# Patient Record
Sex: Female | Born: 1965 | Race: Black or African American | Hispanic: No | Marital: Married | Smoking: Never smoker
Health system: Southern US, Community
[De-identification: ages and names within clinical notes are randomized; demographics above are authoritative.]

## PROBLEM LIST (undated history)

## (undated) DIAGNOSIS — R519 Headache, unspecified: Secondary | ICD-10-CM

## (undated) DIAGNOSIS — Z923 Personal history of irradiation: Secondary | ICD-10-CM

## (undated) DIAGNOSIS — N939 Abnormal uterine and vaginal bleeding, unspecified: Secondary | ICD-10-CM

## (undated) DIAGNOSIS — R011 Cardiac murmur, unspecified: Secondary | ICD-10-CM

## (undated) DIAGNOSIS — R51 Headache: Secondary | ICD-10-CM

## (undated) DIAGNOSIS — G4730 Sleep apnea, unspecified: Secondary | ICD-10-CM

## (undated) DIAGNOSIS — G4733 Obstructive sleep apnea (adult) (pediatric): Secondary | ICD-10-CM

## (undated) DIAGNOSIS — F32A Depression, unspecified: Secondary | ICD-10-CM

## (undated) DIAGNOSIS — F329 Major depressive disorder, single episode, unspecified: Secondary | ICD-10-CM

## (undated) DIAGNOSIS — Z803 Family history of malignant neoplasm of breast: Secondary | ICD-10-CM

## (undated) DIAGNOSIS — G43909 Migraine, unspecified, not intractable, without status migrainosus: Secondary | ICD-10-CM

## (undated) DIAGNOSIS — G5600 Carpal tunnel syndrome, unspecified upper limb: Secondary | ICD-10-CM

## (undated) DIAGNOSIS — I10 Essential (primary) hypertension: Secondary | ICD-10-CM

## (undated) DIAGNOSIS — D649 Anemia, unspecified: Secondary | ICD-10-CM

## (undated) DIAGNOSIS — C801 Malignant (primary) neoplasm, unspecified: Secondary | ICD-10-CM

## (undated) HISTORY — PX: ABLATION: SHX5711

## (undated) HISTORY — PX: OTHER SURGICAL HISTORY: SHX169

## (undated) HISTORY — DX: Depression, unspecified: F32.A

## (undated) HISTORY — DX: Headache: R51

## (undated) HISTORY — DX: Essential (primary) hypertension: I10

## (undated) HISTORY — DX: Migraine, unspecified, not intractable, without status migrainosus: G43.909

## (undated) HISTORY — DX: Cardiac murmur, unspecified: R01.1

## (undated) HISTORY — DX: Family history of malignant neoplasm of breast: Z80.3

## (undated) HISTORY — DX: Headache, unspecified: R51.9

## (undated) HISTORY — DX: Anemia, unspecified: D64.9

## (undated) HISTORY — DX: Sleep apnea, unspecified: G47.30

## (undated) HISTORY — DX: Carpal tunnel syndrome, unspecified upper limb: G56.00

## (undated) HISTORY — DX: Obstructive sleep apnea (adult) (pediatric): G47.33

---
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
## 1998-07-08 ENCOUNTER — Other Ambulatory Visit: Admission: RE | Admit: 1998-07-08 | Discharge: 1998-07-08 | Payer: Self-pay | Admitting: Obstetrics and Gynecology

## 1999-08-02 ENCOUNTER — Other Ambulatory Visit: Admission: RE | Admit: 1999-08-02 | Discharge: 1999-08-02 | Payer: Self-pay | Admitting: Obstetrics and Gynecology

## 2000-07-04 ENCOUNTER — Emergency Department (HOSPITAL_COMMUNITY): Admission: EM | Admit: 2000-07-04 | Discharge: 2000-07-04 | Payer: Self-pay | Admitting: Emergency Medicine

## 2000-07-04 ENCOUNTER — Encounter: Payer: Self-pay | Admitting: Emergency Medicine

## 2000-09-05 ENCOUNTER — Other Ambulatory Visit: Admission: RE | Admit: 2000-09-05 | Discharge: 2000-09-05 | Payer: Self-pay | Admitting: Obstetrics and Gynecology

## 2001-10-15 ENCOUNTER — Other Ambulatory Visit: Admission: RE | Admit: 2001-10-15 | Discharge: 2001-10-15 | Payer: Self-pay | Admitting: Obstetrics and Gynecology

## 2001-12-27 ENCOUNTER — Encounter (INDEPENDENT_AMBULATORY_CARE_PROVIDER_SITE_OTHER): Payer: Self-pay | Admitting: *Deleted

## 2001-12-27 ENCOUNTER — Ambulatory Visit (HOSPITAL_COMMUNITY): Admission: RE | Admit: 2001-12-27 | Discharge: 2001-12-27 | Payer: Self-pay | Admitting: Obstetrics and Gynecology

## 2002-10-21 ENCOUNTER — Other Ambulatory Visit: Admission: RE | Admit: 2002-10-21 | Discharge: 2002-10-21 | Payer: Self-pay | Admitting: Obstetrics and Gynecology

## 2003-07-04 ENCOUNTER — Inpatient Hospital Stay (HOSPITAL_COMMUNITY): Admission: AD | Admit: 2003-07-04 | Discharge: 2003-07-04 | Payer: Self-pay | Admitting: Obstetrics and Gynecology

## 2003-08-28 ENCOUNTER — Ambulatory Visit (HOSPITAL_COMMUNITY): Admission: RE | Admit: 2003-08-28 | Discharge: 2003-08-28 | Payer: Self-pay | Admitting: Obstetrics and Gynecology

## 2004-01-19 ENCOUNTER — Inpatient Hospital Stay (HOSPITAL_COMMUNITY): Admission: RE | Admit: 2004-01-19 | Discharge: 2004-01-22 | Payer: Self-pay | Admitting: Obstetrics and Gynecology

## 2004-01-19 ENCOUNTER — Encounter (INDEPENDENT_AMBULATORY_CARE_PROVIDER_SITE_OTHER): Payer: Self-pay | Admitting: *Deleted

## 2004-02-03 ENCOUNTER — Encounter: Admission: RE | Admit: 2004-02-03 | Discharge: 2004-03-04 | Payer: Self-pay | Admitting: Obstetrics and Gynecology

## 2004-05-10 ENCOUNTER — Encounter: Admission: RE | Admit: 2004-05-10 | Discharge: 2004-06-27 | Payer: Self-pay | Admitting: Family Medicine

## 2005-01-02 ENCOUNTER — Other Ambulatory Visit: Admission: RE | Admit: 2005-01-02 | Discharge: 2005-01-02 | Payer: Self-pay | Admitting: Obstetrics and Gynecology

## 2006-03-28 ENCOUNTER — Other Ambulatory Visit: Admission: RE | Admit: 2006-03-28 | Discharge: 2006-03-28 | Payer: Self-pay | Admitting: Obstetrics and Gynecology

## 2007-02-06 ENCOUNTER — Encounter: Admission: RE | Admit: 2007-02-06 | Discharge: 2007-02-06 | Payer: Self-pay | Admitting: Obstetrics and Gynecology

## 2009-04-22 ENCOUNTER — Ambulatory Visit (HOSPITAL_COMMUNITY): Admission: RE | Admit: 2009-04-22 | Discharge: 2009-04-22 | Payer: Self-pay | Admitting: Obstetrics and Gynecology

## 2010-11-05 ENCOUNTER — Encounter: Payer: Self-pay | Admitting: Obstetrics and Gynecology

## 2011-03-03 NOTE — Discharge Summary (Signed)
NAME:  Kara [name]                              ACCOUNT NO.:  [id number]   MEDICAL RECORD NO.:  [id number]                   PATIENT TYPE:  INP   LOCATION:  9110                                 FACILITY:  [facility]   PHYSICIAN:  Naima A. Dillard, M.D.              DATE OF BIRTH:  06-Dec-1965   DATE OF ADMISSION:  01/19/2004  DATE OF DISCHARGE:                                 DISCHARGE SUMMARY   ADMISSION DIAGNOSES:  1. Intrauterine pregnancy at term.  2. Previous cesarean section.  3. Desires repeat cesarean section.  4. Desire sterilization.   DISCHARGE DIAGNOSES:  1. Intrauterine pregnancy at term.  2. Previous cesarean section.  3. Desires repeat cesarean section.  4. Desire sterilization.  5. Status post cesarean section and bilateral tubal ligation.  6. Breastfeeding.   PROCEDURES THIS ADMISSION:  Repeat low transverse cesarean section and  bilateral tubal ligation by Dr. Leonard Schwartz and Vance Gather  Duplantis, C.N.M. on January 19, 2004.   HOSPITAL COURSE:  Kara [name] is a 45 year old single black female gravida 4  para 1-0-2-1 at [redacted] weeks gestation who presented for a repeat cesarean  section and underwent the same for delivery of a viable female infant who  weighed 7 pounds 4 ounces and had Apgars of 9 and 9 on January 19, 2004  attended in delivery by Dr. Leonard Schwartz and Vance Gather Duplantis,  C.N.M.  The patient also desired bilateral tubal ligation for sterilization  and underwent the same at the time of the cesarean section.  Postoperatively  the patient has done well.  She is ambulating, voiding, and eating without  difficulty.  Her vital signs have been stable and she had been afebrile  throughout her postoperative stay.  She is currently breastfeeding without  difficulty and her pain is well managed with p.o. medications.  She is  deemed ready for discharge today.   DISCHARGE INSTRUCTIONS:  As per the [facility] handout.   DISCHARGE MEDICATIONS:  1. Motrin 600 mg p.o. q.6h. p.r.n. for pain.  2. Tylox one to two p.o. q.4-6h. p.r.n. for pain.  3. Prenatal vitamins daily.   DISCHARGE LABORATORY DATA:  Her hemoglobin is 10.6; wbc count is 8.4; and  platelets are 258,000.   FOLLOW-UP:  Her discharge follow-up will be at [facility] in 4-  6 weeks or p.r.n.     Concha Pyo. Duplantis, C.N.M.              Naima A. Normand Sloop, M.D.    SJD/MEDQ  D:  01/22/2004  T:  01/22/2004  Job:  [id number]

## 2011-03-03 NOTE — H&P (Signed)
[facility] of [facility]  Patient:    Kara [name], Kara [name] Visit Number: [id number] MRN: [id number]          Service Type: Attending:  Janine Limbo, M.D. Dictated by:   Janine Limbo, M.D. Adm. Date:  12/27/01                           History and Physical  HISTORY OF PRESENT ILLNESS:   Kara [name] is a 45 year old female, para 2-0-1-2, who presents for diagnostic laparoscopy.  She has had negative cultures of her cervix.  Her most recent Pap smear was in December 2002, and it was within normal limits.  OBSTETRICAL HISTORY:          The patient had a Cesarean section in 1994.  She had a term vaginal delivery in 1997.  She had an elective pregnancy termination in 1995.  PAST MEDICAL HISTORY:         The patient denies hypertension and diabetes.  DRUG ALLERGIES:               None known.  REVIEW OF SYSTEMS:            The patient does complain of dyspareunia.  SOCIAL HISTORY:               The patient denies cigarette use, alcohol use, and recreational drug use.  FAMILY HISTORY:               The patients mother has hypertension.  PHYSICAL EXAMINATION:  VITAL SIGNS:                  Weight is 155 pounds.  HEENT:                        Within normal limits.  CHEST:                        Clear.  CARDIAC:                      Regular rate and rhythm.  BREASTS:                      Without masses.  ABDOMEN:                      Nontender.  EXTREMITIES:                  Within normal limits.  NEUROLOGIC:                   Grossly normal.  PELVIC:                       External genitalia is normal.  The vagina is normal.  Cervix is nontender.  Uterus is normal size, shape, and consistency. Adnexa:  No masses.  ASSESSMENT:                   Pelvic pain with dyspareunia.  PLAN:                         The patient will undergo a diagnostic laparoscopy.  She understands the indications for her procedure and she accepts the risks of, but not limited to,  anesthetic complications, bleeding, infection, and possible damage to the  surrounding organs.  She understands that no guarantees can be given concerning the total relief of her discomfort. Dictated by:   Janine Limbo, M.D. Attending:  Janine Limbo, M.D. DD:  12/27/01 TD:  12/27/01 Job: [id number]

## 2011-03-03 NOTE — H&P (Signed)
[name], [name]                                ACCOUNT NO.:  [id number]   MEDICAL RECORD NO.:  [id number]                   PATIENT TYPE:   LOCATION:                                       FACILITY:   PHYSICIAN:  Janine Limbo, M.D.            DATE OF BIRTH:   DATE OF ADMISSION:  DATE OF DISCHARGE:                                HISTORY & PHYSICAL   DATE OF SURGERY:  January 19, 2004.   HISTORY OF PRESENT ILLNESS:  Ms. Kara [name] is a 45 year old female, gravida 4,  para 1-0-2-1, who presents at [redacted] weeks gestation [facility] January 17, 2004) for  repeat cesarean section and tubal ligation.  The patient has been followed  at the [facility] and Gynecology Division of [facility].  The pregnancy has been complicated by the fact that  her age is greater than 35.  An amniocentesis was performed and she was  found to have normal chromosomes.  In addition, the patient had had  significant emotional difficulty during this pregnancy.  She has been  followed by a psychiatrist and has been under their care.  She has had  various and sundry pain during the pregnancy.  She has done better more  recently.  She had initially planned for an attempt at a vaginal birth but  because she had gone beyond her due date and has not labored she has decided  to proceed with a repeat cesarean section.   OBSTETRICAL HISTORY:  The patient had a low transverse cesarean section in  1994 where she delivered a 6 pound 7 ounce female infant at term.  That infant  was in a breech position.  In 1995 and again in 1997, the patient had an  elective first trimester pregnancy termination.   DRUG ALLERGIES:  None known.   PAST MEDICAL HISTORY:  The patient had a broken ankle from a basketball  injury.  She has had an exploratory laparotomy in the past.  The patient is  currently taking Prozac 20 mg each day.   SOCIAL HISTORY:  The patient denies cigarette use, alcohol use, and  recreational drug  use.   REVIEW OF SYSTEMS:  The patient had normal pregnancy complaints.  In  addition, she complains of poor appetite.  She also complains of carpal  tunnel syndrome.   FAMILY HISTORY:  Noncontributory.   PHYSICAL EXAMINATION:  VITAL SIGNS:  Weight is 182 pounds.  HEENT:  Within normal limits.  CHEST:  Clear.  HEART:  Regular rate and rhythm.  ABDOMEN:  Gravid with a fundal height of 36 cm.  EXTREMITIES:  Within normal limits.  NEUROLOGICAL:  Grossly normal.  PELVIC:  The cervix is long and closed.   LABORATORY VALUES:  Blood type is O positive, antibody screen negative,  sickle cell negative, VDRL nonreactive, rubella immune, HBsAg negative, HIV  nonreactive, cystic fibrosis is  negative.  Third trimester gonorrhea is  negative, third trimester Chlamydia is negative, third trimester beta strep  is negative.   ASSESSMENT:  1. At [redacted] weeks gestation.  2. Prior cesarean section.  3. Desires repeat cesarean section and tubal ligation.   PLAN:  The patient will undergo a repeat low transverse cesarean section and  tubal ligation.  She understands the indications for her procedure and she  accepts the risks of, but not limited to, anesthetic complications,  bleeding, infections, possible damage to the surrounding organs, and  possible tubal failure (17:1000).                                               Janine Limbo, M.D.    AVS/MEDQ  D:  01/14/2004  T:  01/14/2004  Job:  [id number]

## 2011-03-03 NOTE — Op Note (Signed)
[facility] of [facility]  Patient:    Kara [name], Kara [name] Visit Number: [id number] MRN: [id number]          Service Type: DSU Location: [facility] Attending Physician:  Leonard Schwartz Dictated by:   Janine Limbo, M.D. Proc. Date: 12/27/01 Admit Date:  12/27/2001                             Operative Report  PREOPERATIVE DIAGNOSES:       1. Pelvic pain.                               2. Dyspareunia.  POSTOPERATIVE DIAGNOSES:      1. Pelvic pain.                               2. Dyspareunia.                               3. Pelvic adhesive disease.                               4. Fibroid uterus.                               5. Rule out endometriosis.  PROCEDURES:                   1. Diagnostic laparoscopy.                               2. Laparoscopic lysis of adhesions.                               3. Laparoscopic pelvic biopsies.                               4. Chromopertubation.  SURGEON:                      Janine Limbo, M.D.  ANESTHESIA:                   General.  INDICATIONS:                  The patient is a 45 year old female who presents with the above-mentioned diagnoses.  She understands the indications for her procedure and she accepts the risks of (but not limited to) anesthetic complications, bleeding, infection, and possible damage to surrounding organs. She understands that no guarantees can be given concerning the total relief of her pelvic discomfort.  FINDINGS:                     The uterus was upper limits of normal size. There were several subserosal fibroids measuring 1 cm.  The ovaries were normal bilaterally.  There were adhesions between the right ovary and the right posterior uterus.  There were also adhesions that were moderate between the right ovary and the right pelvic side wall.  The fallopian tubes were normal.  The  fallopian tubes did quickly fill with dye on chromopertubation, and dye was noted to easily  spill from the delicate fimbriated ends of both tubes.  There was a peritoneal window in the right posterior cul-de-sac measuring approximately 2 cm in size.  We must rule out endometriosis.  The appendix appeared normal except for filmy adhesions.  The liver appeared normal.  The bowel appeared normal.  There were hyperemic blood vessels present in the posterior cul-de-sac and in the anterior cul-de-sac.  The re was a question of old pelvic inflammatory disease.  There was no other evidence of endometriosis present.  DESCRIPTION OF PROCEDURE:     The patient was taken to the operating room, where a general anesthetic was given.  The patients abdomen, perineum and vagina were prepped with multiple layers of Betadine.  A Foley catheter was placed in the bladder.  Examination under anesthesia was performed.  A Hulka tenaculum was placed inside the uterus.  The patient was sterilely draped. The subumbilical area was injected with 5 cc of 0.5% Marcaine.  An incision was made and a Veress needle was inserted into the abdominal cavity without difficulty.  Proper placement was confirmed using the saline drop test. Pneumoperitoneum was then obtained.  The laparoscopic trocar and then the laparoscope were substituted for the Veress needle.  The pelvic structures were visualized with findings as mentioned above.  The right lower quadrant was injected with 3 cc of 0.5% Marcaine.  An incision was made and a 5 mm trocar was placed suprapubically under direct visualization.  Pictures were taken of the patients pelvic anatomy.  We then used the bipolar cautery to cauterize the adhesions in the pelvis and we then used sharp dissection to remove the adhesions.  We injected the peritoneal window in the posterior cul-de-sac with normal saline.  We then biopsied the peritoneal window sharply.  Hemostasis was noted to be adequate.  Care was taken not to damage any of the bowel or any of the vital underlying  structures.  There were filmy adhesions encapsulating the right ovary, and these adhesions were sharply removed and sent to pathology.  Hemostasis was indeed noted to be adequate. The ureters were identified and were noted to be without harm as best we could tell.  The Hulka tenaculum was removed and an acorn cannula was placed. Chromopertubation was then performed and dye quickly filled the fallopian tubes bilaterally and quickly spilled from the fimbriated ends.  We then aspirated the dye from the pelvis and the pelvis was irrigated.  We felt at this time we were ready to terminate our procedure.  The pneumoperitoneum was allowed to escape.  The instruments were removed under direct visualization. We carefully inspected the bowel before removing the subumbilical trocar and the laparoscope.  There was no evidence of trocar damage.  All instruments were then removed.  The incisions were closed using deep and superficial sutures of 4-0 Vicryl.  Sponge, needle and instrument counts were correct on two occasions.  Estimated blood loss was 10 cc.  The patient tolerated her procedure well.  She was awakened from her anesthetic and taken to the recovery room in stable condition.  The patient was given Toradol 30 mg IV prior to termination of her procedure.  She was given Cefotan 1 g IV at the beginning of her procedure.  FOLLOW-UP INSTRUCTIONS:       The patient will return to see Dr. Stefano Gaul in 2-3 weeks for follow-up examination.  She was given a copy  of the postoperative instruction sheet as prepared by the [facility] for patients who have undergone laparoscopy.  Vicodin, 1-2 tablets every four hours was called to [facility] on [address].  A total number of 30 tablets was ordered and one refill was authorized.  The patient is scheduled to return to work on December 30, 2001. Dictated by:   Janine Limbo, M.D. Attending Physician:  Leonard Schwartz DD:   12/27/01 TD:  12/28/01 Job: [id number]

## 2011-03-03 NOTE — Op Note (Signed)
NAME:  Kara [name], Kara [name]                              ACCOUNT NO.:  [id number]   MEDICAL RECORD NO.:  [id number]                   PATIENT TYPE:  INP   LOCATION:  9110                                 FACILITY:  [facility]   PHYSICIAN:  Janine Limbo, M.D.            DATE OF BIRTH:  02-Apr-1966   DATE OF PROCEDURE:  01/19/2004  DATE OF DISCHARGE:                                 OPERATIVE REPORT   PREOPERATIVE DIAGNOSES:  1. Term intrauterine pregnancy.  2. Prior cesarean section.  3. Desires repeat cesarean section.  4. Desires sterilization.   POSTOPERATIVE DIAGNOSES:  1. Term intrauterine pregnancy.  2. Prior cesarean section.  3. Desires repeat cesarean section.  4. Desires sterilization.  5. Questionable fibroid on left fallopian tube.   PROCEDURES:  1. Repeat low transverse cesarean section.  2. Bilateral tubal ligation.   SURGEON:  Janine Limbo, M.D.   FIRST ASSISTANT:  Concha Pyo. Duplantis, C.N.M.   ANESTHESIA:  Spinal.   DISPOSITION:  Ms. Kara [name] is a 45 year old female, gravida 4, para 1-0-2-1, who  presents at 105 weeks' gestation ([facility] is January 17, 2004) for repeat cesarean  section and tubal ligation.  She has been followed at the [facility] and Gynecology Division of [facility].  This  pregnancy has been largely uncomplicated.  She understands the indications  for her surgical procedure and she accepts the risks of, but not limited to,  anesthetic complications, bleeding, infections, possible damage to the  surrounding organs, and possible tubal failure (17 per 1000).   FINDINGS:  A 7 pound 4 ounce female infant (name currently not known) was  delivered from the cephalic presentation.  The Apgars were 9 at one minute  and 9 at five minutes.  There was a firm nodule beneath the left fallopian  tube measuring approximately 0.5 cm in size, and this was consistent with a  fibroid.  The fallopian tubes and the ovaries were otherwise  normal.  The  uterus was otherwise normal for the gravid state.   PROCEDURE:  The patient was taken to the operating room, where a spinal  anesthetic was given.  The patient's abdomen and perineum were prepped with  multiple layers of Betadine.  A Foley catheter was placed in the bladder.  The patient was then sterilely draped.  The lower abdomen was injected with  10 mL of 0.25% Marcaine with epinephrine.  A low transverse incision was  made in the abdomen and carried sharply through the subcutaneous tissue, the  fascia, and the anterior peritoneum.  An incision was made in the lower  uterine segment and the bladder flap was developed.  The incision was then  extended in a low transverse fashion in the lower uterus.  The fetal head  was delivered without difficulty.  The mouth and nose were suctioned.  The  remainder of the infant was  then delivered.  The cord was clamped and cut  and the infant was handed to the waiting pediatrics team.  Routine cord  blood studies were obtained.  The placenta was removed.  The uterine cavity  was cleaned of amniotic fluid, clotted blood ,and membranes.  The uterine  incision was then closed using a running locking suture of 2-0 Vicryl,  followed by figure-of-eight sutures of 2-0 Vicryl for hemostasis.  Hemostasis was adequate.  The pelvis was vigorously irrigated.  The left  fallopian tube was identified and the fibroid as mentioned above was noted.  The fibroid was removed along with a portion of that left fallopian tube.  A  knuckle of tube was made on that left side using 0 plain catgut, followed by  suture ligature of 0 plain catgut.  Hemostasis was adequate after the  knuckle of tube was excised.  An identical procedure was carried out on the  opposite side.  Once again hemostasis was noted to be adequate.  The  anterior peritoneum and the abdominal musculature were reapproximated in the  midline using 3-0 Vicryl.  The abdominal musculature and the  fascia were  irrigated.  Hemostasis was adequate.  The fascia was closed using a running  suture of 0 Vicryl followed by three interrupted sutures of 0 Vicryl.  The  subcutaneous layer was closed using a running suture of 3-0 Vicryl.  The  skin was reapproximated using a subcuticular suture of 3-0 Monocryl.  Sponge, needle, and instrument counts were correct on two occasions.  The  estimated blood loss for the procedure was 800 mL.  The patient tolerated  her procedure well.  She was awakened from her anesthetic and taken to the  recovery room in stable condition.  The infant was taken to the full-term  nursery in stable condition.                                               Janine Limbo, M.D.    AVS/MEDQ  D:  01/19/2004  T:  01/20/2004  Job:  [id number]

## 2012-01-17 ENCOUNTER — Other Ambulatory Visit (HOSPITAL_COMMUNITY): Payer: Self-pay | Admitting: Family Medicine

## 2012-01-17 ENCOUNTER — Ambulatory Visit (HOSPITAL_COMMUNITY)
Admission: RE | Admit: 2012-01-17 | Discharge: 2012-01-17 | Disposition: A | Discharge status: Home / Self Care | Payer: 59 | Source: Ambulatory Visit | Attending: Family Medicine | Admitting: Family Medicine

## 2012-01-17 ENCOUNTER — Ambulatory Visit (INDEPENDENT_AMBULATORY_CARE_PROVIDER_SITE_OTHER): Payer: 59 | Admitting: Obstetrics and Gynecology

## 2012-01-17 DIAGNOSIS — Z1231 Encounter for screening mammogram for malignant neoplasm of breast: Secondary | ICD-10-CM | POA: Insufficient documentation

## 2012-01-17 DIAGNOSIS — Z124 Encounter for screening for malignant neoplasm of cervix: Secondary | ICD-10-CM

## 2012-01-17 DIAGNOSIS — Z01419 Encounter for gynecological examination (general) (routine) without abnormal findings: Secondary | ICD-10-CM

## 2012-01-17 DIAGNOSIS — Z113 Encounter for screening for infections with a predominantly sexual mode of transmission: Secondary | ICD-10-CM

## 2013-01-16 ENCOUNTER — Other Ambulatory Visit: Payer: Self-pay

## 2013-01-16 DIAGNOSIS — Z1231 Encounter for screening mammogram for malignant neoplasm of breast: Secondary | ICD-10-CM

## 2013-02-10 ENCOUNTER — Ambulatory Visit
Admission: RE | Admit: 2013-02-10 | Discharge: 2013-02-10 | Disposition: A | Discharge status: Home / Self Care | Payer: 59 | Source: Ambulatory Visit

## 2013-02-10 DIAGNOSIS — Z1231 Encounter for screening mammogram for malignant neoplasm of breast: Secondary | ICD-10-CM

## 2013-02-24 ENCOUNTER — Other Ambulatory Visit: Payer: Self-pay | Admitting: Family Medicine

## 2013-02-24 ENCOUNTER — Other Ambulatory Visit (HOSPITAL_COMMUNITY)
Admission: RE | Admit: 2013-02-24 | Discharge: 2013-02-24 | Disposition: A | Discharge status: Home / Self Care | Payer: 59 | Source: Ambulatory Visit | Attending: Family Medicine | Admitting: Family Medicine

## 2013-02-24 DIAGNOSIS — Z1151 Encounter for screening for human papillomavirus (HPV): Secondary | ICD-10-CM | POA: Insufficient documentation

## 2013-02-24 DIAGNOSIS — Z01419 Encounter for gynecological examination (general) (routine) without abnormal findings: Secondary | ICD-10-CM | POA: Insufficient documentation

## 2013-03-09 ENCOUNTER — Ambulatory Visit (INDEPENDENT_AMBULATORY_CARE_PROVIDER_SITE_OTHER): Payer: 59 | Admitting: Emergency Medicine

## 2013-03-09 VITALS — BP 133/76 | HR 71 | Temp 98.0°F | Resp 16 | Ht 65.5 in | Wt 180.2 lb

## 2013-03-09 DIAGNOSIS — M5431 Sciatica, right side: Secondary | ICD-10-CM

## 2013-03-09 DIAGNOSIS — G5600 Carpal tunnel syndrome, unspecified upper limb: Secondary | ICD-10-CM

## 2013-03-09 DIAGNOSIS — M5430 Sciatica, unspecified side: Secondary | ICD-10-CM

## 2013-03-09 DIAGNOSIS — G5601 Carpal tunnel syndrome, right upper limb: Secondary | ICD-10-CM

## 2013-03-09 MED ORDER — NAPROXEN SODIUM 550 MG PO TABS: 550.0000 mg | ORAL_TABLET | Freq: Two times a day (BID) | ORAL | Status: AC

## 2013-03-09 MED ORDER — CYCLOBENZAPRINE HCL 10 MG PO TABS: 10.0000 mg | ORAL_TABLET | Freq: Three times a day (TID) | ORAL | Status: DC | PRN

## 2013-03-09 MED ORDER — NAPROXEN SODIUM 550 MG PO TABS: 550.0000 mg | ORAL_TABLET | Freq: Two times a day (BID) | ORAL | Status: DC

## 2013-03-09 NOTE — Progress Notes (Signed)
[facility] [address], [place] [address] [phone]  Date:  03/09/2013   Name:  Kara [name]   DOB:  April 28, 1966   MRN:  [id number]  PCP:  Beverley Fiedler, MD    Chief Complaint: Hand Pain and Knee Pain   History of Present Illness:  Kara [name] is a 47 y.o. very pleasant female patient who presents with the following:  Two complaints that are unrelated.  Over the past week she notes a deep aching pain that has a burning characteristic to it in her right anterolateral thigh radiating down from her right hip.  Worse with walking and weight bearing. No history of injury or overuse.  No history of prior back injury or arthritis or inflammatory joint disease.  No improvement with over the counter medications or other home remedies. No numbness, tingling, or weakness in leg.  Has persistent numbness and pain in right hand while sleeping.  Denies injury or repetitive motion activities such as knitting, sewing or computer use.  No history of carpal tunnel syndrome.  Says discomfort is only at night.  Denies other complaint or health concern today.   There are no active problems to display for this patient.   Past Medical History  Diagnosis Date  . Anemia     No past surgical history on file.  History  Substance Use Topics  . Smoking status: Never Smoker   . Smokeless tobacco: Not on file  . Alcohol Use: Yes    Family History  Problem Relation Age of Onset  . Hypertension Mother     No Known Allergies  Medication list has been reviewed and updated.  No current outpatient prescriptions on file prior to visit.   No current facility-administered medications on file prior to visit.    Review of Systems:  As per HPI, otherwise negative.    Physical Examination: Filed Vitals:   03/09/13 1052  BP: 133/76  Pulse: 71  Temp: 98 F (36.7 C)  Resp: 16   Filed Vitals:   03/09/13 1052  Height: 5' 5.5" (1.664 m)  Weight: 180 lb 3.2 oz  (81.738 kg)   Body mass index is 29.52 kg/(m^2). Ideal Body Weight: Weight in (lb) to have BMI = 25: 152.2   GEN: WDWN, NAD, Non-toxic, Alert & Oriented x 3 HEENT: Atraumatic, Normocephalic.  Ears and Nose: No external deformity. EXTR: No clubbing/cyanosis/edema NEURO: Normal gait.  PSYCH: Normally interactive. Conversant. Not depressed or anxious appearing.  Calm demeanor.  Right hand and wrist:  No muscle wasting.  tinnel and phalen positive  Motor normal Back:  No tenderness neurologic intact.   Normal vascular examination  Assessment and Plan: Sciatic neuritis Carpal tunnel syndrome Wrist splint Flexeril Anaprox Follow up in one week  Signed,  Phillips Odor, MD

## 2013-03-09 NOTE — Patient Instructions (Addendum)

## 2013-03-10 NOTE — Progress Notes (Signed)
Reviewed and agree.

## 2014-01-23 ENCOUNTER — Encounter: Payer: Self-pay | Admitting: Podiatry

## 2014-01-23 ENCOUNTER — Ambulatory Visit (INDEPENDENT_AMBULATORY_CARE_PROVIDER_SITE_OTHER): Payer: 59

## 2014-01-23 ENCOUNTER — Ambulatory Visit (INDEPENDENT_AMBULATORY_CARE_PROVIDER_SITE_OTHER): Payer: 59 | Admitting: Podiatry

## 2014-01-23 VITALS — BP 132/75 | HR 60 | Resp 16 | Ht 65.0 in | Wt 175.0 lb

## 2014-01-23 DIAGNOSIS — M779 Enthesopathy, unspecified: Secondary | ICD-10-CM

## 2014-01-23 DIAGNOSIS — M2040 Other hammer toe(s) (acquired), unspecified foot: Secondary | ICD-10-CM

## 2014-01-23 MED ORDER — TRIAMCINOLONE ACETONIDE 10 MG/ML IJ SUSP
10.0000 mg | Freq: Once | INTRAMUSCULAR | Status: AC
  Administered 2014-01-23: 10 mg

## 2014-01-23 NOTE — Progress Notes (Signed)
Subjective:     Patient ID: Kara [name], female   DOB: 1966-08-27, 48 y.o.   MRN: [id number]  HPI patient has not been seen in a number of years and presents with pain in the right fifth toe with inflammation and fluid buildup secondary to different shoe gear over the last several months   Review of Systems  All other systems reviewed and are negative.      Objective:   Physical Exam  Nursing note and vitals reviewed. Constitutional: She is oriented to person, place, and time.  Cardiovascular: Intact distal pulses.   Musculoskeletal: Normal range of motion.  Neurological: She is oriented to person, place, and time.  Skin: Skin is warm.   neurovascular status intact with muscle strength adequate and normal range of motion subtalar midtarsal joint. Fill time to the digits within normal limits and arch height was normal with discomfort fifth digit right foot with keratotic lesion at the interphalangeal joint and fluid buildup     Assessment:     Probable inflammatory condition with keratotic lesion formation and fluid buildup within the interphalangeal joint    Plan:     H&P and x-ray reviewed. Did careful interphalangeal joint injection right 3 mg dexamethasone Kenalog and debrided the lesion fully and reappoint her recheck

## 2014-01-23 NOTE — Progress Notes (Signed)
   Subjective:    Patient ID: Kara [name], female    DOB: 08-21-1966, 48 y.o.   MRN: [id number]  HPI Comments: The little pinky toe has a callus on it where i had my sutures in it, and it is starting to bother me     Review of Systems  All other systems reviewed and are negative.      Objective:   Physical Exam        Assessment & Plan:

## 2014-07-16 ENCOUNTER — Encounter: Payer: Self-pay | Admitting: General Surgery

## 2014-07-16 DIAGNOSIS — G4733 Obstructive sleep apnea (adult) (pediatric): Secondary | ICD-10-CM

## 2014-07-16 DIAGNOSIS — R011 Cardiac murmur, unspecified: Secondary | ICD-10-CM | POA: Insufficient documentation

## 2014-07-16 DIAGNOSIS — I10 Essential (primary) hypertension: Secondary | ICD-10-CM | POA: Insufficient documentation

## 2014-08-11 ENCOUNTER — Ambulatory Visit: Payer: 59 | Admitting: Podiatry

## 2014-08-26 ENCOUNTER — Other Ambulatory Visit: Payer: Self-pay

## 2014-08-26 DIAGNOSIS — Z1231 Encounter for screening mammogram for malignant neoplasm of breast: Secondary | ICD-10-CM

## 2014-09-14 ENCOUNTER — Ambulatory Visit
Admission: RE | Admit: 2014-09-14 | Discharge: 2014-09-14 | Disposition: A | Discharge status: Home / Self Care | Payer: 59 | Source: Ambulatory Visit

## 2014-09-14 DIAGNOSIS — Z1231 Encounter for screening mammogram for malignant neoplasm of breast: Secondary | ICD-10-CM

## 2014-10-08 ENCOUNTER — Telehealth: Payer: Self-pay | Admitting: Cardiology

## 2014-10-08 NOTE — Telephone Encounter (Signed)
Pt calling to discuss prescription for sleep apnea.  Advised will forward to Dr. Radford Pax and Everlean Alstrom to follow up.

## 2014-10-08 NOTE — Telephone Encounter (Signed)
New Message  Pt requested to speak with Rn about Sleep Apnea supply prescription. Pt has appt sched for Feb 1 with Dr. Radford Pax. Please call back and discuss.

## 2014-10-12 NOTE — Telephone Encounter (Signed)
Have I seen this patient?

## 2014-10-12 NOTE — Telephone Encounter (Signed)
Left message to call back  

## 2014-10-12 NOTE — Telephone Encounter (Signed)
I cannot give her a prescription for CPAP until I have seen her - who ordered her CPAP in the past

## 2014-10-12 NOTE — Telephone Encounter (Signed)
Pt has "New Patient" OV on 11/16/14.

## 2014-10-13 ENCOUNTER — Ambulatory Visit: Payer: 59 | Admitting: Cardiology

## 2014-10-14 NOTE — Telephone Encounter (Signed)
Left message to call back  

## 2014-10-15 NOTE — Telephone Encounter (Signed)
Patient was told from insurance company she needs a face to face OV before replacement CPAP supplies can be ordered. Patient st she saw Dr. Radford Pax once at the [place] office.  Patient has appt with Dr. Radford Pax 11/16/14. Will call [facility] to get last OV note.

## 2014-11-02 ENCOUNTER — Ambulatory Visit (INDEPENDENT_AMBULATORY_CARE_PROVIDER_SITE_OTHER): Payer: 59 | Admitting: Neurology

## 2014-11-02 ENCOUNTER — Encounter: Payer: Self-pay | Admitting: Neurology

## 2014-11-02 VITALS — BP 120/74 | HR 73 | Ht 64.0 in | Wt 187.0 lb

## 2014-11-02 DIAGNOSIS — G43709 Chronic migraine without aura, not intractable, without status migrainosus: Secondary | ICD-10-CM

## 2014-11-02 DIAGNOSIS — G5603 Carpal tunnel syndrome, bilateral upper limbs: Secondary | ICD-10-CM

## 2014-11-02 DIAGNOSIS — G5600 Carpal tunnel syndrome, unspecified upper limb: Secondary | ICD-10-CM | POA: Insufficient documentation

## 2014-11-02 DIAGNOSIS — R51 Headache: Secondary | ICD-10-CM

## 2014-11-02 DIAGNOSIS — G43909 Migraine, unspecified, not intractable, without status migrainosus: Secondary | ICD-10-CM | POA: Insufficient documentation

## 2014-11-02 DIAGNOSIS — G5602 Carpal tunnel syndrome, left upper limb: Secondary | ICD-10-CM

## 2014-11-02 DIAGNOSIS — G4452 New daily persistent headache (NDPH): Secondary | ICD-10-CM

## 2014-11-02 DIAGNOSIS — G5601 Carpal tunnel syndrome, right upper limb: Secondary | ICD-10-CM

## 2014-11-02 DIAGNOSIS — G4733 Obstructive sleep apnea (adult) (pediatric): Secondary | ICD-10-CM

## 2014-11-02 DIAGNOSIS — R519 Headache, unspecified: Secondary | ICD-10-CM | POA: Insufficient documentation

## 2014-11-02 MED ORDER — TOPIRAMATE ER 200 MG PO CAP24: 200.0000 mg | ORAL_CAPSULE | Freq: Every day | ORAL | Status: DC

## 2014-11-02 NOTE — Patient Instructions (Signed)
Overall you are doing fairly well but I do want to suggest a few things today:   Remember to drink plenty of fluid, eat healthy meals and do not skip any meals. Try to eat protein with a every meal and eat a healthy snack such as fruit or nuts in between meals. Try to keep a regular sleep-wake schedule and try to exercise daily, particularly in the form of walking, 20-30 minutes a day, if you can.   As far as your medications are concerned, I would like to suggest: Trokendi Start with 25mg  at night x 7 days Then 40mh at night x 7 days Then 100mg  at night x 7 days If tolerating can increase to 200mg  at night x 7 days  As far as diagnostic testing: MRI of the brain, labs today, emg/ncs  I would like to see you back in 3 months, sooner if we need to. Please call us with any interim questions, concerns, problems, updates or refill requests.   Please also call us for any test results so we can go over those with you on the phone.  My clinical assistant and will answer any of your questions and relay your messages to me and also relay most of my messages to you.   Our phone number is [phone]. We also have an after hours call service for urgent matters and there is a physician on-call for urgent questions. For any emergencies you know to call 911 or go to the nearest emergency room

## 2014-11-02 NOTE — Progress Notes (Signed)
[facility]    Provider:  Dr Jaynee Eagles Referring Provider: Radene Ou Bill Salinas, MD Primary Care Physician:  Milagros Evener, MD  CC:  Headache  HPI:  Kara [name] is a 49 y.o. female here as a referral from Dr. Radene Ou for headache  She is here for evaluation of migraines. Started more than 10 years ago.Are unilateral, has nausea, will wake up every morning with a headache at 2am and has a headache more pressure. She has sleep apnea but she has not followed up. The migraines are throbbing sometimes, pressure sometimes. They "come and go". Last week had a migraine, had to lay down in the dark, on the right side, throbbing, +nausea and phonophobia. These happen every other day and lasts one-two hours and takes naproxen and Naratriptan and it goes away, can be 10/10 pain. If it happens at work, she will take naproxen which helps. Unknown triggers. It is getting worse, more frequent in the last month. Also having daily all over tension type headaches, they have been severe 8/10. Sumatriptan didn't work. Has some blurriness when she has the headaches, otherwise no focal neurologic symptoms with the headaches. Is on Amitriptylline at night 25mg  at night was placed on that for the migraines.   She also has Carpal Tunnel Syndrome. She wears braces at night. She has numbness and tingling in the right fingers, right > left, wakes her up in the middle of the night with numbness. All the fingers. No weakness. No neck pain.   Review of Systems: Patient complains of symptoms per HPI as well as the following symptoms: weight gain, fatigue, snoring, anemia, constipation, memory loss, headache, numbness, decreased energy. Pertinent negatives per HPI. All others negative.   History   Social History  . Marital Status: Married    Spouse Name: [name]    Number of Children: 2  . Years of Education: college   Occupational History  .      [employer]   Social History Main Topics  . Smoking  status: Never Smoker   . Smokeless tobacco: Never Used  . Alcohol Use: 0.0 oz/week    0 Not specified per week  . Drug Use: No  . Sexual Activity: Yes    Birth Control/ Protection: None   Other Topics Concern  . Not on file   Social History Narrative   Patient lives at home with her husband and two children.   Patient works full time for [employer].   Education college.   Right handed.   Caffeine two cup daily coffee.    Family History  Problem Relation Age of Onset  . Hypertension Mother     Past Medical History  Diagnosis Date  . Anemia   . Migraines   . OSA (obstructive sleep apnea)     Mild OSA AHI 9.3/hr now on CPAP at 6CM H2O  . Hypertension   . Heart murmur   . HA (headache)     Past Surgical History  Procedure Laterality Date  . Tummy tuck    . Ablation    . Cesarean section      x2    Current Outpatient Prescriptions  Medication Sig Dispense Refill  . acetaminophen-codeine (TYLENOL #3) 300-30 MG per tablet Take 1 tablet by mouth every 6 (six) hours as needed. for pain  0  . amitriptyline (ELAVIL) 25 MG tablet Take 25 mg by mouth at bedtime. For migraine prevention    . hydrochlorothiazide (HYDRODIURIL) 25 MG tablet Take 25 mg by  mouth daily.    . naproxen (NAPROSYN) 500 MG tablet Take 500 mg by mouth 2 (two) times daily with a meal. As needed for pain    . naratriptan (AMERGE) 2.5 MG tablet Take 2.5 mg by mouth as needed for migraine.      No current facility-administered medications for this visit.    Allergies as of 11/02/2014 - Review Complete 11/02/2014  Allergen Reaction Noted  . Imitrex [sumatriptan] Other (See Comments) 07/16/2014    Vitals: BP 120/74 mmHg  Pulse 73  Ht 5\' 4"  (1.626 m)  Wt 187 lb (84.823 kg)  BMI 32.08 kg/m2 Last Weight:  Wt Readings from Last 1 Encounters:  11/02/14 187 lb (84.823 kg)   Last Height:   Ht Readings from Last 1 Encounters:  11/02/14 5\' 4"  (1.626 m)    Physical exam: Exam: Gen: NAD, conversant,  well nourised, overweight, well groomed                     CV: RRR, no MRG. No Carotid Bruits. No peripheral edema, warm, nontender Eyes: Conjunctivae clear without exudates or hemorrhage  Neuro: Detailed Neurologic Exam  Speech:    Speech is normal; fluent and spontaneous with normal comprehension.  Cognition:    The patient is oriented to person, place, and time;     recent and remote memory intact;     language fluent;     normal attention, concentration,     fund of knowledge Cranial Nerves:    The pupils are equal, round, and reactive to light. The fundi are normal and spontaneous venous pulsations are present. Visual fields are full to finger confrontation. Extraocular movements are intact. Trigeminal sensation is intact and the muscles of mastication are normal. The face is symmetric. The palate elevates in the midline. Hearing intact. Voice is normal. Shoulder shrug is normal. The tongue has normal motion without fasciculations.   Coordination:    Normal finger to nose and heel to shin. Normal rapid alternating movements.   Gait:    Heel-toe and tandem gait are normal.   Motor Observation:    No asymmetry, no atrophy, and no involuntary movements noted. Tone:    Normal muscle tone.    Posture:    Posture is normal. normal erect    Strength:    Strength is V/V in the upper and lower limbs.      Sensation: intact to LT     Reflex Exam:  DTR's:    Deep tendon reflexes in the upper and lower extremities are normal bilaterally.   Toes:    The toes are downgoing bilaterally.   Clonus:    Clonus is absent.      Assessment/Plan:  49 year old female with chronic migraines without aura, not intractable, without status. Neuro exam is normal.   New daily persistent headache and chronic migraines: Suggested increasing Amitriptyline(she is on 25mg  at night) but she prefersto try  topamax due to the weight loss side effects. Not planning on having children, had procedure  and can't get pregnant. Discussed teratogenic effects. Discussed side effects and dosing titration. Suggest MRi of the brain due to worsening headaches, no imaging of brain was ever completed - need a bmp for contrast Emg/ncs for the CTS OSA - needs to follow up with sleep physician as untreated OSA can cause headaches and serious other complications such as stroke, pulmonary HTN.    Provided Trokendi samples: Start at 25mg  qhs x 7 days and titrate  as directed 25mg  lot [id number] exp 01/2017 x7 pills 50mg  lot [id number] exp 03/2017 x 7 pills 100mg  [id number] 01/2015 x 7 pills 200mg  [id number] 04/2017 x 7 pills    Sarina Ill, MD  [facility] [address] [place], [place] [address]  Phone [phone] Fax [phone]

## 2014-11-03 LAB — BASIC METABOLIC PANEL
BUN/Creatinine Ratio: 19 (ref 9–23)
BUN: 16 mg/dL (ref 6–24)
CO2: 26 mmol/L (ref 18–29)
Calcium: 9.2 mg/dL (ref 8.7–10.2)
Chloride: 100 mmol/L (ref 97–108)
Creatinine, Ser: 0.84 mg/dL (ref 0.57–1.00)
GFR calc Af Amer: 96 mL/min/{1.73_m2} (ref >59)
GFR calc non Af Amer: 83 mL/min/{1.73_m2} (ref >59)
Glucose: 95 mg/dL (ref 65–99)
Potassium: 3.8 mmol/L (ref 3.5–5.2)
Sodium: 138 mmol/L (ref 134–144)

## 2014-11-09 ENCOUNTER — Ambulatory Visit (INDEPENDENT_AMBULATORY_CARE_PROVIDER_SITE_OTHER): Payer: 59 | Admitting: Neurology

## 2014-11-09 ENCOUNTER — Encounter: Payer: Self-pay | Admitting: Neurology

## 2014-11-09 ENCOUNTER — Ambulatory Visit (INDEPENDENT_AMBULATORY_CARE_PROVIDER_SITE_OTHER): Payer: Self-pay | Admitting: Neurology

## 2014-11-09 DIAGNOSIS — G5602 Carpal tunnel syndrome, left upper limb: Secondary | ICD-10-CM

## 2014-11-09 DIAGNOSIS — G5603 Carpal tunnel syndrome, bilateral upper limbs: Secondary | ICD-10-CM

## 2014-11-09 DIAGNOSIS — G5601 Carpal tunnel syndrome, right upper limb: Secondary | ICD-10-CM

## 2014-11-09 DIAGNOSIS — Z0289 Encounter for other administrative examinations: Secondary | ICD-10-CM

## 2014-11-09 DIAGNOSIS — R202 Paresthesia of skin: Secondary | ICD-10-CM

## 2014-11-09 NOTE — Procedures (Signed)
  [facility]    Provider:  Dr Jaynee Eagles Referring Provider: Radene Ou Bill Salinas, MD Primary Care Physician:  Milagros Evener, MD  History:  Kara [name] is a 49 y.o. female here for evaluation of hand pain. Symptoms started about a year ago. She was diagnosed with CTS. Despite wearing wrist splints, her symptoms have progressed. The right hand is much worse than the left. She wakes up at night with numbness in the fingers and tries to shake them out. She reports numbness in all the fingers with tingling in the tips. She has difficulty with fine-motor skills like putting on her makeup in the morning. Denies weakness. Denies neck pain.   Summary: Evaluation of the right median APB motor nerve showed prolonged distal onset latency (5.8 ms, N<4.0) with normal F response latency.  The right Median 2nd Digit sensory nerve showed prolonged distal peak latency (5.1 ms, N<3.9).   Evaluation of the left median APB motor nerve showed prolonged distal onset latency (4.2 ms, N<4.0) with normal F response latency..  The left Median 2nd Digit sensory nerve showed prolonged distal peak latency (4.4 ms, N<3.9).   The bilateral Ulnar ADM motor nerves were within normal limits with normal F response latencies.  The bilateral Ulnar 5th-digit sensory nerves were within normal limits  EMG needle evaluation of the right Opponens Pollicis muscle showed increased spontaneous activity, increased motor unit amplitude and diminished recruitment.  The following muscles were within normal limits: right Deltoid, right Triceps, right Pronator teres, right First Dorsal Interosseous, left Opponens Pollicis muscles.  Conclusion:  There is electrophysiologic evidence for severe right and moderately-severe left Carpal Tunnel Syndrome. No suggestion of cervical radiculopathy or polyneuropathy.   Kara Ill, MD  [facility] [address] [place], [place] [address]  Phone  [phone] Fax [phone]

## 2014-11-09 NOTE — Addendum Note (Signed)
Addended by: Sarina Ill B on: 11/09/2014 05:12 PM   Modules accepted: Orders

## 2014-11-12 NOTE — Progress Notes (Signed)
See procedure note.

## 2014-11-15 DIAGNOSIS — E669 Obesity, unspecified: Secondary | ICD-10-CM | POA: Insufficient documentation

## 2014-11-15 NOTE — Progress Notes (Signed)
Cardiology Office Note   Date:  11/16/2014   ID:  Kara [name], DOB 06-Nov-1965, MRN [id number]  PCP:  Milagros Evener, MD  Cardiologist:    Sueanne Margarita, MD   Chief Complaint  Patient presents with  . Sleep Apnea  . Hypertension      History of Present Illness: Kara [name] is a 49 y.o. female who presents for evaluation of OSA.  She has a history of mild OSA with AHI of 9.3/hr and was placed on CPAP at 6cm H2O.  She needs new supplies.  She is doing well with her CPAP therapy.  She tolerates the device and feels rested in the am.  She has no problems with excessive daytime sleepiness.  She tolerates the nasal pillow mask and feels the pressure is adequate.    Past Medical History  Diagnosis Date  . Anemia   . Migraines   . OSA (obstructive sleep apnea)     Mild OSA AHI 9.3/hr now on CPAP at 6CM H2O  . Hypertension   . Heart murmur   . HA (headache)     Past Surgical History  Procedure Laterality Date  . Tummy tuck    . Ablation    . Cesarean section      x2     Current Outpatient Prescriptions  Medication Sig Dispense Refill  . hydrochlorothiazide (HYDRODIURIL) 25 MG tablet Take 25 mg by mouth daily.    . IRON PO Take 1 capsule by mouth daily. EQUATE OTC    . naproxen (NAPROSYN) 500 MG tablet Take 500 mg by mouth 2 (two) times daily as needed (PAIN). \    . naratriptan (AMERGE) 2.5 MG tablet Take 2.5 mg by mouth daily as needed for migraine.     . Topiramate ER (TROKENDI XR) 200 MG CP24 Take 200 mg by mouth at bedtime. 30 capsule 0   No current facility-administered medications for this visit.    Allergies:   Hydrocil and Imitrex    Social History:  The patient  reports that she has never smoked. She has never used smokeless tobacco. She reports that she drinks alcohol. She reports that she does not use illicit drugs.   Family History:  The patient's family history includes Hypertension in her mother.    ROS:  Please see the history of present  illness.   Otherwise, review of systems are positive for none.   All other systems are reviewed and negative.    PHYSICAL EXAM: VS:  BP 124/76 mmHg  Pulse 81  Ht 5\' 4"  (1.626 m)  Wt 173 lb (78.472 kg)  BMI 29.68 kg/m2  SpO2 99% , BMI Body mass index is 29.68 kg/(m^2). GEN: Well nourished, well developed, in no acute distress HEENT: normal Neck: no JVD, carotid bruits, or masses Cardiac: RRR; no murmurs, rubs, or gallops,no edema  Respiratory:  clear to auscultation bilaterally, normal work of breathing GI: soft, nontender, nondistended, + BS MS: no deformity or atrophy Skin: warm and dry, no rash Neuro:  Strength and sensation are intact Psych: euthymic mood, full affect   EKG:  EKG is not ordered today.    Recent Labs: 11/02/2014: BUN 16; Creatinine 0.84; Potassium 3.8; Sodium 138    Lipid Panel No results found for: CHOL, TRIG, HDL, CHOLHDL, VLDL, LDLCALC, LDLDIRECT    Wt Readings from Last 3 Encounters:  11/16/14 173 lb (78.472 kg)  11/02/14 187 lb (84.823 kg)  01/23/14 175 lb (79.379 kg)       ASSESSMENT AND  PLAN:  1.  Mild OSA on CPAP and tolerating well.  I will get a CPAP d/l from her DME 2.  HTN well controlled.  Continue HCTZ 3.  Obesity - I have encouraged her to continue her exercise.     Current medicines are reviewed at length with the patient today.  The patient does not have concerns regarding medicines.  The following changes have been made:  no change  Labs/ tests ordered today include: None     Disposition:   FU with me in 6 months   Signed, Sueanne Margarita, MD  11/16/2014 8:30 AM    [facility] [place], [place], [place]  [address] Phone: [phone]; Fax: [phone]

## 2014-11-16 ENCOUNTER — Ambulatory Visit (INDEPENDENT_AMBULATORY_CARE_PROVIDER_SITE_OTHER): Payer: 59 | Admitting: Cardiology

## 2014-11-16 ENCOUNTER — Encounter: Payer: Self-pay | Admitting: Cardiology

## 2014-11-16 VITALS — BP 124/76 | HR 81 | Ht 64.0 in | Wt 173.0 lb

## 2014-11-16 DIAGNOSIS — G4733 Obstructive sleep apnea (adult) (pediatric): Secondary | ICD-10-CM

## 2014-11-16 DIAGNOSIS — E669 Obesity, unspecified: Secondary | ICD-10-CM

## 2014-11-16 DIAGNOSIS — I10 Essential (primary) hypertension: Secondary | ICD-10-CM

## 2014-11-16 NOTE — Patient Instructions (Signed)
Your physician recommends that you continue on your current medications as directed. Please refer to the Current Medication list given to you today.  Your physician wants you to follow-up in: 6 month with Dr. Radford Pax. You will receive a reminder letter in the mail two months in advance. If you don't receive a letter, please call our office to schedule the follow-up appointment.

## 2014-11-23 ENCOUNTER — Encounter: Payer: Self-pay | Admitting: Neurology

## 2014-11-23 ENCOUNTER — Ambulatory Visit
Admission: RE | Admit: 2014-11-23 | Discharge: 2014-11-23 | Disposition: A | Discharge status: Home / Self Care | Payer: 59 | Source: Ambulatory Visit | Attending: Neurology | Admitting: Neurology

## 2014-11-23 DIAGNOSIS — G43709 Chronic migraine without aura, not intractable, without status migrainosus: Secondary | ICD-10-CM

## 2014-11-23 DIAGNOSIS — G4452 New daily persistent headache (NDPH): Secondary | ICD-10-CM

## 2014-11-23 MED ORDER — GADOBENATE DIMEGLUMINE 529 MG/ML IV SOLN
15.0000 mL | Freq: Once | INTRAVENOUS | Status: AC | PRN
  Administered 2014-11-23: 15 mL via INTRAVENOUS

## 2014-11-25 ENCOUNTER — Other Ambulatory Visit: Payer: Self-pay | Admitting: Neurology

## 2014-11-25 MED ORDER — TOPIRAMATE 50 MG PO TABS: ORAL_TABLET | ORAL | Status: DC

## 2014-11-26 ENCOUNTER — Encounter: Payer: Self-pay | Admitting: Cardiology

## 2014-12-01 ENCOUNTER — Telehealth: Payer: Self-pay | Admitting: *Deleted

## 2014-12-01 NOTE — Telephone Encounter (Signed)
i would stay on the 50mg  for now and see how it does. If the migraines improve, no need to go higher. But we can try a higher dose at a later date if needed. I'm not sure who told her she need Ophtho consult, I don't see that in my notes.

## 2014-12-01 NOTE — Telephone Encounter (Signed)
Talked with patient about MRI brain results. Patient verbalized understanding. Patient had a couple questions regarding topiramate dosage. She was also told she needed a referral for a opthalmologist, but was confused as to why she needed this. She stated "I did not talk with Dr. Jaynee Eagles about this at the ppointment I had". I told her I would have Dr. Jaynee Eagles call her back regarding these concerns.

## 2014-12-01 NOTE — Telephone Encounter (Signed)
Left a message for the patient to call us back. Gave patient [facility] phone number and office hours.

## 2014-12-01 NOTE — Telephone Encounter (Signed)
Left voicemail for patient to call us back. Returning patients call.

## 2014-12-01 NOTE — Telephone Encounter (Signed)
Patient returned, Kara Dupont, RN call.

## 2014-12-01 NOTE — Telephone Encounter (Signed)
Left a voicemail for the patient to call us back. Gave her the [facility] phone number.

## 2014-12-01 NOTE — Telephone Encounter (Signed)
Pt returned your call, she states she is in the lab and she had her headphones on but if you try again she will be listening for your call back.

## 2014-12-04 ENCOUNTER — Telehealth: Payer: Self-pay | Admitting: *Deleted

## 2014-12-04 NOTE — Telephone Encounter (Signed)
Called and left a message for the patient to call us back. Gave [facility] phone number.

## 2014-12-04 NOTE — Telephone Encounter (Signed)
Talked with husband about Dr. Jaynee Eagles message to have her only take 50 mg for migraines and if this dosage isn't helping that Dr. Jaynee Eagles might try increasing the dosage. Husband verbalized understanding and said he would have her give [place] a call back because she has been having headaches for the past three nights.

## 2014-12-08 ENCOUNTER — Telehealth: Payer: Self-pay | Admitting: *Deleted

## 2014-12-08 NOTE — Telephone Encounter (Signed)
Patient calling wanting to know if her paperwork was received. It is a FMLA form she is looking for.

## 2014-12-09 NOTE — Telephone Encounter (Signed)
Can you try calling her to see when and how she sent it? I don't remember getting it. Did she drop it off at the front desk?

## 2014-12-10 ENCOUNTER — Telehealth: Payer: Self-pay | Admitting: *Deleted

## 2014-12-10 NOTE — Telephone Encounter (Signed)
Left a voicemail for the patient to call us back. Gave [facility] phone number and office hours.

## 2014-12-11 ENCOUNTER — Telehealth: Payer: Self-pay | Admitting: *Deleted

## 2014-12-11 NOTE — Telephone Encounter (Signed)
Talked with patient about when she dropped the forms off. She said she was having her work fax it over but [facility] did not receive the fax. She then said the receptionist that checked her in gave her our fax number again and we never received anything. I gave her our fax number again [phone] and told her to try sending it over again while I look and see if I could find it. I told her we would call her back if we needed more information and to let her know if we got it on Monday or not. Patient verbalized understanding.

## 2014-12-14 ENCOUNTER — Telehealth: Payer: Self-pay | Admitting: *Deleted

## 2014-12-14 NOTE — Telephone Encounter (Signed)
Talked with patient to let her know we received her FMLA forms and that Dr. Jaynee Eagles has 14 days to fill them out. I told her there would be a 25.00 dollar fee as well. Told pt she can pay in person, over the phone or send a check in the mail.  Pt verbalized understanding.

## 2015-01-04 ENCOUNTER — Telehealth: Payer: Self-pay | Admitting: *Deleted

## 2015-01-04 NOTE — Telephone Encounter (Signed)
Talked with pt and told her that her FMLA paperwork was ready to be picked up. Pt verbalized understanding and said she still needs to pay.

## 2015-01-05 DIAGNOSIS — Z0289 Encounter for other administrative examinations: Secondary | ICD-10-CM

## 2015-01-06 ENCOUNTER — Telehealth: Payer: Self-pay | Admitting: *Deleted

## 2015-01-06 NOTE — Telephone Encounter (Signed)
Patient form faxed to [employer] on 01/06/15.

## 2015-01-07 ENCOUNTER — Encounter: Payer: Self-pay | Admitting: Cardiology

## 2015-01-19 ENCOUNTER — Telehealth: Payer: Self-pay | Admitting: Neurology

## 2015-01-19 NOTE — Telephone Encounter (Signed)
Patient wanted to know why her FMLA paperwork was approved from 1/18 through 4/25. I told her I did not put those restrictions on the paperwork I merely answered questions asked. I believe some of the questions asked were the first time I saw the patient in clinic and the next scheduled appointment which I believe roughly corresponded to those two dates. I only fill out the paperwork with the medically relevant information that I am asked.   She also said she has had only one migraine since seeing me in January and starting the medication.thanks.

## 2015-01-19 NOTE — Telephone Encounter (Signed)
Talked with patient and she said she did not need her appt on 02/01/15 but had some questions about FMLA paperwork and wanted to speak with Dr. Jaynee Eagles. Dr. Jaynee Eagles talked with pt and clarified questions.

## 2015-01-19 NOTE — Telephone Encounter (Signed)
Patient questioning why she's scheduled to come in on 02/01/15.  At last OV on 11/02/14 Dr. Jaynee Eagles did not state she wanted a follow up visit.  Please call and advise.

## 2015-01-20 ENCOUNTER — Telehealth: Payer: Self-pay | Admitting: *Deleted

## 2015-01-20 NOTE — Telephone Encounter (Signed)
Patient needs to give an update on patient's FMLA paperwork. Please call.

## 2015-01-20 NOTE — Telephone Encounter (Signed)
Talked with pt and told her we would look for FMLA paperwork and make adjustments and resend them to her. Pt verbalized understanding.

## 2015-01-20 NOTE — Telephone Encounter (Signed)
Faxed updated FMLA to [employer]. Let patient know

## 2015-01-28 ENCOUNTER — Telehealth: Payer: Self-pay | Admitting: Neurology

## 2015-01-28 NOTE — Telephone Encounter (Signed)
I revised it from October 16 2013 through October 16 2014 thanks

## 2015-01-28 NOTE — Telephone Encounter (Signed)
Talked with pt and she wanted to just double check the date that Dr. Jaynee Eagles revised on her FMLA forms. I tol dher January per Dr. Jaynee Eagles. Pt verbalized understanding and wanted to make sure she had an appt with Dr. Jaynee Eagles before her forms expired.

## 2015-01-28 NOTE — Telephone Encounter (Signed)
Patient cancelled her apt on 02/01/15 stating that she did not need the 3 month f/u that the medication was working well. Before she reschedules for 6 months, she would like a call back from Dr. Cathren Laine nurse regarding her FMLA papers and a date in July that was submitted. Please call [phone].

## 2015-02-01 ENCOUNTER — Ambulatory Visit: Payer: 59 | Admitting: Neurology

## 2015-02-09 ENCOUNTER — Encounter: Payer: Self-pay | Admitting: Cardiology

## 2016-01-19 ENCOUNTER — Other Ambulatory Visit: Payer: Self-pay | Admitting: Neurology

## 2017-01-12 ENCOUNTER — Other Ambulatory Visit: Payer: Self-pay | Admitting: Family Medicine

## 2017-01-12 DIAGNOSIS — Z1231 Encounter for screening mammogram for malignant neoplasm of breast: Secondary | ICD-10-CM

## 2017-01-29 ENCOUNTER — Other Ambulatory Visit: Payer: Self-pay | Admitting: Family Medicine

## 2017-01-29 ENCOUNTER — Ambulatory Visit
Admission: RE | Admit: 2017-01-29 | Discharge: 2017-01-29 | Disposition: A | Discharge status: Home / Self Care | Payer: Medicaid Other | Source: Ambulatory Visit | Attending: Family Medicine | Admitting: Family Medicine

## 2017-01-29 DIAGNOSIS — Z1231 Encounter for screening mammogram for malignant neoplasm of breast: Secondary | ICD-10-CM

## 2017-05-21 ENCOUNTER — Other Ambulatory Visit: Payer: Self-pay | Admitting: Gastroenterology

## 2017-05-21 DIAGNOSIS — R1011 Right upper quadrant pain: Secondary | ICD-10-CM

## 2017-05-22 ENCOUNTER — Ambulatory Visit
Admission: RE | Admit: 2017-05-22 | Discharge: 2017-05-22 | Disposition: A | Discharge status: Home / Self Care | Payer: Medicaid Other | Source: Ambulatory Visit | Attending: Gastroenterology | Admitting: Gastroenterology

## 2017-05-22 DIAGNOSIS — R1011 Right upper quadrant pain: Secondary | ICD-10-CM

## 2018-01-08 ENCOUNTER — Other Ambulatory Visit: Payer: Self-pay | Admitting: Family Medicine

## 2018-01-08 DIAGNOSIS — Z1231 Encounter for screening mammogram for malignant neoplasm of breast: Secondary | ICD-10-CM

## 2018-02-04 ENCOUNTER — Ambulatory Visit
Admission: RE | Admit: 2018-02-04 | Discharge: 2018-02-04 | Disposition: A | Discharge status: Home / Self Care | Payer: Medicaid Other | Source: Ambulatory Visit | Attending: Family Medicine | Admitting: Family Medicine

## 2018-02-04 DIAGNOSIS — Z1231 Encounter for screening mammogram for malignant neoplasm of breast: Secondary | ICD-10-CM

## 2018-02-27 ENCOUNTER — Other Ambulatory Visit: Payer: Self-pay

## 2018-03-20 ENCOUNTER — Ambulatory Visit: Payer: Medicaid Other | Admitting: Cardiology

## 2018-03-20 ENCOUNTER — Encounter: Payer: Self-pay | Admitting: Cardiology

## 2018-03-20 VITALS — BP 132/68 | HR 72 | Ht 64.0 in | Wt 181.8 lb

## 2018-03-20 DIAGNOSIS — G4733 Obstructive sleep apnea (adult) (pediatric): Secondary | ICD-10-CM | POA: Diagnosis not present

## 2018-03-20 DIAGNOSIS — I10 Essential (primary) hypertension: Secondary | ICD-10-CM

## 2018-03-20 DIAGNOSIS — E669 Obesity, unspecified: Secondary | ICD-10-CM | POA: Diagnosis not present

## 2018-03-20 NOTE — Progress Notes (Signed)
Cardiology Office Note:    Date:  03/20/2018   ID:  Kara [name], DOB [birth year], MRN [id number]  PCP:  Aretta Nip, MD  Cardiologist:  No primary care provider on file.    Referring MD: Aretta Nip, MD   Chief Complaint  Patient presents with  . Sleep Apnea  . Hypertension    History of Present Illness:    Kara [name] is a 52 y.o. 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 she had been compliant with her device up until a few months ago when she was having difficulty getting it cleaned appropriately.  She stopped using it at that time.  She is now noticed that she is waking up fatigued in the morning.  Exercise has helped with daytime sleepiness some but she is still feeling sluggish in the morning if she does not exercise she has to take a nap during the day.  She says her husband says that she snores.  She denies any morning headaches.  She was tolerating a nasal pillow mask prior to stopping her CPAP therapy.  Past Medical History:  Diagnosis Date  . Anemia   . Carpal tunnel syndrome   . HA (headache)   . Heart murmur   . Hypertension   . Migraines   . OSA (obstructive sleep apnea)    Mild OSA AHI 9.3/hr now on CPAP at 6CM H2O  . Sleep apnea     Past Surgical History:  Procedure Laterality Date  . ABLATION    . CESAREAN SECTION     x2  . tummy tuck      Current Medications: Current Meds  Medication Sig  . Biotin w/ Vitamins C & E (HAIR/SKIN/NAILS PO) Take by mouth. 1 gummie daily  . cyclobenzaprine (FLEXERIL) 10 MG tablet Take 10 mg by mouth 3 (three) times daily as needed for muscle spasms.  . hydrochlorothiazide (HYDRODIURIL) 25 MG tablet Take 25 mg by mouth daily.  . IRON PO Take 1 capsule by mouth daily. EQUATE OTC  . meloxicam (MOBIC) 15 MG tablet Take 15 mg by mouth as needed for pain (knee).  . Multiple Vitamins-Minerals (HAIR SKIN AND NAILS FORMULA PO) Take as directed  . naproxen (NAPROSYN) 500 MG  tablet Take 500 mg by mouth 2 (two) times daily as needed (PAIN). \  . naratriptan (AMERGE) 2.5 MG tablet Take 2.5 mg by mouth as needed for migraine. Take one (1) tablet at onset of headache; if returns or does not resolve, may repeat after 4 hours; do not exceed five (5) mg in 24 hours.  . topiramate (TOPAMAX) 50 MG tablet Take one half tablet in the morning and one whole tablet at night. May increase to one whole tablet twice daily if needed.     Allergies:   Hydrocil [psyllium]; Imitrex [sumatriptan]; and Other   Social History   Socioeconomic History  . Marital status: Married    Spouse name: [name]  . Number of children: 2  . Years of education: college  . Highest education level: Not on file  Occupational History    Comment: [employer]  Social Needs  . Financial resource strain: Not on file  . Food insecurity:    Worry: Not on file    Inability: Not on file  . Transportation needs:    Medical: Not on file    Non-medical: Not on file  Tobacco Use  . Smoking status: Never  Smoker  . Smokeless tobacco: Never Used  Substance and Sexual Activity  . Alcohol use: Yes    Alcohol/week: 0.0 oz  . Drug use: No  . Sexual activity: Yes    Birth control/protection: None  Lifestyle  . Physical activity:    Days per week: Not on file    Minutes per session: Not on file  . Stress: Not on file  Relationships  . Social connections:    Talks on phone: Not on file    Gets together: Not on file    Attends religious service: Not on file    Active member of club or organization: Not on file    Attends meetings of clubs or organizations: Not on file    Relationship status: Not on file  Other Topics Concern  . Not on file  Social History Narrative   Patient lives at home with her husband and two children.   Patient works full time for [employer].   Education college.   Right handed.   Caffeine two cup daily coffee.     Family History: The patient's family history includes  Hypertension in her mother.  ROS:   Please see the history of present illness.    ROS  All other systems reviewed and negative.   EKGs/Labs/Other Studies Reviewed:    The following studies were reviewed today: none  EKG:  EKG is not ordered today.    Recent Labs: No results found for requested labs within last 8760 hours.   Recent Lipid Panel No results found for: CHOL, TRIG, HDL, CHOLHDL, VLDL, LDLCALC, LDLDIRECT  Physical Exam:    VS:  BP 132/68   Pulse 72   Ht 5\' 4"  (1.626 m)   Wt 181 lb 12.8 oz (82.5 kg)   SpO2 97%   BMI 31.21 kg/m     Wt Readings from Last 3 Encounters:  03/20/18 181 lb 12.8 oz (82.5 kg)  11/16/14 173 lb (78.5 kg)  11/02/14 187 lb (84.8 kg)     GEN:  Well nourished, well developed in no acute distress HEENT: Normal NECK: No JVD; No carotid bruits LYMPHATICS: No lymphadenopathy CARDIAC: RRR, no murmurs, rubs, gallops RESPIRATORY:  Clear to auscultation without rales, wheezing or rhonchi  ABDOMEN: Soft, non-tender, non-distended MUSCULOSKELETAL:  No edema; No deformity  SKIN: Warm and dry NEUROLOGIC:  Alert and oriented x 3 PSYCHIATRIC:  Normal affect   ASSESSMENT:    1. OSA (obstructive sleep apnea)   2. Essential hypertension   3. Obesity (BMI 30-39.9)    PLAN:    In order of problems listed above:  1.  OSA -she would like to get a whole new device as hers is more than 51 years old.  I will order an air since CPAP from 5 to 18 cm H2O with heated humidity.  I will also order a ResMed air fit P 30 mask.  I will get a download in 4 weeks.  She will see me back in 10 weeks per insurance requirements to document compliance.  2.  HTN - BP is well controlled on exam today.  She will continue on HCTZ 25mg  daily.    3.  Obesity - I have encouraged her to get into a routine exercise program and cut back on carbs and portions.    Medication Adjustments/Labs and Tests Ordered: Current medicines are reviewed at length with the patient today.   Concerns regarding medicines are outlined above.  No orders of the defined types were placed  in this encounter.  No orders of the defined types were placed in this encounter.   Signed, Fransico Him, MD  03/20/2018 8:36 AM    [place]

## 2018-03-20 NOTE — Patient Instructions (Addendum)
Medication Instructions:  Your physician recommends that you continue on your current medications as directed. Please refer to the Current Medication list given to you today.  Labwork: None Ordered   Testing/Procedures: None Ordered   Follow-Up: Your physician recommends that you schedule a follow-up appointment in: in 10 weeks with Dr. Radford Pax    Any Other Special Instructions Will Be Listed Below (If Applicable).  CPAP orders have been placed. You will receive a call from the home health agency regarding setting up equipment. If you do not receive a call within the next week give Gae Bon, CPAP assistant a call at [phone].   Thank you for choosing [facility], RN  [phone]    If you need a refill on your cardiac medications before your next appointment, please call your pharmacy.

## 2018-04-22 ENCOUNTER — Encounter: Payer: Self-pay | Admitting: Cardiology

## 2018-04-30 ENCOUNTER — Telehealth: Payer: Self-pay | Admitting: Cardiology

## 2018-04-30 NOTE — Telephone Encounter (Signed)
New message    1) What problem are you experiencing? Has not received cpap machine  2) Who is your medical equipment company?    Please route to the sleep study assistant.

## 2018-05-02 NOTE — Telephone Encounter (Signed)
LMTCB

## 2018-05-14 NOTE — Telephone Encounter (Signed)
Follow Up

## 2018-05-14 NOTE — Telephone Encounter (Signed)
Follow up    Patient calling upset because she has not received a call back regarding cpap. Patient states she does not know the name of vendor to use. Please call

## 2018-05-15 NOTE — Telephone Encounter (Signed)
Reached out to patient and informed her that [facility] is her DME and they are waiting for a prior authorization before they can complete her cpap order. Patient states her Medicaid will run out and she wants her unit before that happens. Reached out to [facility] and lmtcb.

## 2018-06-06 ENCOUNTER — Ambulatory Visit: Payer: Medicaid Other | Admitting: Cardiology

## 2019-04-08 ENCOUNTER — Other Ambulatory Visit: Payer: Self-pay | Admitting: Family Medicine

## 2019-04-08 DIAGNOSIS — Z1231 Encounter for screening mammogram for malignant neoplasm of breast: Secondary | ICD-10-CM

## 2019-04-11 ENCOUNTER — Ambulatory Visit
Admission: RE | Admit: 2019-04-11 | Discharge: 2019-04-11 | Disposition: A | Discharge status: Home / Self Care | Payer: Medicaid Other | Source: Ambulatory Visit | Attending: Family Medicine | Admitting: Family Medicine

## 2019-04-11 ENCOUNTER — Other Ambulatory Visit: Payer: Self-pay

## 2019-04-11 DIAGNOSIS — Z1231 Encounter for screening mammogram for malignant neoplasm of breast: Secondary | ICD-10-CM

## 2019-04-14 ENCOUNTER — Other Ambulatory Visit: Payer: Self-pay | Admitting: Family Medicine

## 2019-04-14 DIAGNOSIS — R921 Mammographic calcification found on diagnostic imaging of breast: Secondary | ICD-10-CM

## 2019-04-16 DIAGNOSIS — C801 Malignant (primary) neoplasm, unspecified: Secondary | ICD-10-CM

## 2019-04-16 HISTORY — DX: Malignant (primary) neoplasm, unspecified: C80.1

## 2019-04-17 ENCOUNTER — Other Ambulatory Visit: Payer: Self-pay | Admitting: Family Medicine

## 2019-04-17 ENCOUNTER — Other Ambulatory Visit: Payer: Self-pay

## 2019-04-17 ENCOUNTER — Ambulatory Visit
Admission: RE | Admit: 2019-04-17 | Discharge: 2019-04-17 | Disposition: A | Discharge status: Home / Self Care | Payer: 59 | Source: Ambulatory Visit | Attending: Family Medicine | Admitting: Family Medicine

## 2019-04-17 DIAGNOSIS — R921 Mammographic calcification found on diagnostic imaging of breast: Secondary | ICD-10-CM

## 2019-04-23 ENCOUNTER — Other Ambulatory Visit: Payer: Self-pay

## 2019-04-23 ENCOUNTER — Ambulatory Visit
Admission: RE | Admit: 2019-04-23 | Discharge: 2019-04-23 | Disposition: A | Discharge status: Home / Self Care | Payer: 59 | Source: Ambulatory Visit | Attending: Family Medicine | Admitting: Family Medicine

## 2019-04-23 DIAGNOSIS — R921 Mammographic calcification found on diagnostic imaging of breast: Secondary | ICD-10-CM

## 2019-04-25 ENCOUNTER — Other Ambulatory Visit: Payer: Self-pay | Admitting: Obstetrics & Gynecology

## 2019-04-30 ENCOUNTER — Encounter: Payer: Self-pay | Admitting: Adult Health

## 2019-04-30 DIAGNOSIS — C50312 Malignant neoplasm of lower-inner quadrant of left female breast: Secondary | ICD-10-CM | POA: Insufficient documentation

## 2019-05-02 ENCOUNTER — Ambulatory Visit: Payer: Self-pay | Admitting: Surgery

## 2019-05-02 DIAGNOSIS — D0512 Intraductal carcinoma in situ of left breast: Secondary | ICD-10-CM

## 2019-05-02 NOTE — H&P (Signed)
JARROD BODKINS Documented: 05/02/2019 9:47 AM Location: [facility] Patient #: [id number] DOB: August 02, 1966 Married / Language: English / Race: Black or African American Female  History of Present Illness Marcello Moores A. Quiera Diffee MD; 05/02/2019 11:51 AM) Patient words: Patient sent at the request of Dr. Rosana Hoes due to abnormal screening mammogram. The patient underwent screening mammography which showed a 4 cm cluster of left breast microcalcifications that were pleomorphic in the upper outer quadrant. Core biopsy showed low-grade DCIS. The patient denies any history of breast pain, nipple discharge or change in appearance of her breast. She has a sister who was diagnosed with breast cancer at age 40 restore alive. No other family history.                   Recall from screening mammography with tomosynthesis, calcifications involving the LOWER INNER QUADRANT of the LEFT breast. Family history of breast cancer in her sister who was diagnosed at age 100. EXAM: DIGITAL DIAGNOSTIC LEFT MAMMOGRAM WITH CAD COMPARISON: Previous exam(s). ACR Breast Density Category c: The breast tissue is heterogeneously dense, which may obscure small masses. FINDINGS: Standard spot magnification CC and mediolateral views of the LEFT breast calcifications and a standard 2D full field mediolateral view of the LEFT breast were obtained. Spot magnification views confirm amorphous and fine heterogeneous calcifications in the LOWER INNER QUADRANT at MIDDLE to POSTERIOR depth which span approximately 4 x 4 x 3 cm (AP x coronal x craniocaudal), some of which are more tightly grouped. There is no definite layering of the calcifications on the mediolateral image. The calcifications are in a segmental distribution. No suspicious findings elsewhere in the LEFT breast on the full field mediolateral image. Mammographic images were processed with CAD. IMPRESSION: Indeterminate calcifications involving the  LOWER INNER QUADRANT of the LEFT breast. RECOMMENDATION: Stereotactic tomosynthesis core needle biopsy of the LEFT breast calcifications. The stereotactic tomosynthesis biopsy procedure was discussed with patient her questions were answered. She has agreed to proceed in the biopsy has been scheduled for Wednesday, July 8 at 8:30 a.m. I have discussed the findings and recommendations with the patient. BI-RADS CATEGORY 4: Suspicious. Electronically Signed By: Evangeline Dakin M.D. On: 04/17/2019 09:22  Result History  MM Digital Diagnostic [place] (Order #[id number]) on 04/17/2019 - Order Result History Report <[email or web address]>  MM 3D SCREEN BREAST BILATERAL (Order [id number]) Study Result  CLINICAL DATA: Screening. EXAM: DIGITAL SCREENING BILATERAL MAMMOGRAM WITH TOMO AND CAD COMPARISON: Previous exam(s). ACR Breast Density Category c: The breast tissue is heterogeneously dense, which may obscure small masses. FINDINGS: In the left breast, calcifications warrant further evaluation. In the right breast, no findings suspicious for malignancy. Images were processed with CAD. IMPRESSION: Further evaluation is suggested for calcifications in the left breast. RECOMMENDATION: Diagnostic mammogram of the left breast. (Code:[id number]) The patient will be contacted regarding the findings, and additional imaging will be scheduled. BI-RADS CATEGORY 0: Incomplete. Need additional imaging evaluation and/or prior mammograms for comparison. Electronically Signed By: Lovey Newcomer M.D. On: 04/11/2019 12:53                 ADDITIONAL INFORMATION: PROGNOSTIC INDICATORS Results: IMMUNOHISTOCHEMICAL AND MORPHOMETRIC ANALYSIS PERFORMED MANUALLY Estrogen Receptor: 90%, POSITIVE, STRONG STAINING INTENSITY Progesterone Receptor: 60%, POSITIVE, MODERATE STAINING INTENSITY REFERENCE RANGE ESTROGEN RECEPTOR NEGATIVE 0% POSITIVE =>1% REFERENCE RANGE PROGESTERONE  RECEPTOR NEGATIVE 0% POSITIVE =>1% All controls stained appropriately Vicente Males MD Pathologist, Electronic Signature ( Signed 04/25/2019) FINAL DIAGNOSIS Diagnosis Breast, left, needle core biopsy, LIQ - DUCTAL CARCINOMA IN SITU  WITH CALCIFICATIONS, SEE COMMENT. - FIBROCYSTIC AND FIBROADENOMATOID CHANGE. 1 of 2 FINAL for TAMMATHA, COBB [phone]) Microscopic Comment The carcinoma appears low grade. Prognostic markers will be ordered. Dr. Lyndon Code has reviewed the case. The case was called to [facility] on 04/24/2019. Vicente Males MD Pathologist, Electronic Signature (Case signed 04/24/2019) Specimen Gross and Clinical Information Specimen Comment In formalin 8:50, extracted less than 1 min; indeterminate.  The patient is a 53 year old female.   Past Surgical History Sharyn Lull R. Rolena Infante, CMA; 05/02/2019 10:31 AM) Cesarean Section - Multiple Foot Surgery Bilateral.  Diagnostic Studies History Sharyn Lull R. Brooks, CMA; 05/02/2019 10:31 AM) Colonoscopy 1-5 years ago Mammogram 1-3 years ago  Allergies (Tanisha A. Owens Shark, [place]; 05/02/2019 9:48 AM) Amitriptyline HCl *ANTIDEPRESSANTS* Allergies Reconciled  Medication History (Tanisha A. Owens Shark, RMA; 05/02/2019 9:48 AM) hydroCHLOROthiazide (25MG  Tablet, Oral) Active. Multi-Vitamin (Oral) Active. Medications Reconciled  Social History Sharyn Lull R. Brooks, CMA; 05/02/2019 10:31 AM) Alcohol use Moderate alcohol use. Caffeine use Coffee. No drug use Tobacco use Never smoker.  Family History Sharyn Lull R. Brooks, CMA; 05/02/2019 10:31 AM) Breast Cancer Sister. Hypertension Mother. Migraine Headache Mother.  Pregnancy / Birth History Sharyn Lull R. Rolena Infante, CMA; 05/02/2019 10:31 AM) Age at menarche 41 years. Age of menopause 24-55 Gravida 4 Irregular periods Length (months) of breastfeeding 3-6 Maternal age 86-25 Para 2  Other Problems Sharyn Lull R. Brooks, CMA; 05/02/2019 10:31 AM) High  blood pressure Lump In Breast Migraine Headache     Review of Systems [facility] R. Brooks CMA; 05/02/2019 10:31 AM) General Not Present- Appetite Loss, Chills, Fatigue, Fever, Night Sweats, Weight Gain and Weight Loss. Skin Not Present- Change in Wart/Mole, Dryness, Hives, Jaundice, New Lesions, Non-Healing Wounds, Rash and Ulcer. HEENT Not Present- Earache, Hearing Loss, Hoarseness, Nose Bleed, Oral Ulcers, Ringing in the Ears, Seasonal Allergies, Sinus Pain, Sore Throat, Visual Disturbances, Wears glasses/contact lenses and Yellow Eyes. Breast Present- Breast Mass and Breast Pain. Not Present- Nipple Discharge and Skin Changes. Cardiovascular Not Present- Chest Pain, Difficulty Breathing Lying Down, Leg Cramps, Palpitations, Rapid Heart Rate, Shortness of Breath and Swelling of Extremities. Gastrointestinal Not Present- Abdominal Pain, Bloating, Bloody Stool, Change in Bowel Habits, Chronic diarrhea, Constipation, Difficulty Swallowing, Excessive gas, Gets full quickly at meals, Hemorrhoids, Indigestion, Nausea, Rectal Pain and Vomiting. Female Genitourinary Not Present- Frequency, Nocturia, Painful Urination, Pelvic Pain and Urgency. Musculoskeletal Not Present- Back Pain, Joint Pain, Joint Stiffness, Muscle Pain, Muscle Weakness and Swelling of Extremities. Neurological Not Present- Decreased Memory, Fainting, Headaches, Numbness, Seizures, Tingling, Tremor, Trouble walking and Weakness. Psychiatric Not Present- Anxiety, Bipolar, Change in Sleep Pattern, Depression, Fearful and Frequent crying. Endocrine Not Present- Cold Intolerance, Excessive Hunger, Hair Changes, Heat Intolerance, Hot flashes and New Diabetes. Hematology Not Present- Blood Thinners, Easy Bruising, Excessive bleeding, Gland problems, HIV and Persistent Infections.  Vitals (Tanisha A. Brown RMA; 05/02/2019 9:48 AM) 05/02/2019 9:47 AM Weight: 182.4 lb Height: 64.5in Body Surface Area: 1.89 m Body Mass Index: 30.83  kg/m  Temp.: 98.86F  Pulse: 90 (Regular)  BP: 132/84 (Sitting, Left Arm, Standard)        Physical Exam (Masato Pettie A. Anel Purohit MD; 05/02/2019 11:51 AM)  General Mental Status-Alert. General Appearance-Consistent with stated age. Hydration-Well hydrated. Voice-Normal.  Head and Neck Head-normocephalic, atraumatic with no lesions or palpable masses.  Chest and Lung Exam Chest and lung exam reveals -quiet, even and easy respiratory effort with no use of accessory muscles and on auscultation, normal breath sounds, no adventitious sounds and normal vocal resonance. Inspection Chest Wall -  Normal. Back - normal.  Breast Breast - Left-Symmetric, Non Tender, No Biopsy scars, no Dimpling - Left, No Inflammation, No Lumpectomy scars, No Mastectomy scars, No Peau d' Orange. Breast - Right-Symmetric, Non Tender, No Biopsy scars, no Dimpling - Right, No Inflammation, No Lumpectomy scars, No Mastectomy scars, No Peau d' Orange. Breast Lump-No Palpable Breast Mass. Note: Small left breast hematoma  Cardiovascular Cardiovascular examination reveals -on palpation PMI is normal in location and amplitude, no palpable S3 or S4. Normal cardiac borders., normal heart sounds, regular rate and rhythm with no murmurs, carotid auscultation reveals no bruits and normal pedal pulses bilaterally.  Lymphatic Head & Neck  General Head & Neck Lymphatics: Bilateral - Description - Normal. Axillary  General Axillary Region: Bilateral - Description - Normal. Tenderness - Non Tender.    Assessment & Plan (Kanisha Duba A. Shavy Beachem MD; 05/02/2019 11:52 AM)  DUCTAL CARCINOMA IN SITU (DCIS) OF LEFT BREAST (D05.12) Impression: Low-grade Discussed, COMET trial with her and husband and she is not interested  She has opted for left breast lumpectomy. She will be referred to medical and radiation oncology as well as genetics insertion history of breast cancer in her 34s. Discuss treatment  options for DCIS to include mastectomy and reconstruction, breast conservation followed by radiation therapy and possible chemotherapy prevention.   Risk of lumpectomy include bleeding, infection, seroma, more surgery, use of seed/wire, wound care, cosmetic deformity and the need for other treatments, death , blood clots, death. Pt agrees to proceed.   Patient is interested IN breast reduction therefore for plastic surgery  Current Plans Pt Education - [facility] Breast Biopsy HCI: discussed with patient and provided information. Pt Education - ABC (After Breast Cancer) Class Info: discussed with patient and provided information. You are being scheduled for surgery- Our schedulers will call you.  You should hear from our office's scheduling department within 5 working days about the location, date, and time of surgery. We try to make accommodations for patient's preferences in scheduling surgery, but sometimes the OR schedule or the surgeon's schedule prevents [place] from making those accommodations.  If you have not heard from our office [phone]) in 5 working days, call the office and ask for your surgeon's nurse.  If you have other questions about your diagnosis, plan, or surgery, call the office and ask for your surgeon's nurse.  We discussed the staging and pathophysiology of breast cancer. We discussed all of the different options for treatment for breast cancer including surgery, chemotherapy, radiation therapy, Herceptin, and antiestrogen therapy. We discussed a sentinel lymph node biopsy as she does not appear to having lymph node involvement right now. We discussed the performance of that with injection of radioactive tracer and blue dye. We discussed that she would have an incision underneath her axillary hairline. We discussed that there is a bout a 10-20% chance of having a positive node with a sentinel lymph node biopsy and we will await the permanent pathology to make any other first  further decisions in terms of her treatment. One of these options might be to return to the operating room to perform an axillary lymph node dissection. We discussed about a 1-2% risk lifetime of chronic shoulder pain as well as lymphedema associated with a sentinel lymph node biopsy. We discussed the options for treatment of the breast cancer which included lumpectomy versus a mastectomy. We discussed the performance of the lumpectomy with a wire placement. We discussed a 10-20% chance of a positive margin requiring reexcision in the operating  room. We also discussed that she may need radiation therapy or antiestrogen therapy or both if she undergoes lumpectomy. We discussed the mastectomy and the postoperative care for that as well. We discussed that there is no difference in her survival whether she undergoes lumpectomy with radiation therapy or antiestrogen therapy versus a mastectomy. There is a slight difference in the local recurrence rate being 3-5% with lumpectomy and about 1% with a mastectomy. We discussed the risks of operation including bleeding, infection, possible reoperation. She understands her further therapy will be based on what her stages at the time of her operation.  Pt Education - flb breast cancer surgery: discussed with patient and provided information.

## 2019-05-07 NOTE — Progress Notes (Signed)
Location of Breast Cancer: Malignant neoplasm of lower inner quadrant of left breast  Did patient present with symptoms (if so, please note symptoms) or was this found on screening mammography?: Screening mammogram showed 4 cm cluster of left breast micro-calcifications that were pleomorphic in the upper outer quadrant.    Histology per Pathology Report: Left Breast 04/23/2019  Receptor Status: ER(90% +), PR (60% +), Her2-neu (), Ki-()    Past/Anticipated interventions by surgeon, if any: Dr. Brantley Stage 05/02/2019 -Discussed COMET trial with her and husband and she is not interested. -Discussed treatment options for DCIS to include mastectomy and reconstruction, breast conservation followed by radiation therapy and possible chemotherapy prevention. -She has opted for left breast lumpectomy.   -She will be referred to medical and radiation oncology as well as genetics. -Patient is interested in breast reduction, referral for plastic surgery. -Left breast oncoplastic reconstruction tentatively scheduled for 06/17/2019- Dr. Iran Planas -Lumpectomy scheduled 06/10/2019   Past/Anticipated interventions by medical oncology, if any: Chemotherapy    Lymphedema issues, if any: no  Pain issues, if any:  No  SAFETY ISSUES:  Prior radiation? No  Pacemaker/ICD? No  Possible current pregnancy? No, ablation  Is the patient on methotrexate? No  Current Complaints / other details:      Cori Razor, RN 05/07/2019,11:05 AM

## 2019-05-13 ENCOUNTER — Encounter: Payer: Self-pay | Admitting: Oncology

## 2019-05-13 ENCOUNTER — Telehealth: Payer: Self-pay | Admitting: Oncology

## 2019-05-13 ENCOUNTER — Ambulatory Visit
Admission: RE | Admit: 2019-05-13 | Discharge: 2019-05-13 | Disposition: A | Discharge status: Home / Self Care | Payer: 59 | Source: Ambulatory Visit | Attending: Radiation Oncology | Admitting: Radiation Oncology

## 2019-05-13 ENCOUNTER — Encounter: Payer: Self-pay | Admitting: Radiation Oncology

## 2019-05-13 ENCOUNTER — Other Ambulatory Visit: Payer: Self-pay | Admitting: Surgery

## 2019-05-13 ENCOUNTER — Other Ambulatory Visit: Payer: Self-pay

## 2019-05-13 ENCOUNTER — Encounter: Payer: Self-pay | Admitting: *Deleted

## 2019-05-13 VITALS — Ht 64.5 in | Wt 183.0 lb

## 2019-05-13 DIAGNOSIS — D0512 Intraductal carcinoma in situ of left breast: Secondary | ICD-10-CM

## 2019-05-13 DIAGNOSIS — C50312 Malignant neoplasm of lower-inner quadrant of left female breast: Secondary | ICD-10-CM

## 2019-05-13 NOTE — Telephone Encounter (Signed)
Received a new patient referral from Dr. Brantley Stage for DCIS. Pt has been cld and scheduled to see Dr. Jana Hakim on 8/4 at 430pm. Pt has been made aware to arrive 20 minutes early.Letter mailed.

## 2019-05-14 ENCOUNTER — Encounter: Payer: Self-pay | Admitting: *Deleted

## 2019-05-14 ENCOUNTER — Encounter: Payer: Self-pay | Admitting: General Practice

## 2019-05-14 NOTE — Progress Notes (Signed)
[place] Work  Clinical Social Work received referral from radiation oncology for emotional support.  CSW contacted patient at home to offer support and assess for needs.  CSW was unable to leave a message due to patients voicemail not being set up.  CSW will continue attempting to contact patient, and a member of the [facility] support team will attempt to see her on 8/4 when she is at [facility] for an appointment.     Johnnye Lana, MSW, LCSW, OSW-C Clinical Social Worker [facility] [phone]

## 2019-05-14 NOTE — Progress Notes (Signed)
[facility] CSW Progress NOtes  Met w patient by phone to assess for psychosocial needs and provide supportive resources.  Patient recently diagnosed with cancer, not sure of treatment plan at this time but knows she has several surgeries and radiation upcoming.  Significant distress due to fact that sister died of cancer many years ago, and another sister also had cancer but is still living.  Work environment has not been supportive of patient's needs for additional support to work from home, has not provided necessary equipment.  Patient hopes to concentrate on wellness/health and hopes to obtain short term disability coverage, already has FMLA in place for appointments and similar.  Has been signficantly depressed from compounding stress of cancer, COVID fears and work displacement.  Sleeps much of the day, difficulty concentrating, irritable, feels like little is within her control.  Reports depression 8 out of 10, denies active suicidal or homicidal ideation.  Lives w 3 children - 31 year old son, two younger god children.  Has had difficulty focusing on their needs as she wants to.  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  Also pursuing PhD in business, currently taking two classes.  Has been unable to handle the load she could in the past.  Separated from husband who is practically but not emotionally supportive.  Has few friends she can depend on, tends to keep things to herself in order not to worry elderly mother or bother friends.  Is overwhelmed by demands of coping with multiple stressors currently.    Discussed difficulty of being in the stage of cancer treatment that she is - has not yet met w oncologist who will help develop overall treatment plan and should be able to provide objective information on disease and prognosis.  Patient aware but not reassured by thought that her cancer is "not that serious."  She herself is significantly worried about having  cancer and has difficulty finding relief from anxiety.  Has talked w PCP who has prescribed an antidepressant.  Has also reached out to her EAP who is making a referral for counseling.  CSW will email information on various options available through [place]. Will refer for [facility] guide. Encouraged patient to identify activities that bring her pleasure and aggressively pursue these for next few days until she meets w oncologist.  Also encouraged participation in group support and wellness activities provided by [employer] and [employer].  Will check back with patient to determine further needs.  Edwyna Shell, LCSW Clinical Social Worker Phone:  [phone]

## 2019-05-14 NOTE — Progress Notes (Signed)
[facility] CSW Progress Notes  Referral received to contact patient for emotional support/resources.  Left VM w my contact information and encouragement to return call.  Edwyna Shell, LCSW Clinical Social Worker Phone:  [phone]

## 2019-05-14 NOTE — Progress Notes (Signed)
Radiation Oncology         [phone] ________________________________  Initial Outpatient Consultation - Conducted via telephone due to current COVID-19 concerns for limiting patient exposure  I spoke with the patient to conduct this consult visit via telephone to spare the patient unnecessary potential exposure in the healthcare setting during the current COVID-19 pandemic. The patient was notified in advance and was offered a [place] meeting to allow for face to face communication but unfortunately reported that they did not have the appropriate resources/technology to support such a visit and instead preferred to proceed with a telephone consult.  ________________________________  Name: Kara [name]        MRN: [id number]  Date of Service: 05/13/2019 DOB: 1966/04/23  [id number], Bill Salinas, MD  Erroll Luna, MD     REFERRING PHYSICIAN: Erroll Luna, MD   DIAGNOSIS: The encounter diagnosis was Malignant neoplasm of lower-inner quadrant of left breast in female, estrogen receptor positive ([place]).   HISTORY OF PRESENT ILLNESS: Kara [name] is a 53 y.o. female seen in the multidisciplinary breast clinic for a new diagnosis of left breast cancer. The patient was noted to have a screening detected group of calcifications in the left breast that on diagnostic imaging measured 4 x 4 x 3 cm, and her axilla was negative for adenopathy. 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 She is contacted by phone to discuss treatment for her disease. She has been scheduled for a lumpectomy on 06/10/2019.     PREVIOUS RADIATION THERAPY: No   PAST MEDICAL HISTORY:  Past Medical History:  Diagnosis Date  . Anemia   . Carpal tunnel syndrome   . HA (headache)   . Heart murmur   . Hypertension   . Migraines   . OSA (obstructive sleep apnea)    Mild OSA AHI 9.3/hr now on CPAP at 6CM H2O  . Sleep apnea        PAST SURGICAL HISTORY: Past  Surgical History:  Procedure Laterality Date  . ABLATION    . CESAREAN SECTION     x2  . tummy tuck       FAMILY HISTORY:  Family History  Problem Relation Age of Onset  . Hypertension Mother   . Breast cancer Sister 28     SOCIAL HISTORY:  reports that she has never smoked. She has never used smokeless tobacco. She reports current alcohol use. She reports that she does not use drugs. The patient is married and works for a [employer] safety [employer].   ALLERGIES: Hydrocil [psyllium], Imitrex [sumatriptan], and Other   MEDICATIONS:  Current Outpatient Medications  Medication Sig Dispense Refill  . hydrochlorothiazide (HYDRODIURIL) 25 MG tablet Take 25 mg by mouth daily.    . Multiple Vitamins-Minerals (HAIR SKIN AND NAILS FORMULA PO) Take as directed    . naproxen (NAPROSYN) 500 MG tablet Take 500 mg by mouth 2 (two) times daily as needed (PAIN). \    . Biotin w/ Vitamins C & E (HAIR/SKIN/NAILS PO) Take by mouth. 1 gummie daily    . cyclobenzaprine (FLEXERIL) 10 MG tablet Take 10 mg by mouth 3 (three) times daily as needed for muscle spasms.    . IRON PO Take 1 capsule by mouth daily. EQUATE OTC    . meloxicam (MOBIC) 15 MG tablet Take 15 mg by mouth as needed for pain (knee).    . naratriptan (AMERGE) 2.5 MG tablet Take 2.5 mg by mouth as needed  for migraine. Take one (1) tablet at onset of headache; if returns or does not resolve, may repeat after 4 hours; do not exceed five (5) mg in 24 hours.    . topiramate (TOPAMAX) 50 MG tablet Take one half tablet in the morning and one whole tablet at night. May increase to one whole tablet twice daily if needed. (Patient not taking: Reported on 05/13/2019) 60 tablet 6   No current facility-administered medications for this encounter.      REVIEW OF SYSTEMS: On review of systems, the patient reports that she is doing poorly from a mental state. She has been very upset by her diagnosis, and is having a hard time with work  understanding her focus on her health. She states she's falling behind at work because of the time she's thinking about her cancer. She reports she's concerned she may loose her job and is seeking options of disability. She denies any chest pain, shortness of breath, cough, fevers, chills, night sweats, unintended weight changes. She denies any bowel or bladder disturbances, and denies abdominal pain, nausea or vomiting. She denies any new musculoskeletal or joint aches or pains. A complete review of systems is obtained and is otherwise negative.     PHYSICAL EXAM:  Wt Readings from Last 3 Encounters:  05/13/19 183 lb (83 kg)  03/20/18 181 lb 12.8 oz (82.5 kg)  11/16/14 173 lb (78.5 kg)   Unable to assess due to nature of visit.   ECOG = 1  0 - Asymptomatic (Fully active, able to carry on all predisease activities without restriction)  1 - Symptomatic but completely ambulatory (Restricted in physically strenuous activity but ambulatory and able to carry out work of a light or sedentary nature. For example, light housework, office work)  2 - Symptomatic, <50% in bed during the day (Ambulatory and capable of all self care but unable to carry out any work activities. Up and about more than 50% of waking hours)  3 - Symptomatic, >50% in bed, but not bedbound (Capable of only limited self-care, confined to bed or chair 50% or more of waking hours)  4 - Bedbound (Completely disabled. Cannot carry on any self-care. Totally confined to bed or chair)  5 - Death   Eustace Pen MM, Creech RH, Tormey DC, et al. [id number]). "Toxicity and response criteria of the [facility]". [place] Oncol. 5 (6): 649-55    LABORATORY DATA:  No results found for: WBC, HGB, HCT, MCV, PLT Lab Results  Component Value Date   NA 138 11/02/2014   K 3.8 11/02/2014   CL 100 11/02/2014   CO2 26 11/02/2014   No results found for: ALT, AST, GGT, ALKPHOS, BILITOT    RADIOGRAPHY: Mm Digital  Diagnostic Unilat L  Result Date: 04/17/2019 CLINICAL DATA:  Recall from screening mammography with tomosynthesis, calcifications involving the LOWER INNER QUADRANT of the LEFT breast. Family history of breast cancer in her sister who was diagnosed at age 80. EXAM: DIGITAL DIAGNOSTIC LEFT MAMMOGRAM WITH CAD COMPARISON:  Previous exam(s). ACR Breast Density Category c: The breast tissue is heterogeneously dense, which may obscure small masses. FINDINGS: Standard spot magnification CC and mediolateral views of the LEFT breast calcifications and a standard 2D full field mediolateral view of the LEFT breast were obtained. Spot magnification views confirm amorphous and fine heterogeneous calcifications in the LOWER INNER QUADRANT at MIDDLE to POSTERIOR depth which span approximately 4 x 4 x 3 cm (AP x coronal x craniocaudal), some  of which are more tightly grouped. There is no definite layering of the calcifications on the mediolateral image. The calcifications are in a segmental distribution. No suspicious findings elsewhere in the LEFT breast on the full field mediolateral image. Mammographic images were processed with CAD. IMPRESSION: Indeterminate calcifications involving the LOWER INNER QUADRANT of the LEFT breast. RECOMMENDATION: Stereotactic tomosynthesis core needle biopsy of the LEFT breast calcifications. The stereotactic tomosynthesis biopsy procedure was discussed with patient her questions were answered. She has agreed to proceed in the biopsy has been scheduled for Wednesday, July 8 at 8:30 a.m. I have discussed the findings and recommendations with the patient. BI-RADS CATEGORY  4: Suspicious. Electronically Signed   By: Evangeline Dakin M.D.   On: 04/17/2019 09:22   Mm Clip Placement Left  Result Date: 04/23/2019 CLINICAL DATA:  Stereotactic left breast biopsy was performed of calcifications in the lower inner quadrant. EXAM: DIAGNOSTIC LEFT MAMMOGRAM POST STEREOTACTIC BIOPSY COMPARISON:  Previous  exam(s). FINDINGS: Mammographic images were obtained following stereotactic guided biopsy of calcifications in the lower inner quadrant of the left breast. Coil shaped biopsy clip is in satisfactory position. Post biopsy changes are seen in the lower inner quadrant. IMPRESSION: Satisfactory position of coil shaped biopsy clip. Final Assessment: Post Procedure Mammograms for Marker Placement Electronically Signed   By: Curlene Dolphin M.D.   On: 04/23/2019 09:05   Mm Lt Breast Bx W Loc Dev 1st Lesion Image Bx Spec Stereo Guide  Addendum Date: 04/25/2019   ADDENDUM REPORT: 04/25/2019 07:43 ADDENDUM: Pathology revealed LOW GRADE DUCTAL CARCINOMA IN SITU WITH CALCIFICATIONS, FIBROCYSTIC AND FIBROADENOMATOID CHANGE of the Left breast, lower inner quadrant. This was found to be concordant by Dr. Curlene Dolphin. Pathology results were discussed with the patient by telephone. The patient reported doing well after the biopsy with tenderness at the site. Post biopsy instructions and care were reviewed and questions were answered. The patient was encouraged to call [facility] for any additional concerns. Surgical consultation has been arranged with Dr. Erroll Luna at [facility] on May 02, 2019. Pathology results reported by Terie Purser, RN on 04/25/2019. Electronically Signed   By: Curlene Dolphin M.D.   On: 04/25/2019 07:43   Result Date: 04/25/2019 CLINICAL DATA:  Calcifications identified in the lower inner quadrant of the left breast and recommended for stereotactic biopsy. EXAM: LEFT BREAST STEREOTACTIC CORE NEEDLE BIOPSY COMPARISON:  Previous exams. FINDINGS: The patient and I discussed the procedure of stereotactic-guided biopsy including benefits and alternatives. We discussed the high likelihood of a successful procedure. We discussed the risks of the procedure including infection, bleeding, tissue injury, clip migration, and inadequate sampling. Informed written consent  was given. The usual time out protocol was performed immediately prior to the procedure. Using sterile technique and 1% Lidocaine with and without epinephrine as local anesthetic, under stereotactic guidance, a 9 gauge vacuum assisted device was used to perform core needle biopsy of calcifications in the lower inner quadrant of the left breast using a medial approach. Specimen radiograph was performed showing multiple calcifications in multiple specimens. Specimens with calcifications are identified for pathology. Lesion quadrant: Lower inner quadrant At the conclusion of the procedure, a coil tissue marker clip was deployed into the biopsy cavity. Follow-up 2-view mammogram was performed and dictated separately. IMPRESSION: Stereotactic-guided biopsy of the left breast. No apparent complications. Electronically Signed: By: Curlene Dolphin M.D. On: 04/23/2019 09:01       IMPRESSION/PLAN: 1. Low Grade, ER/PR positive DCIS of  the left breast. Dr. Lisbeth Renshaw discusses the pathology findings and reviews the nature of non invasive left breast disease. The consensus from the breast conference includes consideration of COMET trial versus breast conservation with lumpectomy, followed by external radiotherapy to the breast followed by antiestrogen therapy. The patient has decided to proceed with lumpectomy and is scheduled on 06/10/2019 to proceed. We discussed the risks, benefits, short, and long term effects of radiotherapy, and the patient is interested in proceeding. Dr. Lisbeth Renshaw discusses the delivery and logistics of radiotherapy and anticipates a course of 6 1/2 weeks of radiotherapy. We will see her back about 2 weeks after surgery to discuss the simulation process and anticipate we starting radiotherapy about 4-6 weeks after surgery.  2.  Anxiety and Depression regarding her medical diagnosis. I have reached out to our chaplain and social worker to see if they can be of assistance in helping the patient with counseling  and disability resources.     Given current concerns for patient exposure during the COVID-19 pandemic, this encounter was conducted via telephone.  The patient has given verbal consent for this type of encounter. The time spent during this encounter was 60 minutes and 50% of that time was spent in the coordination of her care. The attendants for this meeting included Dr. Lisbeth Renshaw,  Shona Simpson, [facility] and [facility]  During the encounter, Dr. Lisbeth Renshaw and Shona Simpson [facility] were located at [facility] Radiation Oncology Department.  Gionni Freese  was located at home.   The above documentation reflects my direct findings during this shared patient visit. Please see the separate note by Dr. Lisbeth Renshaw on this date for the remainder of the patient's plan of care.    Carola Rhine, PAC

## 2019-05-15 ENCOUNTER — Encounter: Payer: Self-pay | Admitting: General Practice

## 2019-05-15 NOTE — Progress Notes (Signed)
[facility] CSW Progress Notes  Referral sent to [employer] for mentor match.  Edwyna Shell, LCSW Clinical Social Worker Phone:  [phone]

## 2019-05-19 NOTE — Progress Notes (Signed)
[place]  [phone] [phone]     ID: Kara [name] DOB: 1965-12-11  MR#: [id number]  ELF#:[id number]  Patient Care Team: Aretta Nip, MD as PCP - General (Family Medicine) Sueanne Margarita, MD as Consulting Physician (Cardiology) Irene Limbo, MD as Consulting Physician (Plastic Surgery) Erroll Luna, MD as Consulting Physician (General Surgery) Keaghan Bowens, Virgie Dad, MD as Consulting Physician (Oncology) Kyung Rudd, MD as Consulting Physician (Radiation Oncology) Janyth Pupa, DO as Consulting Physician (Obstetrics and Gynecology) Chauncey Cruel, MD OTHER MD:  CHIEF COMPLAINT: Estrogen receptor positive ductal carcinoma in situ  CURRENT TREATMENT: Definitive surgery pending  HISTORY OF CURRENT ILLNESS: Kara [name] had routine screening mammography on 04/11/2019 showing a possible abnormality in the left breast. She underwent bilateral diagnostic mammography with tomography at [facility] on 04/17/2019 showing: breast density category C; indeterminate 4 cm calcifications in the lower-inner quadrant of the left breast.   Accordingly on 04/23/2019 she proceeded to biopsy of the left breast area in question. The pathology from this procedure ([id number]) showed: ductal carcinoma in situ with calcifications, low grade; fibrocystic and fibroadenomatoid change.  Prognostic indicators significant for: estrogen receptor, 90% positive with strong staining intensity and progesterone receptor, 60% positive with moderate staining intensity.   The patient's subsequent history is as detailed below.   INTERVAL HISTORY: Kara [name] was evaluated in the breast cancer clinic on 05/20/2019. Her case was also presented at the multidisciplinary breast cancer conference 04/30/2019. At that time a preliminary plan was proposed: Consideration of the COMET trial versus breast conserving surgery, adjuvant radiation, consider antiestrogens  She met with  radiation oncology on 05/13/2019.  She is scheduled to undergo left breast lumpectomy on 06/10/2019 under Dr. Brantley Stage. This will be followed by reconstruction on 06/17/2019 under Dr. Iran Planas then radiation under Dr. Lisbeth Renshaw..  She underwent endometrium biopsy on 04/25/2019. Pathology from the procedure ([id number]) showed a benign endometrioid polyp. She is scheduled to undergo hysteroscopy on 06/04/2019.   REVIEW OF SYSTEMS: Kara [name] reports there were no specific symptoms leading to the original mammogram, which was routinely scheduled. The patient denies has been having more frequent migraines because of the stress.  She finds it difficult to concentrate.  She is very committed to her work and if she is working with a client she does not want to rush the discussion but at the same time she will be aware that she has an appointment and it is getting late and she is going to be late for her appointment.  She denies visual changes, nausea, vomiting, stiff neck, dizziness, or gait imbalance. There has been no cough, phlegm production, or pleurisy, no chest pain or pressure, and no change in bowel or bladder habits. The patient denies fever, rash, bleeding, unexplained fatigue or unexplained weight loss. A detailed review of systems was otherwise entirely negative.   PAST MEDICAL HISTORY: Past Medical History:  Diagnosis Date   Anemia    Carpal tunnel syndrome    Depression    HA (headache)    Heart murmur    Hypertension    Migraines    OSA (obstructive sleep apnea)    Mild OSA AHI 9.3/hr now on CPAP at 6CM H2O   Sleep apnea     PAST SURGICAL HISTORY: Past Surgical History:  Procedure Laterality Date   ABLATION     CESAREAN SECTION     x2   tummy tuck    right ankle surgery as a kid  FAMILY HISTORY: Family History  Problem Relation Age of Onset   Hypertension Mother    Breast cancer Sister 52   Patient's father is living at age 57-80. Patient's mother is also living at age  39. (as of 05/2019) The patient's maternal half-sister was diagnosed with breast cancer at age 51. Another half-sister had breast cancer before the age of 18 and has since passed away.  The patient has 2 half siblings on her mother's [name] and 3 half siblings on her father's. The patient denies a family hx of ovarian, prostate or pancreatic cancer.   GYNECOLOGIC HISTORY:  No LMP recorded. Patient has had an ablation. Menarche: 53 years old Age at first live birth: 53 years old [place] P 2 LMP she is currently spotting Contraceptive: used for ~25 years with no problems HRT n/a  Hysterectomy? no BSO? 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 She is married but separated. Husband Kara [name] is a Barrister's clerk. They have not completed any separation paperwork at this time and she describes him as friendly and supportive. She lives at home with her youngest son Kara [name], age 4, a Ship broker.  Older son Kara [name], age 53, lives in [place] as a Ship broker of [employer] in liberal arts.  The patient attends [employer].    ADVANCED DIRECTIVES: Kara [name] is comfortable with her husband Kara [name] as her HCPOA   HEALTH MAINTENANCE: Social History   Tobacco Use   Smoking status: Never Smoker   Smokeless tobacco: Never Used  Substance Use Topics   Alcohol use: Yes    Alcohol/week: 0.0 standard drinks   Drug use: No     Colonoscopy: states she is due for repeat  PAP: Due  Bone density: never done   Allergies  Allergen Reactions   Imitrex [Sumatriptan] Other (See Comments)    Nauseated - Feel like passing out - lack of therapeutic effect.   Hydrocil [Psyllium] Nausea And Vomiting   Other     Current Outpatient Medications  Medication Sig Dispense Refill   Biotin w/ Vitamins C & E (HAIR/SKIN/NAILS PO) Take by mouth. 1 gummie daily     buPROPion (WELLBUTRIN XL) 150 MG 24 hr tablet TAKE 1  TABLET BY MOUTH EVERY DAY IN THE MORNING     cyclobenzaprine (FLEXERIL) 10 MG tablet Take 10 mg by mouth 3 (three) times daily as needed for muscle spasms.     hydrochlorothiazide (HYDRODIURIL) 25 MG tablet Take 25 mg by mouth daily.     Multiple Vitamins-Minerals (HAIR SKIN AND NAILS FORMULA PO) Take as directed     naproxen (NAPROSYN) 500 MG tablet Take 500 mg by mouth 2 (two) times daily as needed (PAIN). \     IRON PO Take 1 capsule by mouth daily. EQUATE OTC     meloxicam (MOBIC) 15 MG tablet Take 15 mg by mouth as needed for pain (knee).     naratriptan (AMERGE) 2.5 MG tablet Take 2.5 mg by mouth as needed for migraine. Take one (1) tablet at onset of headache; if returns or does not resolve, may repeat after 4 hours; do not exceed five (5) mg in 24 hours.     No current facility-administered medications for this visit.     OBJECTIVE: Middle-aged African-American woman who appears stated age  9:   05/20/19 1623  BP: 120/63  Pulse: 68  SpO2: 99%     Body mass index  is 30.84 kg/m.   Wt Readings from Last 3 Encounters:  05/20/19 182 lb 8 oz (82.8 kg)  05/13/19 183 lb (83 kg)  03/20/18 181 lb 12.8 oz (82.5 kg)      ECOG FS:1 - Symptomatic but completely ambulatory  Ocular: Sclerae unicteric, pupils round and equal Wearing a mask Lymphatic: No cervical or supraclavicular adenopathy Lungs no rales or rhonchi Heart regular rate and rhythm Abd soft, nontender, positive bowel sounds MSK no focal spinal tenderness, no joint edema Neuro: non-focal, well-oriented, appropriate affect Breasts: The right breast is unremarkable.  I do not palpate a mass in the left breast.  Both axillae are benign.   LAB RESULTS:  CMP     Component Value Date/Time   NA 138 11/02/2014 0932   K 3.8 11/02/2014 0932   CL 100 11/02/2014 0932   CO2 26 11/02/2014 0932   GLUCOSE 95 11/02/2014 0932   BUN 16 11/02/2014 0932   CREATININE 0.84 11/02/2014 0932   CALCIUM 9.2 11/02/2014 0932    GFRNONAA 83 11/02/2014 0932   GFRAA 96 11/02/2014 0932    No results found for: TOTALPROTELP, ALBUMINELP, A1GS, A2GS, BETS, BETA2SER, GAMS, MSPIKE, SPEI  No results found for: KPAFRELGTCHN, LAMBDASER, KAPLAMBRATIO  No results found for: WBC, NEUTROABS, HGB, HCT, MCV, PLT  '@LASTCHEMISTRY' @  No results found for: LABCA2  No components found for: KAJGOT157  No results for input(s): INR in the last 168 hours.  No results found for: LABCA2  No results found for: WIO035  No results found for: DHR416  No results found for: LAG536  No results found for: CA2729  No components found for: HGQUANT  No results found for: CEA1 / No results found for: CEA1   No results found for: AFPTUMOR  No results found for: CHROMOGRNA  No results found for: PSA1  No visits with results within 3 Day(s) from this visit.  Latest known visit with results is:  Office Visit on 11/02/2014  Component Date Value Ref Range Status   Glucose 11/02/2014 95  65 - 99 mg/dL Final   BUN 11/02/2014 16  6 - 24 mg/dL Final   Creatinine, Ser 11/02/2014 0.84  0.57 - 1.00 mg/dL Final   GFR calc non Af Amer 11/02/2014 83  >59 mL/min/1.73 Final   GFR calc Af Amer 11/02/2014 96  >59 mL/min/1.73 Final   BUN/Creatinine Ratio 11/02/2014 19  9 - 23 Final   Sodium 11/02/2014 138  134 - 144 mmol/L Final   Potassium 11/02/2014 3.8  3.5 - 5.2 mmol/L Final   Chloride 11/02/2014 100  97 - 108 mmol/L Final   CO2 11/02/2014 26  18 - 29 mmol/L Final   Calcium 11/02/2014 9.2  8.7 - 10.2 mg/dL Final    (this displays the last labs from the last 3 days)  No results found for: TOTALPROTELP, ALBUMINELP, A1GS, A2GS, BETS, BETA2SER, GAMS, MSPIKE, SPEI (this displays SPEP labs)  No results found for: KPAFRELGTCHN, LAMBDASER, KAPLAMBRATIO (kappa/lambda light chains)  No results found for: HGBA, HGBA2QUANT, HGBFQUANT, HGBSQUAN (Hemoglobinopathy evaluation)   No results found for: LDH  No results found for: IRON,  TIBC, IRONPCTSAT (Iron and TIBC)  No results found for: FERRITIN  Urinalysis No results found for: COLORURINE, APPEARANCEUR, LABSPEC, PHURINE, GLUCOSEU, HGBUR, BILIRUBINUR, KETONESUR, PROTEINUR, UROBILINOGEN, NITRITE, LEUKOCYTESUR   STUDIES: Mm Clip Placement Left  Result Date: 04/23/2019 CLINICAL DATA:  Stereotactic left breast biopsy was performed of calcifications in the lower inner quadrant. EXAM: DIAGNOSTIC LEFT MAMMOGRAM POST STEREOTACTIC BIOPSY COMPARISON:  Previous exam(s). FINDINGS: Mammographic images were obtained following stereotactic guided biopsy of calcifications in the lower inner quadrant of the left breast. Coil shaped biopsy clip is in satisfactory position. Post biopsy changes are seen in the lower inner quadrant. IMPRESSION: Satisfactory position of coil shaped biopsy clip. Final Assessment: Post Procedure Mammograms for Marker Placement Electronically Signed   By: Curlene Dolphin M.D.   On: 04/23/2019 09:05   Mm Lt Breast Bx W Loc Dev 1st Lesion Image Bx Spec Stereo Guide  Addendum Date: 04/25/2019   ADDENDUM REPORT: 04/25/2019 07:43 ADDENDUM: Pathology revealed LOW GRADE DUCTAL CARCINOMA IN SITU WITH CALCIFICATIONS, FIBROCYSTIC AND FIBROADENOMATOID CHANGE of the Left breast, lower inner quadrant. This was found to be concordant by Dr. Curlene Dolphin. Pathology results were discussed with the patient by telephone. The patient reported doing well after the biopsy with tenderness at the site. Post biopsy instructions and care were reviewed and questions were answered. The patient was encouraged to call [facility] for any additional concerns. Surgical consultation has been arranged with Dr. Erroll Luna at [facility] on May 02, 2019. Pathology results reported by Terie Purser, RN on 04/25/2019. Electronically Signed   By: Curlene Dolphin M.D.   On: 04/25/2019 07:43   Result Date: 04/25/2019 CLINICAL DATA:  Calcifications identified in the  lower inner quadrant of the left breast and recommended for stereotactic biopsy. EXAM: LEFT BREAST STEREOTACTIC CORE NEEDLE BIOPSY COMPARISON:  Previous exams. FINDINGS: The patient and I discussed the procedure of stereotactic-guided biopsy including benefits and alternatives. We discussed the high likelihood of a successful procedure. We discussed the risks of the procedure including infection, bleeding, tissue injury, clip migration, and inadequate sampling. Informed written consent was given. The usual time out protocol was performed immediately prior to the procedure. Using sterile technique and 1% Lidocaine with and without epinephrine as local anesthetic, under stereotactic guidance, a 9 gauge vacuum assisted device was used to perform core needle biopsy of calcifications in the lower inner quadrant of the left breast using a medial approach. Specimen radiograph was performed showing multiple calcifications in multiple specimens. Specimens with calcifications are identified for pathology. Lesion quadrant: Lower inner quadrant At the conclusion of the procedure, a coil tissue marker clip was deployed into the biopsy cavity. Follow-up 2-view mammogram was performed and dictated separately. IMPRESSION: Stereotactic-guided biopsy of the left breast. No apparent complications. Electronically Signed: By: Curlene Dolphin M.D. On: 04/23/2019 09:01    ELIGIBLE FOR AVAILABLE RESEARCH PROTOCOL: opted against COMET  ASSESSMENT: 53 y.o.  [place] woman status post left breast biopsy 04/23/2023 ductal carcinoma in situ, low-grade, estrogen and progesterone receptor positive  (1) definitive surgery pending  (2) adjuvant radiation to follow  (3) consider prophylactic antiestrogens  (4) genetics testing pending  PLAN: I spent approximately 60 minutes face to face with Kara [name] with more than 50% of that time spent in counseling and coordination of care.   Raychell understands that in noninvasive ductal carcinoma,  also called ductal carcinoma in situ ("DCIS") the breast cancer cells remain trapped in the ducts were they started. They cannot travel to a vital organ. For that reason these cancers in themselves are not life-threatening.  If the whole breast is removed then all the ducts are removed and since the cancer cells are trapped in the ducts, the cure rate with mastectomy for noninvasive breast cancer is approximately 99%. Nevertheless we recommend lumpectomy, because there is no survival advantage to mastectomy and because the cosmetic  result is generally superior with breast conservation.  Since the patient is keeping her breast, there will be some risk of recurrence. The recurrence can only be in the same breast since, again, the cells are trapped in the ducts. There is no connection from one breast to the other. The risk of local recurrence is cut by more than half with radiation, which is standard in this situation.  In estrogen receptor positive cancers like Kashawna's, anti-estrogens can also be considered. They will further reduce the risk of recurrence by one half. In addition anti-estrogens will lower the risk of a new breast cancer developing in either breast, also by one half.   Analysia also qualifies for genetics testing. In patients who carry a deleterious mutation [for example in a  BRCA gene], the risk of a new breast cancer developing in the future may be sufficiently great that the patient may choose bilateral mastectomies. However if she wishes to keep her breasts in that situation it is safe to do so. That would require intensified screening, which generally means not only yearly mammography but a yearly breast MRI as well.   Accordingly the overall plan is for surgery, followed by radiation, then anti-estrogens.  Saron understands her long-term prognosis is very good.  Short-term however she is overwhelmed.  She is basically a single parent at this time.  She has multiple doctor appointments and  much technical information coming her way which is difficult to put together and digest.  She as many of my patients do at this stage of the game finds it very hard to concentrate.  She is very concerned that she will not do a good job in her work while all this is going on and I think her best bet is to go on 3 months disability.  That means she will have all her surgery done and completed her radiation before she goes back to work.  She is also at high risk regarding the pandemic because of course of the cancer and its treatment and also because of underlying conditions such as her sleep apnea.  She gave her some disability papers to fill out which we will be glad to complete for her.  Eufelia has a good understanding of the overall plan. She agrees with it. She knows the goal of treatment in her case is cure. She will call with any problems that may develop before her next visit here.   Chauncey Cruel, MD   05/20/2019 5:40 PM Medical Oncology and Hematology [facility] [address] [place], [place] [address] Tel. [phone]    Fax. [phone]   This document serves as a record of services personally performed by Lurline Del, MD. It was created on his behalf by Wilburn Mylar, a trained medical scribe. The creation of this record is based on the scribe's personal observations and the provider's statements to them.   I, Lurline Del MD, have reviewed the above documentation for accuracy and completeness, and I agree with the above.

## 2019-05-20 ENCOUNTER — Other Ambulatory Visit: Payer: Self-pay | Admitting: *Deleted

## 2019-05-20 ENCOUNTER — Encounter: Payer: Self-pay | Admitting: Oncology

## 2019-05-20 ENCOUNTER — Inpatient Hospital Stay: Payer: 59 | Attending: Oncology | Admitting: Oncology

## 2019-05-20 ENCOUNTER — Other Ambulatory Visit: Payer: Self-pay

## 2019-05-20 VITALS — BP 120/63 | HR 68 | Ht 64.5 in | Wt 182.5 lb

## 2019-05-20 DIAGNOSIS — Z79899 Other long term (current) drug therapy: Secondary | ICD-10-CM | POA: Diagnosis not present

## 2019-05-20 DIAGNOSIS — Z170 Estrogen receptor positive status [ER+]: Secondary | ICD-10-CM

## 2019-05-20 DIAGNOSIS — Z803 Family history of malignant neoplasm of breast: Secondary | ICD-10-CM | POA: Diagnosis not present

## 2019-05-20 DIAGNOSIS — Z8249 Family history of ischemic heart disease and other diseases of the circulatory system: Secondary | ICD-10-CM | POA: Diagnosis not present

## 2019-05-20 DIAGNOSIS — D0592 Unspecified type of carcinoma in situ of left breast: Secondary | ICD-10-CM | POA: Diagnosis present

## 2019-05-20 DIAGNOSIS — C50312 Malignant neoplasm of lower-inner quadrant of left female breast: Secondary | ICD-10-CM

## 2019-05-21 ENCOUNTER — Telehealth: Payer: Self-pay | Admitting: Adult Health

## 2019-05-21 ENCOUNTER — Telehealth: Payer: Self-pay | Admitting: Genetic Counselor

## 2019-05-21 NOTE — Telephone Encounter (Signed)
I left a message regarding schedule  

## 2019-05-21 NOTE — Telephone Encounter (Signed)
Scheduled appt per 8/05 sch message - unable to reach pt . Left message with appt date and time

## 2019-05-22 ENCOUNTER — Other Ambulatory Visit: Payer: Self-pay | Admitting: Genetic Counselor

## 2019-05-22 ENCOUNTER — Inpatient Hospital Stay (HOSPITAL_BASED_OUTPATIENT_CLINIC_OR_DEPARTMENT_OTHER): Payer: 59 | Admitting: Genetic Counselor

## 2019-05-22 ENCOUNTER — Inpatient Hospital Stay: Payer: 59

## 2019-05-22 ENCOUNTER — Other Ambulatory Visit: Payer: Self-pay

## 2019-05-22 ENCOUNTER — Encounter: Payer: Self-pay | Admitting: Genetic Counselor

## 2019-05-22 DIAGNOSIS — Z803 Family history of malignant neoplasm of breast: Secondary | ICD-10-CM

## 2019-05-22 DIAGNOSIS — C50312 Malignant neoplasm of lower-inner quadrant of left female breast: Secondary | ICD-10-CM

## 2019-05-22 DIAGNOSIS — Z170 Estrogen receptor positive status [ER+]: Secondary | ICD-10-CM

## 2019-05-22 NOTE — Progress Notes (Signed)
REFERRING PROVIDER: Chauncey Cruel, MD [address] [place],  [place] [address]  PRIMARY PROVIDER:  Aretta Nip, MD  PRIMARY REASON FOR VISIT:  1. Malignant neoplasm of lower-inner quadrant of left breast in female, estrogen receptor positive ([place])   2. Family history of breast cancer      HISTORY OF PRESENT ILLNESS:   Kara [name], a 53 y.o. female, was seen for a [facility] cancer genetics consultation at the request of Dr. Jana Hakim due to a personal and family history of breast cancer.  Kara [name] presents to clinic today to discuss the possibility of a hereditary predisposition to cancer, genetic testing, and to further clarify her future cancer risks, as well as potential cancer risks for family members.   In July 2020, at the age of 6, Kara [name] was diagnosed with DCIS cancer of the left breast. The treatment plan includes lumpectomy and radiation.  Lumpectomy is scheduled for 06/10/2019.Marland Kitchen     CANCER HISTORY:  Oncology History  Malignant neoplasm of lower-inner quadrant of left breast in female, estrogen receptor positive ([place])  04/30/2019 Initial Diagnosis   Malignant neoplasm of lower-inner quadrant of left breast in female, estrogen receptor positive ([place])   04/30/2019 Cancer Staging   Staging form: Breast, AJCC 8th Edition - Clinical: Stage 0 (cTis (DCIS), cN0, cM0, ER+, PR+) - Signed by Gardenia Phlegm, NP on 04/30/2019      RISK FACTORS:  Menarche was at age 36.  First live birth at age 60.  OCP use for approximately 25-30 years.  Ovaries intact: yes.  Hysterectomy: no.  Menopausal status: perimenopausal.  HRT use: 0 years. Colonoscopy: yes; normal. Mammogram within the last year: yes. Number of breast biopsies: 1. Up to date with pelvic exams: yes. Any excessive radiation exposure in the past: no  Past Medical History:  Diagnosis Date  . Anemia   . Carpal tunnel syndrome   . Depression   . Family history of breast cancer    . HA (headache)   . Heart murmur   . Hypertension   . Migraines   . OSA (obstructive sleep apnea)    Mild OSA AHI 9.3/hr now on CPAP at 6CM H2O  . Sleep apnea     Past Surgical History:  Procedure Laterality Date  . ABLATION    . CESAREAN SECTION     x2  . tummy tuck      Social History   Socioeconomic History  . Marital status: Married    Spouse name: [name]  . Number of children: 2  . Years of education: college  . Highest education level: Not on file  Occupational History    Comment: [employer]  Social Needs  . Financial resource strain: Not on file  . Food insecurity    Worry: Not on file    Inability: Not on file  . Transportation needs    Medical: No    Non-medical: No  Tobacco Use  . Smoking status: Never Smoker  . Smokeless tobacco: Never Used  Substance and Sexual Activity  . Alcohol use: Yes    Alcohol/week: 0.0 standard drinks  . Drug use: No  . Sexual activity: Yes    Birth control/protection: None  Lifestyle  . Physical activity    Days per week: Not on file    Minutes per session: Not on file  . Stress: Not on file  Relationships  . Social connections    Talks on phone: Not on file  Gets together: Not on file    Attends religious service: Not on file    Active member of club or organization: Not on file    Attends meetings of clubs or organizations: Not on file    Relationship status: Not on file  Other Topics Concern  . Not on file  Social History Narrative   Patient lives at home with her husband and two children.   Patient works full time for [employer].   Education college.   Right handed.   Caffeine two cup daily coffee.     FAMILY HISTORY:  We obtained a detailed, 4-generation family history.  Significant diagnoses are listed below: Family History  Problem Relation Age of Onset  . Hypertension Mother   . Breast cancer Sister 70  . Breast cancer Maternal Aunt        dx 50s-60s  . Lung cancer Maternal Uncle   . Breast cancer  Other        MGM's sister  . Other Sister 60       MVA  . Breast cancer Other        Mother's maternal cousins - x3    The patient has two sons who are cancer free.  She has two maternal half sisters and a maternal half brother.  One sister died at 70 in a car accident and a sister who developed breast cancer at 5.  There are three paternal half siblings who are cancer free.  Both parents are living.  The patient's father is living and in poor health.  He did not raise the patient and therefore she does not have information on his side of the family.  The patient's mother is cancer free at 40.  She has a full brother who was diagnosed with lung cancer yesterday.  It is unknown if his cancer is a primary cancer or mestastis.  She has a maternal half sister who had breast cancer in her 45's-70's, and many paternal half siblings one who had either stomach or pancreatic cancer.  The maternal grandparents are deceased.  The grandmother had a sister who had breast cancer over age 39's, a brother who had a daughter with breast cancer and another sister who had a daughter and three granddaughters with breast cancer.  Kara [name] is unaware of previous family history of genetic testing for hereditary cancer risks. Patient's maternal ancestors are of African American descent, and paternal ancestors are of African American descent. There is no reported Ashkenazi Jewish ancestry. There is no known consanguinity.  GENETIC COUNSELING ASSESSMENT: Kara [name] is a 53 y.o. female with a personal and family history of breast cancer which is somewhat suggestive of hereditary breast and ovarian cancer syndrome and predisposition to cancer given the number of family members with breast cancer in the family. We, therefore, discussed and recommended the following at today's visit.   DISCUSSION: We discussed that 5 - 10% of breast cancer is hereditary, with most cases associated with BRCA mutations.  There are other  genes that can be associated with hereditary breast cancer syndromes.  These include ATM, CHEK2 and PALB2.  We discussed that testing is beneficial for several reasons including knowing how to follow individuals after completing their treatment, identifying whether potential treatment options such as PARP inhibitors would be beneficial, and understand if other family members could be at risk for cancer and allow them to undergo genetic testing.   We reviewed the characteristics, features and inheritance patterns of hereditary cancer  syndromes. We also discussed genetic testing, including the appropriate family members to test, the process of testing, insurance coverage and turn-around-time for results. We discussed the implications of a negative, positive and/or variant of uncertain significant result. In order to get genetic test results in a timely manner so that Kara [name] can use these genetic test results for surgical decisions, we recommended Kara [name] pursue genetic testing for the STAT panel. Once complete, we recommend Kara [name] pursue reflex genetic testing to the common hereditary cancer gene panel. The Common Hereditary Gene Panel offered by [facility] includes sequencing and/or deletion duplication testing of the following 48 genes: APC, ATM, AXIN2, BARD1, BMPR1A, BRCA1, BRCA2, BRIP1, CDH1, CDK4, CDKN2A (p14ARF), CDKN2A (p16INK4a), CHEK2, CTNNA1, DICER1, EPCAM (Deletion/duplication testing only), GREM1 (promoter region deletion/duplication testing only), KIT, MEN1, MLH1, MSH2, MSH3, MSH6, MUTYH, NBN, NF1, NHTL1, PALB2, PDGFRA, PMS2, POLD1, POLE, PTEN, RAD50, RAD51C, RAD51D, RNF43, SDHB, SDHC, SDHD, SMAD4, SMARCA4. STK11, TP53, TSC1, TSC2, and VHL.  The following genes were evaluated for sequence changes only: SDHA and HOXB13 c.251G>A variant only.   Based on Kara [name]'s personal and family history of cancer, she meets medical criteria for genetic testing. Despite that she meets criteria, she  may still have an out of pocket cost.   PLAN: After considering the risks, benefits, and limitations, Kara [name] provided informed consent to pursue genetic testing and the blood sample was sent to [facility] for analysis of the common hereditary caner panel. Results should be available within approximately 2-3 weeks' time, at which point they will be disclosed by telephone to Kara [name], as will any additional recommendations warranted by these results. Kara [name] will receive a summary of her genetic counseling visit and a copy of her results once available. This information will also be available in Epic.   Lastly, we encouraged Kara [name] to remain in contact with cancer genetics annually so that we can continuously update the family history and inform her of any changes in cancer genetics and testing that may be of benefit for this family.   Kara [name] questions were answered to her satisfaction today. Our contact information was provided should additional questions or concerns arise. Thank you for the referral and allowing [place] to share in the care of your patient.   Jakarie Pember P. Florene Glen, [place], [facility] Licensed, Insurance risk surveyor [email or web address] phone: [phone]  The patient was seen for a total of 60 minutes in face-to-face genetic counseling.  This patient was discussed with Drs. Magrinat, Lindi Adie and/or Burr Medico who agrees with the above.    _______________________________________________________________________ For Office Staff:  Number of people involved in session: 1 Was an Intern/ student involved with case: no

## 2019-05-28 ENCOUNTER — Encounter (HOSPITAL_BASED_OUTPATIENT_CLINIC_OR_DEPARTMENT_OTHER): Payer: Self-pay | Admitting: *Deleted

## 2019-05-28 ENCOUNTER — Other Ambulatory Visit: Payer: Self-pay

## 2019-05-29 ENCOUNTER — Encounter (HOSPITAL_BASED_OUTPATIENT_CLINIC_OR_DEPARTMENT_OTHER)
Admission: RE | Admit: 2019-05-29 | Discharge: 2019-05-29 | Disposition: A | Discharge status: Home / Self Care | Payer: 59 | Source: Ambulatory Visit | Attending: Obstetrics & Gynecology | Admitting: Obstetrics & Gynecology

## 2019-05-29 DIAGNOSIS — Z6830 Body mass index (BMI) 30.0-30.9, adult: Secondary | ICD-10-CM | POA: Diagnosis not present

## 2019-05-29 DIAGNOSIS — D0510 Intraductal carcinoma in situ of unspecified breast: Secondary | ICD-10-CM | POA: Diagnosis not present

## 2019-05-29 DIAGNOSIS — G4733 Obstructive sleep apnea (adult) (pediatric): Secondary | ICD-10-CM | POA: Diagnosis not present

## 2019-05-29 DIAGNOSIS — N840 Polyp of corpus uteri: Secondary | ICD-10-CM | POA: Diagnosis present

## 2019-05-29 DIAGNOSIS — F329 Major depressive disorder, single episode, unspecified: Secondary | ICD-10-CM | POA: Diagnosis not present

## 2019-05-29 DIAGNOSIS — N939 Abnormal uterine and vaginal bleeding, unspecified: Secondary | ICD-10-CM | POA: Diagnosis not present

## 2019-05-29 DIAGNOSIS — Z01810 Encounter for preprocedural cardiovascular examination: Secondary | ICD-10-CM | POA: Diagnosis not present

## 2019-05-29 DIAGNOSIS — I10 Essential (primary) hypertension: Secondary | ICD-10-CM | POA: Diagnosis not present

## 2019-05-29 DIAGNOSIS — N854 Malposition of uterus: Secondary | ICD-10-CM | POA: Diagnosis not present

## 2019-05-29 DIAGNOSIS — Z01812 Encounter for preprocedural laboratory examination: Secondary | ICD-10-CM | POA: Diagnosis not present

## 2019-05-29 LAB — COMPREHENSIVE METABOLIC PANEL
ALT: 17 U/L (ref 0–44)
AST: 23 U/L (ref 15–41)
Albumin: 3.4 g/dL — ABNORMAL LOW (ref 3.5–5.0)
Alkaline Phosphatase: 47 U/L (ref 38–126)
Anion gap: 10 (ref 5–15)
BUN: 6 mg/dL (ref 6–20)
CO2: 25 mmol/L (ref 22–32)
Calcium: 8.8 mg/dL — ABNORMAL LOW (ref 8.9–10.3)
Chloride: 103 mmol/L (ref 98–111)
Creatinine, Ser: 0.92 mg/dL (ref 0.44–1.00)
GFR calc Af Amer: >60 mL/min (ref >60)
GFR calc non Af Amer: >60 mL/min (ref >60)
Glucose, Bld: 126 mg/dL — ABNORMAL HIGH (ref 70–99)
Potassium: 4.2 mmol/L (ref 3.5–5.1)
Sodium: 138 mmol/L (ref 135–145)
Total Bilirubin: 0.3 mg/dL (ref 0.3–1.2)
Total Protein: 6.9 g/dL (ref 6.5–8.1)

## 2019-05-29 LAB — CBC
HCT: 33.5 % — ABNORMAL LOW (ref 36.0–46.0)
Hemoglobin: 11.2 g/dL — ABNORMAL LOW (ref 12.0–15.0)
MCH: 31.8 pg (ref 26.0–34.0)
MCHC: 33.4 g/dL (ref 30.0–36.0)
MCV: 95.2 fL (ref 80.0–100.0)
Platelets: 342 10*3/uL (ref 150–400)
RBC: 3.52 MIL/uL — ABNORMAL LOW (ref 3.87–5.11)
RDW: 13.2 % (ref 11.5–15.5)
WBC: 3.7 10*3/uL — ABNORMAL LOW (ref 4.0–10.5)
nRBC: 0 % (ref 0.0–0.2)

## 2019-05-29 NOTE — Progress Notes (Signed)

## 2019-05-30 ENCOUNTER — Telehealth: Payer: Self-pay | Admitting: Licensed Clinical Social Worker

## 2019-05-30 ENCOUNTER — Ambulatory Visit: Payer: Self-pay | Admitting: Licensed Clinical Social Worker

## 2019-05-30 ENCOUNTER — Telehealth: Payer: Self-pay | Admitting: *Deleted

## 2019-05-30 ENCOUNTER — Encounter: Payer: Self-pay | Admitting: Licensed Clinical Social Worker

## 2019-05-30 DIAGNOSIS — Z803 Family history of malignant neoplasm of breast: Secondary | ICD-10-CM

## 2019-05-30 DIAGNOSIS — Z1379 Encounter for other screening for genetic and chromosomal anomalies: Secondary | ICD-10-CM | POA: Insufficient documentation

## 2019-05-30 DIAGNOSIS — C50312 Malignant neoplasm of lower-inner quadrant of left female breast: Secondary | ICD-10-CM

## 2019-05-30 NOTE — Telephone Encounter (Signed)
"  Kara [name] [phone]).  I brought a short term disability form and now I need FMLA completed.  My company deadline to submit FMLA is 06-01-2019.  A guy transferred my calls.  Left messages for someone with a short name that did not identify as office manager role."  "Do not wish to leave a message; I am here now.  Will leave forms at desk."

## 2019-05-30 NOTE — Telephone Encounter (Signed)
Revealed negative genetic testing.   We discussed that we do not know why she has breast cancer or why there is cancer in the family. It could be due to a different gene that we are not testing, or something our current technology cannot pick up.  It will be important for her to keep in contact with genetics to learn if additional testing may be needed in the future.  

## 2019-05-30 NOTE — Progress Notes (Signed)
HPI:  Ms. Seibold was previously seen in the [facility] clinic due to a personal and family history of cancer and concerns regarding a hereditary predisposition to cancer. Please refer to our prior cancer genetics clinic note for more information regarding our discussion, assessment and recommendations, at the time. Ms. Canche recent genetic test results were disclosed to her, as were recommendations warranted by these results. These results and recommendations are discussed in more detail below.  CANCER HISTORY:  Oncology History  Malignant neoplasm of lower-inner quadrant of left breast in female, estrogen receptor positive ([place])  04/30/2019 Initial Diagnosis   Malignant neoplasm of lower-inner quadrant of left breast in female, estrogen receptor positive ([place])   04/30/2019 Cancer Staging   Staging form: Breast, AJCC 8th Edition - Clinical: Stage 0 (cTis (DCIS), cN0, cM0, ER+, PR+) - Signed by Gardenia Phlegm, NP on 04/30/2019    Genetic Testing   Negative genetic testing. No pathogenic variants identified on the [facility] STAT Panel + Common Hereditary Cancers Panel. The STAT Breast cancer panel offered by [facility] includes sequencing and rearrangement analysis for the following 9 genes:  ATM, BRCA1, BRCA2, CDH1, CHEK2, PALB2, PTEN, STK11 and TP53.  The Common Hereditary Cancers Panel offered by [facility] includes sequencing and/or deletion duplication testing of the following 48 genes: APC, ATM, AXIN2, BARD1, BMPR1A, BRCA1, BRCA2, BRIP1, CDH1, CDKN2A (p14ARF), CDKN2A (p16INK4a), CKD4, CHEK2, CTNNA1, DICER1, EPCAM (Deletion/duplication testing only), GREM1 (promoter region deletion/duplication testing only), KIT, MEN1, MLH1, MSH2, MSH3, MSH6, MUTYH, NBN, NF1, NHTL1, PALB2, PDGFRA, PMS2, POLD1, POLE, PTEN, RAD50, RAD51C, RAD51D, RNF43, SDHB, SDHC, SDHD, SMAD4, SMARCA4. STK11, TP53, TSC1, TSC2, and VHL.  The following genes were evaluated for sequence changes only: SDHA and  HOXB13 c.251G>A variant only. The report date is 05/30/2019.      FAMILY HISTORY:  We obtained a detailed, 4-generation family history.  Significant diagnoses are listed below: Family History  Problem Relation Age of Onset   Hypertension Mother    Breast cancer Sister 61   Breast cancer Maternal Aunt        dx 50s-60s   Lung cancer Maternal Uncle    Breast cancer Other        MGM's sister   Other Sister 74       MVA   Breast cancer Other        Mother's maternal cousins - x3   The patient has two sons who are cancer free.  She has two maternal half sisters and a maternal half brother.  One sister died at 57 in a car accident and a sister who developed breast cancer at 9.  There are three paternal half siblings who are cancer free.  Both parents are living.  The patient's father is living and in poor health.  He did not raise the patient and therefore she does not have information on his side of the family.  The patient's mother is cancer free at 69.  She has a full brother who was diagnosed with lung cancer yesterday.  It is unknown if his cancer is a primary cancer or mestastis.  She has a maternal half sister who had breast cancer in her 55's-70's, and many paternal half siblings one who had either stomach or pancreatic cancer.  The maternal grandparents are deceased.  The grandmother had a sister who had breast cancer over age 3's, a brother who had a daughter with breast cancer and another sister who had a daughter and three granddaughters with breast  cancer.  Ms. Stapleton is unaware of previous family history of genetic testing for hereditary cancer risks. Patient's maternal ancestors are of African American descent, and paternal ancestors are of African American descent. There is no reported Ashkenazi Jewish ancestry. There is no known consanguinity.  GENETIC TEST RESULTS: Genetic testing reported out on 05/30/2019 through the [facility] Breast Cancer STAT Panel + Common  Hereditary cancer panel found no pathogenic mutations. The STAT Breast cancer panel offered by [facility] includes sequencing and rearrangement analysis for the following 9 genes:  ATM, BRCA1, BRCA2, CDH1, CHEK2, PALB2, PTEN, STK11 and TP53.  The Common Hereditary Cancers Panel offered by [facility] includes sequencing and/or deletion duplication testing of the following 48 genes: APC, ATM, AXIN2, BARD1, BMPR1A, BRCA1, BRCA2, BRIP1, CDH1, CDKN2A (p14ARF), CDKN2A (p16INK4a), CKD4, CHEK2, CTNNA1, DICER1, EPCAM (Deletion/duplication testing only), GREM1 (promoter region deletion/duplication testing only), KIT, MEN1, MLH1, MSH2, MSH3, MSH6, MUTYH, NBN, NF1, NHTL1, PALB2, PDGFRA, PMS2, POLD1, POLE, PTEN, RAD50, RAD51C, RAD51D, RNF43, SDHB, SDHC, SDHD, SMAD4, SMARCA4. STK11, TP53, TSC1, TSC2, and VHL.  The following genes were evaluated for sequence changes only: SDHA and HOXB13 c.251G>A variant only. The test report has been scanned into EPIC and is located under the Molecular Pathology section of the Results Review tab.  A portion of the result report is included below for reference.     We discussed with Ms. Pontarelli that because current genetic testing is not perfect, it is possible there may be a gene mutation in one of these genes that current testing cannot detect, but that chance is small.  We also discussed, that there could be another gene that has not yet been discovered, or that we have not yet tested, that is responsible for the cancer diagnoses in the family. It is also possible there is a hereditary cause for the cancer in the family that Ms. Garside did not inherit and therefore was not identified in her testing.  Therefore, it is important to remain in touch with cancer genetics in the future so that we can continue to offer Ms. Larcom the most up to date genetic testing.   ADDITIONAL GENETIC TESTING: We discussed with Ms. Yeung that her genetic testing was fairly extensive.  If there are genes  identified to increase cancer risk that can be analyzed in the future, we would be happy to discuss and coordinate this testing at that time.    CANCER SCREENING RECOMMENDATIONS: Ms. Mackintosh test result is considered negative (normal).  This means that we have not identified a hereditary cause for her  personal and family history of cancer at this time. Most cancers happen by chance and this negative test suggests that her cancer may fall into this category.    While reassuring, this does not definitively rule out a hereditary predisposition to cancer. It is still possible that there could be genetic mutations that are undetectable by current technology. There could be genetic mutations in genes that have not been tested or identified to increase cancer risk.  Therefore, it is recommended she continue to follow the cancer management and screening guidelines provided by her oncology and primary healthcare provider.   An individual's cancer risk and medical management are not determined by genetic test results alone. Overall cancer risk assessment incorporates additional factors, including personal medical history, family history, and any available genetic information that may result in a personalized plan for cancer prevention and surveillance.  RECOMMENDATIONS FOR FAMILY MEMBERS:  Relatives in this family might be at some increased  risk of developing cancer, over the general population risk, simply due to the family history of cancer.  We recommended female relatives in this family have a yearly mammogram beginning at age 38, or 67 years younger than the earliest onset of cancer, an annual clinical breast exam, and perform monthly breast self-exams. Female relatives in this family should also have a gynecological exam as recommended by their primary provider. All family members should have a colonoscopy by age 35, or as directed by their physicians.   It is also possible there is a hereditary cause for  the cancer in Ms. Kraker's family that she did not inherit and therefore was not identified in her.  Based on Ms. Haq's family history, we recommended maternal relatives have genetic counseling and testing. Ms. Beadle will let us know if we can be of any assistance in coordinating genetic counseling and/or testing for these family members.  FOLLOW-UP: Lastly, we discussed with Ms. Mullenax that cancer genetics is a rapidly advancing field and it is possible that new genetic tests will be appropriate for her and/or her family members in the future. We encouraged her to remain in contact with cancer genetics on an annual basis so we can update her personal and family histories and let her know of advances in cancer genetics that may benefit this family.   Our contact number was provided. Ms. Arias questions were answered to her satisfaction, and she knows she is welcome to call us at anytime with additional questions or concerns.   Faith Rogue, MS, [facility] Genetic Counselor [email or web address] Phone: [phone]

## 2019-05-31 ENCOUNTER — Other Ambulatory Visit (HOSPITAL_COMMUNITY)
Admission: RE | Admit: 2019-05-31 | Discharge: 2019-05-31 | Disposition: A | Discharge status: Home / Self Care | Payer: 59 | Source: Ambulatory Visit | Attending: Obstetrics & Gynecology | Admitting: Obstetrics & Gynecology

## 2019-05-31 DIAGNOSIS — Z20828 Contact with and (suspected) exposure to other viral communicable diseases: Secondary | ICD-10-CM | POA: Diagnosis not present

## 2019-05-31 DIAGNOSIS — Z01812 Encounter for preprocedural laboratory examination: Secondary | ICD-10-CM | POA: Insufficient documentation

## 2019-05-31 LAB — SARS CORONAVIRUS 2 (TAT 6-24 HRS): SARS Coronavirus 2: NEGATIVE

## 2019-06-01 NOTE — H&P (Signed)
53yo PM female who presents [facility], D&C, polypectomy due to AUB  In review, she was seen in June 2018 for a similar issue and notes that the bleeding has persisted. The bleeding is very light and only a few spots of either old dark blood or notes blood-odor. Most noticeable when she wipes. Initially it was unclear if she was postmenopausal or not due to piror ablation. However, [place] elevated at 35, which is suggestive of PM and EMB is suggesstive of a polyp. Options were reviewed with patient and she wishes to proceed with surgical intervention.   She notes that she had a few episodes of spotting, most noticeable when she wipes. She cannot recall how many times since her last visit, but not often. Denies heavy bleeding. Denies pelvic or abdominal pain. No vaginal discharge, itching or irritation. No other acute complaints Last pap 03/2017- ASCUS, HPV neg. EMB: 04/2019: benign endometrioid polyp   Current Medications  Taking   Naprosyn(Naproxen) 500 MG Tablet Take 1 tablet by mouth every 12 hours as needed for pain , Notes: MAUNEY As Needed   Wellbutrin XL(buPROPion HCl ER (XL)) 150 MG Tablet Extended Release 24 Hour 1 tablet in the morning Orally Once a day   Hydrochlorothiazide 25 MG Tablet 1 tablet by mouth Once a day, Notes: rankins   Hair Skin & Nails Gummies(Biotin w/ Vitamins C & E)   Centrum MultiGummies - Tablet Chewable as directed Orally , Notes: Vita Fusion   Medication List reviewed and reconciled with the patient    Past Medical History  Migraines.   mild OSA AHI 9.3/hr now on CPAP at 6cm H2O.   HTN.   Carpel Tunnel.   Sleep apena.   breast cancer;Low grade ductal carcinoma in situ 04/2019- upcoming surgery scheduled    Surgical History  R ankle   C-sections x2 1994, 2005  Tummy Tuck 2007  Endometrial ablation 2010  colonoscopy 07/05/2017   Family History  Father: alive 42 yrs  Mother: alive 20 yrs, HTN, Kidney Disease, diagnosed with Hypertension  Brother 1: alive 87  yrs, HTN, obesity, heart disease, Hypertension, Coronary artery disease  Sister 1: alive 12 yrs, thyroid disease, heart disease, Coronary artery disease, Breast cancer  Sister 2: deceased 10 yrs, Breast cancer  2 son(s) - healthy.   Significant GI family history: None 2 sisters breast cancer.   Social History  General:  Alcohol: yes, social.  Children: 2 sons.  Caffeine: yes, 4 cups of coffee a day.  Tobacco use  cigarettes: Never smoked Tobacco history last updated 05/28/2019 Vaping No DENTAL CARE: good.  Marital Status: married.  no Recreational drug use.  OCCUPATION: Unemployed.  Exercise: yes, Cardio 3-4x a week.    Gyn History  Sexual activity currently sexually active.  Periods : None .  Denies H/O LMP.  Denies H/O Birth control.  Last pap smear date 03/2017 .  Last mammogram date 04/2019 .  Abnormal pap smear yes.  Denies H/O STD.  Other: h/ o endometrial ablation.    OB History  Number of pregnancies 2.  Pregnancy # 1 live birth, C-section delivery.  Pregnancy # 2 live birth, C-section.    Allergies  Imitrex: nauseated/feel like pass out - Lack of Therapeutic Effect  Hydrocodone Bitartrate: does not work - Lack of Therapeutic Effect   Hospitalization/Major Diagnostic Procedure  not in the past yr. 05/2017   Review of Systems  CONSTITUTIONAL:  no Fatigue. no Fever. no Weight gain. no Weight loss.  HEENT:  no  nasal congestion. no Visual changes.  CARDIOLOGY:  no Chest pain. no Dyspnea on exertion. no Edema. no Palpitations.  RESPIRATORY:  no Shortness of breath. no Cough. no Hemoptysis.  UROLOGY:  no Pain with urination. no Urinary urgency. no Urinary frequency. no Urinary incontinence.  GASTROENTEROLOGY:  no Change in bowel habits. no Constipation. no Diarrhea. no Incontinence of stool.  FEMALE REPRODUCTIVE:  See HPI for details. no Breast pain. no Breast tenderness.  NEUROLOGY:  no Dizziness. no Fainting. Headache yes.  PSYCHOLOGY:  Anxiety yes.  Depression yes.  SKIN:  no Rash. no Hives.  ENDOCRINOLOGY:  no Cold intolerance. no Excessive thirst. no Excessive urination.     Vital Signs  Wt 184, Wt change .4 lb, Ht 65.25, BMI 30.38, Temp 96.8, Pulse sitting 66, BP sitting 120/80.   Examination  General Examination: CONSTITUTIONAL: well developed, well nourished.  SKIN: warm and dry, no rashes.  NECK: supple, normal appearance.  LUNGS: regular breathing rate and effort.  HEART: no murmurs, regular rate and rhythm.  ABDOMEN: soft and not tender, no rebound, no masses palpated, no rigidity.  FEMALE GENITOURINARY: normal external genitalia, labia - unremarkable, vagina - pink moist mucosa, no lesions or abnormal discharge, cervix - no discharge or lesions or CMT, adnexa - no masses or tenderness, uterus - nontender and normal size on palpation.  MUSCULOSKELETAL no calf tenderness bilaterally.  EXTREMITIES: no edema present.  PSYCH: appropriate mood and affect.     A/P: 53yo female who presents for hysteroscopy, D&C, possible polypectomy due to AUB -NPO -LR @ 125cc/hr -SCDs to OR -Risk/benefit and alternatives reviewed with patient including risk/benefit and alternatives including risk of infection, bleeding and potential injury due to uterine perforation  Janyth Pupa, DO [phone] (cell) [phone] (office)

## 2019-06-03 ENCOUNTER — Other Ambulatory Visit: Payer: Self-pay

## 2019-06-03 ENCOUNTER — Encounter (HOSPITAL_BASED_OUTPATIENT_CLINIC_OR_DEPARTMENT_OTHER): Payer: Self-pay | Admitting: *Deleted

## 2019-06-04 ENCOUNTER — Ambulatory Visit (HOSPITAL_BASED_OUTPATIENT_CLINIC_OR_DEPARTMENT_OTHER): Payer: No Typology Code available for payment source | Admitting: Certified Registered"

## 2019-06-04 ENCOUNTER — Ambulatory Visit (HOSPITAL_BASED_OUTPATIENT_CLINIC_OR_DEPARTMENT_OTHER)
Admission: RE | Admit: 2019-06-04 | Discharge: 2019-06-04 | Disposition: A | Discharge status: Home / Self Care | Payer: No Typology Code available for payment source | Attending: Obstetrics & Gynecology | Admitting: Obstetrics & Gynecology

## 2019-06-04 ENCOUNTER — Encounter (HOSPITAL_BASED_OUTPATIENT_CLINIC_OR_DEPARTMENT_OTHER): Payer: Self-pay

## 2019-06-04 ENCOUNTER — Encounter (HOSPITAL_BASED_OUTPATIENT_CLINIC_OR_DEPARTMENT_OTHER)
Admission: RE | Disposition: A | Discharge status: Home / Self Care | Payer: Self-pay | Source: Home / Self Care | Attending: Obstetrics & Gynecology

## 2019-06-04 ENCOUNTER — Other Ambulatory Visit: Payer: Self-pay

## 2019-06-04 DIAGNOSIS — Z01812 Encounter for preprocedural laboratory examination: Secondary | ICD-10-CM | POA: Insufficient documentation

## 2019-06-04 DIAGNOSIS — N840 Polyp of corpus uteri: Secondary | ICD-10-CM | POA: Diagnosis not present

## 2019-06-04 DIAGNOSIS — N854 Malposition of uterus: Secondary | ICD-10-CM | POA: Insufficient documentation

## 2019-06-04 DIAGNOSIS — D0510 Intraductal carcinoma in situ of unspecified breast: Secondary | ICD-10-CM | POA: Insufficient documentation

## 2019-06-04 DIAGNOSIS — F329 Major depressive disorder, single episode, unspecified: Secondary | ICD-10-CM | POA: Insufficient documentation

## 2019-06-04 DIAGNOSIS — I10 Essential (primary) hypertension: Secondary | ICD-10-CM | POA: Insufficient documentation

## 2019-06-04 DIAGNOSIS — G4733 Obstructive sleep apnea (adult) (pediatric): Secondary | ICD-10-CM | POA: Insufficient documentation

## 2019-06-04 DIAGNOSIS — N939 Abnormal uterine and vaginal bleeding, unspecified: Secondary | ICD-10-CM | POA: Insufficient documentation

## 2019-06-04 DIAGNOSIS — Z6830 Body mass index (BMI) 30.0-30.9, adult: Secondary | ICD-10-CM | POA: Insufficient documentation

## 2019-06-04 DIAGNOSIS — Z01810 Encounter for preprocedural cardiovascular examination: Secondary | ICD-10-CM | POA: Insufficient documentation

## 2019-06-04 HISTORY — PX: LAPAROSCOPY: SHX197

## 2019-06-04 HISTORY — PX: DILATATION & CURETTAGE/HYSTEROSCOPY WITH MYOSURE: SHX6511

## 2019-06-04 HISTORY — DX: Abnormal uterine and vaginal bleeding, unspecified: N93.9

## 2019-06-04 SURGERY — DILATATION & CURETTAGE/HYSTEROSCOPY WITH MYOSURE
Anesthesia: General | Site: Vagina

## 2019-06-04 MED ORDER — SODIUM CHLORIDE 0.9 % IV SOLN
INTRAVENOUS | Status: DC | PRN
  Administered 2019-06-04: 50 ug/min via INTRAVENOUS

## 2019-06-04 MED ORDER — ACETAMINOPHEN 160 MG/5ML PO SOLN: 325.0000 mg | ORAL | Status: DC | PRN

## 2019-06-04 MED ORDER — FENTANYL CITRATE (PF) 100 MCG/2ML IJ SOLN
25.0000 ug | INTRAMUSCULAR | Status: DC | PRN
  Administered 2019-06-04: 50 ug via INTRAVENOUS
  Administered 2019-06-04 (×2): 25 ug via INTRAVENOUS
  Administered 2019-06-04: 50 ug via INTRAVENOUS

## 2019-06-04 MED ORDER — LIDOCAINE-EPINEPHRINE 1 %-1:100000 IJ SOLN
INTRAMUSCULAR | Status: DC | PRN
  Administered 2019-06-04: 20 mL

## 2019-06-04 MED ORDER — MEPERIDINE HCL 25 MG/ML IJ SOLN: 6.2500 mg | INTRAMUSCULAR | Status: DC | PRN

## 2019-06-04 MED ORDER — SODIUM CHLORIDE 0.9 % IR SOLN
Status: DC | PRN
  Administered 2019-06-04: 3000 mL

## 2019-06-04 MED ORDER — IBUPROFEN 600 MG PO TABS: 600.0000 mg | ORAL_TABLET | Freq: Four times a day (QID) | ORAL | 0 refills | Status: DC | PRN

## 2019-06-04 MED ORDER — EPHEDRINE SULFATE 50 MG/ML IJ SOLN
INTRAMUSCULAR | Status: DC | PRN
  Administered 2019-06-04: 5 mg via INTRAVENOUS

## 2019-06-04 MED ORDER — ACETAMINOPHEN 325 MG PO TABS: 325.0000 mg | ORAL_TABLET | ORAL | Status: DC | PRN

## 2019-06-04 MED ORDER — DOCUSATE SODIUM 100 MG PO CAPS: 100.0000 mg | ORAL_CAPSULE | Freq: Two times a day (BID) | ORAL | 0 refills | Status: DC | PRN

## 2019-06-04 MED ORDER — ONDANSETRON HCL 4 MG/2ML IJ SOLN
4.0000 mg | Freq: Once | INTRAMUSCULAR | Status: AC | PRN
  Administered 2019-06-04: 4 mg via INTRAVENOUS

## 2019-06-04 MED ORDER — LIDOCAINE HCL 1 % IJ SOLN
INTRAMUSCULAR | Status: DC | PRN
  Administered 2019-06-04: 20 mL

## 2019-06-04 MED ORDER — LACTATED RINGERS IV SOLN: INTRAVENOUS | Status: DC

## 2019-06-04 MED ORDER — LIDOCAINE HCL (CARDIAC) PF 100 MG/5ML IV SOSY
PREFILLED_SYRINGE | INTRAVENOUS | Status: DC | PRN
  Administered 2019-06-04: 80 mg via INTRAVENOUS

## 2019-06-04 MED ORDER — PROPOFOL 10 MG/ML IV BOLUS
INTRAVENOUS | Status: DC | PRN
  Administered 2019-06-04: 200 mg via INTRAVENOUS

## 2019-06-04 MED ORDER — FENTANYL CITRATE (PF) 100 MCG/2ML IJ SOLN
INTRAMUSCULAR | Status: AC
  Filled 2019-06-04: qty 2

## 2019-06-04 MED ORDER — MIDAZOLAM HCL 2 MG/2ML IJ SOLN
1.0000 mg | INTRAMUSCULAR | Status: DC | PRN
  Administered 2019-06-04: 2 mg via INTRAVENOUS

## 2019-06-04 MED ORDER — OXYCODONE HCL 5 MG/5ML PO SOLN: 5.0000 mg | Freq: Once | ORAL | Status: DC | PRN

## 2019-06-04 MED ORDER — FENTANYL CITRATE (PF) 100 MCG/2ML IJ SOLN: 50.0000 ug | INTRAMUSCULAR | Status: DC | PRN

## 2019-06-04 MED ORDER — OXYCODONE HCL 5 MG PO TABS: 5.0000 mg | ORAL_TABLET | Freq: Once | ORAL | Status: DC | PRN

## 2019-06-04 MED ORDER — DEXAMETHASONE SODIUM PHOSPHATE 4 MG/ML IJ SOLN
INTRAMUSCULAR | Status: DC | PRN
  Administered 2019-06-04: 4 mg via INTRAVENOUS

## 2019-06-04 MED ORDER — HEMOSTATIC AGENTS (NO CHARGE) OPTIME
TOPICAL | Status: DC | PRN
  Administered 2019-06-04: 1 via TOPICAL

## 2019-06-04 MED ORDER — PHENYLEPHRINE HCL (PRESSORS) 10 MG/ML IV SOLN
INTRAVENOUS | Status: DC | PRN
  Administered 2019-06-04 (×4): 40 ug via INTRAVENOUS

## 2019-06-04 MED ORDER — PHENYLEPHRINE 40 MCG/ML (10ML) SYRINGE FOR IV PUSH (FOR BLOOD PRESSURE SUPPORT)
PREFILLED_SYRINGE | INTRAVENOUS | Status: AC
  Filled 2019-06-04: qty 10

## 2019-06-04 MED ORDER — LACTATED RINGERS IV SOLN
INTRAVENOUS | Status: DC
  Administered 2019-06-04: 15:00:00 10 mL/h via INTRAVENOUS
  Administered 2019-06-04: 13:00:00 via INTRAVENOUS

## 2019-06-04 MED ORDER — SCOPOLAMINE 1 MG/3DAYS TD PT72: 1.0000 | MEDICATED_PATCH | Freq: Once | TRANSDERMAL | Status: DC

## 2019-06-04 MED ORDER — OXYCODONE-ACETAMINOPHEN 5-325 MG PO TABS: 1.0000 | ORAL_TABLET | Freq: Four times a day (QID) | ORAL | 0 refills | Status: AC | PRN

## 2019-06-04 MED ORDER — MIDAZOLAM HCL 2 MG/2ML IJ SOLN
INTRAMUSCULAR | Status: AC
  Filled 2019-06-04: qty 2

## 2019-06-04 MED ORDER — DEXAMETHASONE SODIUM PHOSPHATE 10 MG/ML IJ SOLN
INTRAMUSCULAR | Status: AC
  Filled 2019-06-04: qty 1

## 2019-06-04 SURGICAL SUPPLY — 35 items
APL SRG 38 LTWT LNG FL B (MISCELLANEOUS) ×2
APL SWBSTK 6 STRL LF DISP (MISCELLANEOUS) ×2
APPLICATOR ARISTA FLEXITIP XL (MISCELLANEOUS) ×3 IMPLANT
APPLICATOR COTTON TIP 6 STRL (MISCELLANEOUS) IMPLANT
APPLICATOR COTTON TIP 6IN STRL (MISCELLANEOUS) ×4
BRIEF STRETCH FOR OB PAD XXL (UNDERPADS AND DIAPERS) ×4 IMPLANT
CANISTER SUCT 3000ML PPV (MISCELLANEOUS) ×4 IMPLANT
CATH ROBINSON RED A/P 16FR (CATHETERS) ×4 IMPLANT
CONT SPEC 4OZ CLIKSEAL STRL BL (MISCELLANEOUS) ×6 IMPLANT
COVER SURGICAL LIGHT HANDLE (MISCELLANEOUS) ×3 IMPLANT
DEVICE MYOSURE LITE (MISCELLANEOUS) IMPLANT
DEVICE MYOSURE REACH (MISCELLANEOUS) IMPLANT
DILATOR CANAL MILEX (MISCELLANEOUS) IMPLANT
DRAPE LAPAROSCOPIC ABDOMINAL (DRAPES) ×2 IMPLANT
DURAPREP 26ML APPLICATOR (WOUND CARE) ×2 IMPLANT
GAUZE 4X4 16PLY RFD (DISPOSABLE) ×4 IMPLANT
GLOVE BIOGEL M 6.5 STRL (GLOVE) ×3 IMPLANT
GLOVE BIOGEL PI IND STRL 6.5 (GLOVE) ×2 IMPLANT
GLOVE BIOGEL PI IND STRL 7.0 (GLOVE) ×2 IMPLANT
GLOVE BIOGEL PI INDICATOR 6.5 (GLOVE) ×2
GLOVE BIOGEL PI INDICATOR 7.0 (GLOVE) ×2
GLOVE ECLIPSE 6.5 STRL STRAW (GLOVE) ×4 IMPLANT
GOWN STRL REUS W/TWL LRG LVL3 (GOWN DISPOSABLE) ×8 IMPLANT
HEMOSTAT ARISTA ABSORB 3G PWDR (HEMOSTASIS) ×2 IMPLANT
KIT PROCEDURE FLUENT (KITS) ×4 IMPLANT
MARKER SKIN DUAL TIP RULER LAB (MISCELLANEOUS) ×2 IMPLANT
NEEDLE INSUFFLATION 120MM (ENDOMECHANICALS) ×2 IMPLANT
PACK VAGINAL MINOR WOMEN LF (CUSTOM PROCEDURE TRAY) ×4 IMPLANT
PAD OB MATERNITY 4.3X12.25 (PERSONAL CARE ITEMS) ×4 IMPLANT
PAD PREP 24X48 CUFFED NSTRL (MISCELLANEOUS) ×4 IMPLANT
SEAL ROD LENS SCOPE MYOSURE (ABLATOR) ×4 IMPLANT
SET IRRIG TUBING LAPAROSCOPIC (IRRIGATION / IRRIGATOR) ×4 IMPLANT
SLEEVE SCD COMPRESS KNEE MED (MISCELLANEOUS) ×5 IMPLANT
TOWEL GREEN STERILE FF (TOWEL DISPOSABLE) ×8 IMPLANT
TROCAR XCEL NON-BLD 5MMX100MML (ENDOMECHANICALS) ×6 IMPLANT

## 2019-06-04 NOTE — Op Note (Signed)
Operative Report  PreOp: 1) Endometrial polyp PostOp: same and uterine perforation Procedure:  Hysteroscopy, Diagnostic laparoscopy, repair of uterine perforation Surgeon: Dr. Janyth Pupa Anesthesia: General Complications:uterine perforation EBL: 20cc UOP: 50cc IVF:1000cc  Findings: 7cm anteverted uterus, unable to visualize endometrial cavity due to prior scar tissue from ablation.  On laparoscopic findings- small ~5mm perforation and the fundal portion of uterus.  Normal tubes and ovaries bilaterally. Grossly normal appearing bowel, liver and gallbladder  Specimens: none  Procedure: The patient was taken to the operating room where she underwent general anesthesia without difficulty. The patient was placed in a low lithotomy position using Allen stirrups. The patient was examined with the findings as noted above.  She was then prepped and draped in the normal sterile fashion. The bladder was drained using a red rubber urethral catheter. A sterile speculum was inserted into the vagina. 20cc of xylocaine with epi was used for a cervical block.  A single tooth tenaculum was placed on the anterior lip of the cervix. The uterus was then sounded to 7cm. The endocervical canal was then serially dilated to 14French using Hank dilators.  The diagnostic hysteroscope was then; however, narrowing of the endometrial canal was noted and uterine perforation occurred.  Deficit was noted to be 200cc.  Hysteroscope was removed.  Procedure was converted to laparoscopy for exploration due to concern for uterine perforation.  The umbilical area was injected with 10cc of 1% lidocaine.  The veress needle was carefully introduced into the peritoneal cavity while tenting the abdominal wall. Intraperitoneal placement was confirmed by use of a saline-drop test.  The gas was connected and confirmed intrabdominal placement by a low initial pressure of 59mmHg. The abdomen was then insuflated with CO2 gas. The trocar and sleeve  were then advanced without difficulty into the abdomen under direct visualization. Intraabdominal placement was confirmed by the laparoscope and surveillance of the abdomen was performed. Grossly normal appearing abdomen.  Uterus was examined with the findings as mentioned- small perforation noted at fundus with moderate bleeding.  An additional left lower quadrant 70mm port was placed under direct visualization. The Kleppinger was used to obtain adequate hemostasis.  Irrigation was completed.  Arista was placed and hemostasis noted. The ports were removed under direct visualization.  The patient was repositioned to the supine position. The patient tolerated the procedure without any complications and taken to recovery in stable condition.   Janyth Pupa, DO [phone] (pager) [phone] (office)

## 2019-06-04 NOTE — Interval H&P Note (Signed)
History and Physical Interval Note:  06/04/2019 1:31 PM  Kara [name]  has presented today for surgery, with the diagnosis of N93.9 Abnormal uterine bleeding N84.0 uterine polyp Z98.890 history of endometrial ablation.  The various methods of treatment have been discussed with the patient and family. After consideration of risks, benefits and other options for treatment, the patient has consented to  Procedure(s): [place] (N/A) as a surgical intervention.  The patient's history has been reviewed, patient examined, no change in status, stable for surgery.  I have reviewed the patient's chart and labs.  Questions were answered to the patient's satisfaction.     Annalee Genta

## 2019-06-04 NOTE — Discharge Instructions (Addendum)
°  Post Anesthesia Home Care Instructions  Activity: Get plenty of rest for the remainder of the day. A responsible individual must stay with you for 24 hours following the procedure.  For the next 24 hours, DO NOT: -Drive a car -Paediatric nurse -Drink alcoholic beverages -Take any medication unless instructed by your physician -Make any legal decisions or sign important papers.  Meals: Start with liquid foods such as gelatin or soup. Progress to regular foods as tolerated. Avoid greasy, spicy, heavy foods. If nausea and/or vomiting occur, drink only clear liquids until the nausea and/or vomiting subsides. Call your physician if vomiting continues.  Special Instructions/Symptoms: Your throat may feel dry or sore from the anesthesia or the breathing tube placed in your throat during surgery. If this causes discomfort, gargle with warm salt water. The discomfort should disappear within 24 hours.  If you had a scopolamine patch placed behind your ear for the management of post- operative nausea and/or vomiting:  1. The medication in the patch is effective for 72 hours, after which it should be removed.  Wrap patch in a tissue and discard in the trash. Wash hands thoroughly with soap and water. 2. You may remove the patch earlier than 72 hours if you experience unpleasant side effects which may include dry mouth, dizziness or visual disturbances. 3. Avoid touching the patch. Wash your hands with soap and water after contact with the patch.      HOME INSTRUCTIONS  Please note any unusual or excessive bleeding, pain, swelling. Mild dizziness or drowsiness are normal for about 24 hours after surgery.   Shower when comfortable  Restrictions: No driving for 24 hours or while taking pain medications.  Activity:  No heavy lifting (> 10 lbs), nothing in vagina (no tampons, douching, or intercourse) x 2 weeks; no tub baths for 2 weeks Vaginal spotting is expected but if your bleeding is heavy,  period like,  please call the office   Incision: the bandaids will fall off when they are ready to; you may clean your incision with mild soap and water but do not rub or scrub the incision site.  You may experience slight bloody drainage from your incision periodically.  This is normal.  If you experience a large amount of drainage or the incision opens, please call your physician who will likely direct you to the emergency department.  Diet:  You may return to your regular diet.  Do not eat large meals.  Eat small frequent meals throughout the day.  Continue to drink a good amount of water at least 6-8 glasses of water per day, hydration is very important for the healing process.  Pain Management: Take Motrin and/or Percocet as prescribed/needed for pain.  Always take prescription pain medication with food, it may cause constipation, increase fluids and fiber and you may want to take an over-the-counter stool softener like Colace as needed up to 2x a day.    Alcohol -- Avoid for 24 hours and while taking pain medications.  Nausea: Take sips of ginger ale or soda  Fever -- Call physician if temperature over 101 degrees  Follow up:  If you do not already have a follow up appointment scheduled, please call the office at [phone].  If you experience fever (a temperature greater than 100.4), pain unrelieved by pain medication, shortness of breath, swelling of a single leg, or any other symptoms which are concerning to you please the office immediately.

## 2019-06-04 NOTE — Transfer of Care (Signed)
Immediate Anesthesia Transfer of Care Note  Patient: Kara [name]  Procedure(s) Performed: DILATATION /HYSTEROSCOPY (N/A Vagina ) LAPAROSCOPY Diagnostic, Repair Uteine Perforation (N/A Abdomen)  Patient Location: PACU  Anesthesia Type:General  Level of Consciousness: sedated and responds to stimulation  Airway & Oxygen Therapy: Patient Spontanous Breathing and Patient connected to face mask oxygen  Post-op Assessment: Report given to RN and Post -op Vital signs reviewed and stable  Post vital signs: Reviewed and stable  Last Vitals:  Vitals Value Taken Time  BP 112/63 06/04/19 1507  Temp    Pulse 66 06/04/19 1509  Resp 16 06/04/19 1509  SpO2 100 % 06/04/19 1509  Vitals shown include unvalidated device data.  Last Pain:  Vitals:   06/04/19 1243  TempSrc: Oral  PainSc: 0-No pain         Complications: No apparent anesthesia complications

## 2019-06-04 NOTE — Anesthesia Procedure Notes (Signed)
Procedure Name: LMA Insertion Date/Time: 06/04/2019 1:47 PM Performed by: Lavonia Dana, CRNA Pre-anesthesia Checklist: Patient identified, Emergency Drugs available, Suction available and Patient being monitored Patient Re-evaluated:Patient Re-evaluated prior to induction Oxygen Delivery Method: Circle system utilized Preoxygenation: Pre-oxygenation with 100% oxygen Induction Type: IV induction Ventilation: Mask ventilation without difficulty LMA: LMA inserted LMA Size: 4.0 Number of attempts: 1 Airway Equipment and Method: Bite block Placement Confirmation: positive ETCO2 Tube secured with: Tape Dental Injury: Teeth and Oropharynx as per pre-operative assessment

## 2019-06-04 NOTE — Anesthesia Preprocedure Evaluation (Signed)
Anesthesia Evaluation  Patient identified by MRN, date of birth, ID band Patient awake    Reviewed: Allergy & Precautions, H&P , NPO status , Patient's Chart, lab work & pertinent test results, reviewed documented beta blocker date and time   Airway Mallampati: II  TM Distance: >3 FB Neck ROM: full    Dental no notable dental hx.    Pulmonary sleep apnea and Continuous Positive Airway Pressure Ventilation ,    Pulmonary exam normal breath sounds clear to auscultation       Cardiovascular Exercise Tolerance: Good hypertension, Pt. on medications negative cardio ROS   Rhythm:regular Rate:Normal     Neuro/Psych  Headaches, PSYCHIATRIC DISORDERS Depression    GI/Hepatic negative GI ROS, Neg liver ROS,   Endo/Other  Morbid obesity  Renal/GU negative Renal ROS  negative genitourinary   Musculoskeletal   Abdominal   Peds  Hematology  (+) Blood dyscrasia, anemia ,   Anesthesia Other Findings   Reproductive/Obstetrics negative OB ROS                             Anesthesia Physical Anesthesia Plan  ASA: III  Anesthesia Plan: General   Post-op Pain Management:    Induction: Intravenous  PONV Risk Score and Plan: 3 and Ondansetron, Dexamethasone, Treatment may vary due to age or medical condition and Midazolam  Airway Management Planned: LMA and Oral ETT  Additional Equipment:   Intra-op Plan:   Post-operative Plan:   Informed Consent: I have reviewed the patients History and Physical, chart, labs and discussed the procedure including the risks, benefits and alternatives for the proposed anesthesia with the patient or authorized representative who has indicated his/her understanding and acceptance.     Dental Advisory Given  Plan Discussed with: CRNA, Anesthesiologist and Surgeon  Anesthesia Plan Comments: ( )        Anesthesia Quick Evaluation

## 2019-06-05 ENCOUNTER — Encounter (HOSPITAL_BASED_OUTPATIENT_CLINIC_OR_DEPARTMENT_OTHER): Payer: Self-pay | Admitting: Obstetrics & Gynecology

## 2019-06-05 NOTE — Anesthesia Postprocedure Evaluation (Signed)
Anesthesia Post Note  Patient: Tanaisha Pittman  Procedure(s) Performed: DILATATION /HYSTEROSCOPY (N/A Vagina ) LAPAROSCOPY Diagnostic, Repair Uterine Perforation (N/A Abdomen)     Patient location during evaluation: PACU Anesthesia Type: General Level of consciousness: awake and alert Pain management: pain level controlled Vital Signs Assessment: post-procedure vital signs reviewed and stable Respiratory status: spontaneous breathing, nonlabored ventilation, respiratory function stable and patient connected to nasal cannula oxygen Cardiovascular status: blood pressure returned to baseline and stable Postop Assessment: no apparent nausea or vomiting Anesthetic complications: no    Last Vitals:  Vitals:   06/04/19 1630 06/04/19 1659  BP: 134/75 116/67  Pulse: 60 (!) 56  Resp: 16 16  Temp:  36.6 C  SpO2: 100% 98%    Last Pain:  Vitals:   06/04/19 1659  TempSrc: Oral  PainSc: 1                  Aishah Teffeteller

## 2019-06-06 ENCOUNTER — Other Ambulatory Visit (HOSPITAL_COMMUNITY): Payer: Self-pay

## 2019-06-06 ENCOUNTER — Other Ambulatory Visit (HOSPITAL_COMMUNITY)
Admission: RE | Admit: 2019-06-06 | Discharge: 2019-06-06 | Disposition: A | Discharge status: Home / Self Care | Payer: 59 | Source: Ambulatory Visit | Attending: Surgery | Admitting: Surgery

## 2019-06-06 DIAGNOSIS — Z01812 Encounter for preprocedural laboratory examination: Secondary | ICD-10-CM | POA: Insufficient documentation

## 2019-06-06 DIAGNOSIS — Z20828 Contact with and (suspected) exposure to other viral communicable diseases: Secondary | ICD-10-CM | POA: Diagnosis not present

## 2019-06-06 LAB — SARS CORONAVIRUS 2 (TAT 6-24 HRS): SARS Coronavirus 2: NEGATIVE

## 2019-06-09 ENCOUNTER — Ambulatory Visit
Admission: RE | Admit: 2019-06-09 | Discharge: 2019-06-09 | Disposition: A | Discharge status: Home / Self Care | Payer: 59 | Source: Ambulatory Visit | Attending: Surgery | Admitting: Surgery

## 2019-06-09 ENCOUNTER — Other Ambulatory Visit: Payer: Self-pay | Admitting: Surgery

## 2019-06-09 ENCOUNTER — Encounter (HOSPITAL_COMMUNITY): Payer: Self-pay | Admitting: Certified Registered"

## 2019-06-09 ENCOUNTER — Other Ambulatory Visit: Payer: Self-pay

## 2019-06-09 DIAGNOSIS — D0512 Intraductal carcinoma in situ of left breast: Secondary | ICD-10-CM

## 2019-06-10 ENCOUNTER — Encounter: Payer: Self-pay | Admitting: *Deleted

## 2019-06-10 ENCOUNTER — Other Ambulatory Visit: Payer: Self-pay | Admitting: Surgery

## 2019-06-10 ENCOUNTER — Ambulatory Visit (HOSPITAL_BASED_OUTPATIENT_CLINIC_OR_DEPARTMENT_OTHER): Admission: RE | Admit: 2019-06-10 | Payer: 59 | Source: Home / Self Care | Admitting: Surgery

## 2019-06-10 DIAGNOSIS — C50312 Malignant neoplasm of lower-inner quadrant of left female breast: Secondary | ICD-10-CM

## 2019-06-10 DIAGNOSIS — D0512 Intraductal carcinoma in situ of left breast: Secondary | ICD-10-CM

## 2019-06-10 SURGERY — BREAST LUMPECTOMY WITH RADIOACTIVE SEED LOCALIZATION
Anesthesia: General | Site: Breast | Laterality: Left

## 2019-06-13 ENCOUNTER — Other Ambulatory Visit (HOSPITAL_COMMUNITY): Payer: 59

## 2019-06-13 ENCOUNTER — Telehealth: Payer: Self-pay | Admitting: Adult Health

## 2019-06-13 NOTE — Telephone Encounter (Signed)
R/s appt per 8/27 sch message - unable to reach pt  - left message with appt date and time

## 2019-06-30 ENCOUNTER — Ambulatory Visit: Payer: 59 | Admitting: Adult Health

## 2019-07-01 ENCOUNTER — Other Ambulatory Visit: Payer: Self-pay

## 2019-07-01 ENCOUNTER — Encounter (HOSPITAL_BASED_OUTPATIENT_CLINIC_OR_DEPARTMENT_OTHER): Payer: Self-pay | Admitting: *Deleted

## 2019-07-04 ENCOUNTER — Other Ambulatory Visit (HOSPITAL_COMMUNITY)
Admission: RE | Admit: 2019-07-04 | Discharge: 2019-07-04 | Disposition: A | Discharge status: Home / Self Care | Payer: 59 | Source: Ambulatory Visit | Attending: Surgery | Admitting: Surgery

## 2019-07-04 ENCOUNTER — Other Ambulatory Visit: Payer: Self-pay

## 2019-07-04 ENCOUNTER — Encounter (HOSPITAL_BASED_OUTPATIENT_CLINIC_OR_DEPARTMENT_OTHER)
Admission: RE | Admit: 2019-07-04 | Discharge: 2019-07-04 | Disposition: A | Discharge status: Home / Self Care | Payer: 59 | Source: Ambulatory Visit | Attending: Surgery | Admitting: Surgery

## 2019-07-04 DIAGNOSIS — Z20828 Contact with and (suspected) exposure to other viral communicable diseases: Secondary | ICD-10-CM | POA: Insufficient documentation

## 2019-07-04 DIAGNOSIS — Z01812 Encounter for preprocedural laboratory examination: Secondary | ICD-10-CM | POA: Insufficient documentation

## 2019-07-04 DIAGNOSIS — R920 Mammographic microcalcification found on diagnostic imaging of breast: Secondary | ICD-10-CM | POA: Insufficient documentation

## 2019-07-04 DIAGNOSIS — Z803 Family history of malignant neoplasm of breast: Secondary | ICD-10-CM | POA: Insufficient documentation

## 2019-07-04 LAB — BASIC METABOLIC PANEL
Anion gap: 9 (ref 5–15)
BUN: 12 mg/dL (ref 6–20)
CO2: 27 mmol/L (ref 22–32)
Calcium: 9.4 mg/dL (ref 8.9–10.3)
Chloride: 100 mmol/L (ref 98–111)
Creatinine, Ser: 0.94 mg/dL (ref 0.44–1.00)
GFR calc Af Amer: >60 mL/min (ref >60)
GFR calc non Af Amer: >60 mL/min (ref >60)
Glucose, Bld: 93 mg/dL (ref 70–99)
Potassium: 3.7 mmol/L (ref 3.5–5.1)
Sodium: 136 mmol/L (ref 135–145)

## 2019-07-04 LAB — SARS CORONAVIRUS 2 (TAT 6-24 HRS): SARS Coronavirus 2: NEGATIVE

## 2019-07-04 NOTE — Progress Notes (Signed)

## 2019-07-07 ENCOUNTER — Ambulatory Visit
Admission: RE | Admit: 2019-07-07 | Discharge: 2019-07-07 | Disposition: A | Discharge status: Home / Self Care | Payer: 59 | Source: Ambulatory Visit | Attending: Surgery | Admitting: Surgery

## 2019-07-07 ENCOUNTER — Other Ambulatory Visit: Payer: Self-pay

## 2019-07-07 DIAGNOSIS — D0512 Intraductal carcinoma in situ of left breast: Secondary | ICD-10-CM

## 2019-07-08 ENCOUNTER — Ambulatory Visit (HOSPITAL_BASED_OUTPATIENT_CLINIC_OR_DEPARTMENT_OTHER): Payer: No Typology Code available for payment source | Admitting: Anesthesiology

## 2019-07-08 ENCOUNTER — Encounter (HOSPITAL_BASED_OUTPATIENT_CLINIC_OR_DEPARTMENT_OTHER): Payer: Self-pay | Admitting: Anesthesiology

## 2019-07-08 ENCOUNTER — Ambulatory Visit (HOSPITAL_BASED_OUTPATIENT_CLINIC_OR_DEPARTMENT_OTHER)
Admission: RE | Admit: 2019-07-08 | Discharge: 2019-07-08 | Disposition: A | Discharge status: Home / Self Care | Payer: No Typology Code available for payment source | Attending: Surgery | Admitting: Surgery

## 2019-07-08 ENCOUNTER — Ambulatory Visit
Admission: RE | Admit: 2019-07-08 | Discharge: 2019-07-08 | Disposition: A | Discharge status: Home / Self Care | Payer: 59 | Source: Ambulatory Visit | Attending: Surgery | Admitting: Surgery

## 2019-07-08 ENCOUNTER — Encounter (HOSPITAL_BASED_OUTPATIENT_CLINIC_OR_DEPARTMENT_OTHER)
Admission: RE | Disposition: A | Discharge status: Home / Self Care | Payer: Self-pay | Source: Home / Self Care | Attending: Surgery

## 2019-07-08 ENCOUNTER — Other Ambulatory Visit: Payer: Self-pay

## 2019-07-08 DIAGNOSIS — I10 Essential (primary) hypertension: Secondary | ICD-10-CM | POA: Diagnosis not present

## 2019-07-08 DIAGNOSIS — N6489 Other specified disorders of breast: Secondary | ICD-10-CM | POA: Insufficient documentation

## 2019-07-08 DIAGNOSIS — G43909 Migraine, unspecified, not intractable, without status migrainosus: Secondary | ICD-10-CM | POA: Insufficient documentation

## 2019-07-08 DIAGNOSIS — G4730 Sleep apnea, unspecified: Secondary | ICD-10-CM | POA: Insufficient documentation

## 2019-07-08 DIAGNOSIS — Z79899 Other long term (current) drug therapy: Secondary | ICD-10-CM | POA: Insufficient documentation

## 2019-07-08 DIAGNOSIS — Z888 Allergy status to other drugs, medicaments and biological substances status: Secondary | ICD-10-CM | POA: Insufficient documentation

## 2019-07-08 DIAGNOSIS — D0512 Intraductal carcinoma in situ of left breast: Secondary | ICD-10-CM

## 2019-07-08 DIAGNOSIS — Z803 Family history of malignant neoplasm of breast: Secondary | ICD-10-CM | POA: Diagnosis not present

## 2019-07-08 HISTORY — PX: BREAST LUMPECTOMY WITH RADIOACTIVE SEED LOCALIZATION: SHX6424

## 2019-07-08 HISTORY — PX: BREAST LUMPECTOMY: SHX2

## 2019-07-08 SURGERY — BREAST LUMPECTOMY WITH RADIOACTIVE SEED LOCALIZATION
Anesthesia: General | Site: Breast | Laterality: Left

## 2019-07-08 MED ORDER — LIDOCAINE 2% (20 MG/ML) 5 ML SYRINGE
INTRAMUSCULAR | Status: AC
  Filled 2019-07-08: qty 5

## 2019-07-08 MED ORDER — CEFAZOLIN SODIUM-DEXTROSE 2-4 GM/100ML-% IV SOLN
INTRAVENOUS | Status: AC
  Filled 2019-07-08: qty 100

## 2019-07-08 MED ORDER — ONDANSETRON HCL 4 MG/2ML IJ SOLN
INTRAMUSCULAR | Status: AC
  Filled 2019-07-08: qty 2

## 2019-07-08 MED ORDER — FENTANYL CITRATE (PF) 100 MCG/2ML IJ SOLN
50.0000 ug | INTRAMUSCULAR | Status: AC | PRN
  Administered 2019-07-08: 50 ug via INTRAVENOUS
  Administered 2019-07-08 (×2): 25 ug via INTRAVENOUS

## 2019-07-08 MED ORDER — FENTANYL CITRATE (PF) 100 MCG/2ML IJ SOLN: 25.0000 ug | INTRAMUSCULAR | Status: DC | PRN

## 2019-07-08 MED ORDER — ACETAMINOPHEN 500 MG PO TABS
ORAL_TABLET | ORAL | Status: AC
  Filled 2019-07-08: qty 2

## 2019-07-08 MED ORDER — ACETAMINOPHEN 500 MG PO TABS
1000.0000 mg | ORAL_TABLET | ORAL | Status: AC
  Administered 2019-07-08: 1000 mg via ORAL

## 2019-07-08 MED ORDER — SODIUM CHLORIDE (PF) 0.9 % IJ SOLN
INTRAMUSCULAR | Status: AC
  Filled 2019-07-08: qty 10

## 2019-07-08 MED ORDER — CELECOXIB 200 MG PO CAPS
200.0000 mg | ORAL_CAPSULE | ORAL | Status: AC
  Administered 2019-07-08: 09:00:00 200 mg via ORAL

## 2019-07-08 MED ORDER — MIDAZOLAM HCL 2 MG/2ML IJ SOLN
INTRAMUSCULAR | Status: AC
  Filled 2019-07-08: qty 2

## 2019-07-08 MED ORDER — LIDOCAINE HCL (CARDIAC) PF 100 MG/5ML IV SOSY
PREFILLED_SYRINGE | INTRAVENOUS | Status: DC | PRN
  Administered 2019-07-08: 60 mg via INTRAVENOUS

## 2019-07-08 MED ORDER — PROPOFOL 10 MG/ML IV BOLUS
INTRAVENOUS | Status: DC | PRN
  Administered 2019-07-08: 200 mg via INTRAVENOUS

## 2019-07-08 MED ORDER — GABAPENTIN 300 MG PO CAPS
ORAL_CAPSULE | ORAL | Status: AC
  Filled 2019-07-08: qty 1

## 2019-07-08 MED ORDER — BUPIVACAINE-EPINEPHRINE (PF) 0.5% -1:200000 IJ SOLN
INTRAMUSCULAR | Status: AC
  Filled 2019-07-08: qty 150

## 2019-07-08 MED ORDER — ONDANSETRON HCL 4 MG/2ML IJ SOLN
INTRAMUSCULAR | Status: DC | PRN
  Administered 2019-07-08: 4 mg via INTRAVENOUS

## 2019-07-08 MED ORDER — GABAPENTIN 300 MG PO CAPS
300.0000 mg | ORAL_CAPSULE | ORAL | Status: AC
  Administered 2019-07-08: 300 mg via ORAL

## 2019-07-08 MED ORDER — CELECOXIB 200 MG PO CAPS
ORAL_CAPSULE | ORAL | Status: AC
  Filled 2019-07-08: qty 1

## 2019-07-08 MED ORDER — CHLORHEXIDINE GLUCONATE CLOTH 2 % EX PADS: 6.0000 | MEDICATED_PAD | Freq: Once | CUTANEOUS | Status: DC

## 2019-07-08 MED ORDER — EPHEDRINE SULFATE 50 MG/ML IJ SOLN
INTRAMUSCULAR | Status: DC | PRN
  Administered 2019-07-08 (×2): 10 mg via INTRAVENOUS

## 2019-07-08 MED ORDER — MEPERIDINE HCL 25 MG/ML IJ SOLN: 6.2500 mg | INTRAMUSCULAR | Status: DC | PRN

## 2019-07-08 MED ORDER — SCOPOLAMINE 1 MG/3DAYS TD PT72
1.0000 | MEDICATED_PATCH | Freq: Once | TRANSDERMAL | Status: AC
  Administered 2019-07-08: 10:00:00 1 via TRANSDERMAL
  Administered 2019-07-08: 1.5 mg via TRANSDERMAL

## 2019-07-08 MED ORDER — METHYLENE BLUE 0.5 % INJ SOLN
INTRAVENOUS | Status: AC
  Filled 2019-07-08: qty 10

## 2019-07-08 MED ORDER — HEPARIN (PORCINE) IN NACL 1000-0.9 UT/500ML-% IV SOLN
INTRAVENOUS | Status: AC
  Filled 2019-07-08: qty 1000

## 2019-07-08 MED ORDER — MIDAZOLAM HCL 2 MG/2ML IJ SOLN
1.0000 mg | INTRAMUSCULAR | Status: DC | PRN
  Administered 2019-07-08: 10:00:00 2 mg via INTRAVENOUS

## 2019-07-08 MED ORDER — OXYCODONE HCL 5 MG PO TABS: 5.0000 mg | ORAL_TABLET | Freq: Four times a day (QID) | ORAL | 0 refills | Status: DC | PRN

## 2019-07-08 MED ORDER — LACTATED RINGERS IV SOLN
INTRAVENOUS | Status: DC
  Administered 2019-07-08: 09:00:00 via INTRAVENOUS

## 2019-07-08 MED ORDER — SCOPOLAMINE 1 MG/3DAYS TD PT72
MEDICATED_PATCH | TRANSDERMAL | Status: AC
  Filled 2019-07-08: qty 1

## 2019-07-08 MED ORDER — DEXTROSE 5 % IV SOLN
3.0000 g | INTRAVENOUS | Status: AC
  Administered 2019-07-08: 2 g via INTRAVENOUS

## 2019-07-08 MED ORDER — DEXAMETHASONE SODIUM PHOSPHATE 4 MG/ML IJ SOLN
INTRAMUSCULAR | Status: DC | PRN
  Administered 2019-07-08: 5 mg via INTRAVENOUS

## 2019-07-08 MED ORDER — FENTANYL CITRATE (PF) 100 MCG/2ML IJ SOLN
INTRAMUSCULAR | Status: AC
  Filled 2019-07-08: qty 2

## 2019-07-08 MED ORDER — IBUPROFEN 800 MG PO TABS: 800.0000 mg | ORAL_TABLET | Freq: Three times a day (TID) | ORAL | 0 refills | Status: DC | PRN

## 2019-07-08 MED ORDER — HEPARIN SOD (PORK) LOCK FLUSH 100 UNIT/ML IV SOLN
INTRAVENOUS | Status: AC
  Filled 2019-07-08: qty 10

## 2019-07-08 MED ORDER — BUPIVACAINE-EPINEPHRINE 0.5% -1:200000 IJ SOLN
INTRAMUSCULAR | Status: DC | PRN
  Administered 2019-07-08: 17 mL

## 2019-07-08 MED ORDER — DEXAMETHASONE SODIUM PHOSPHATE 10 MG/ML IJ SOLN
INTRAMUSCULAR | Status: AC
  Filled 2019-07-08: qty 1

## 2019-07-08 MED ORDER — ONDANSETRON HCL 4 MG/2ML IJ SOLN: 4.0000 mg | Freq: Once | INTRAMUSCULAR | Status: DC | PRN

## 2019-07-08 SURGICAL SUPPLY — 49 items
ADH SKN CLS APL DERMABOND .7 (GAUZE/BANDAGES/DRESSINGS) ×1
APL PRP STRL LF DISP 70% ISPRP (MISCELLANEOUS) ×1
APPLIER CLIP 9.375 MED OPEN (MISCELLANEOUS) ×3
APR CLP MED 9.3 20 MLT OPN (MISCELLANEOUS) ×1
BINDER BREAST LRG (GAUZE/BANDAGES/DRESSINGS) IMPLANT
BINDER BREAST XLRG (GAUZE/BANDAGES/DRESSINGS) ×3 IMPLANT
BLADE SURG 15 STRL LF DISP TIS (BLADE) ×1 IMPLANT
BLADE SURG 15 STRL SS (BLADE) ×3
CANISTER SUCT 1200ML W/VALVE (MISCELLANEOUS) ×2 IMPLANT
CHLORAPREP W/TINT 26 (MISCELLANEOUS) ×3 IMPLANT
CLIP APPLIE 9.375 MED OPEN (MISCELLANEOUS) IMPLANT
COVER BACK TABLE REUSABLE LG (DRAPES) ×3 IMPLANT
COVER MAYO STAND REUSABLE (DRAPES) ×3 IMPLANT
COVER PROBE W GEL 5X96 (DRAPES) ×3 IMPLANT
DERMABOND ADVANCED (GAUZE/BANDAGES/DRESSINGS) ×2
DERMABOND ADVANCED .7 DNX12 (GAUZE/BANDAGES/DRESSINGS) ×1 IMPLANT
DRAPE LAPAROTOMY 100X72 PEDS (DRAPES) ×3 IMPLANT
DRAPE UTILITY XL STRL (DRAPES) ×3 IMPLANT
ELECT COATED BLADE 2.86 ST (ELECTRODE) ×3 IMPLANT
ELECT REM PT RETURN 9FT ADLT (ELECTROSURGICAL) ×3
ELECTRODE REM PT RTRN 9FT ADLT (ELECTROSURGICAL) ×1 IMPLANT
GLOVE BIO SURGEON STRL SZ7 (GLOVE) ×2 IMPLANT
GLOVE BIOGEL PI IND STRL 7.0 (GLOVE) IMPLANT
GLOVE BIOGEL PI IND STRL 7.5 (GLOVE) IMPLANT
GLOVE BIOGEL PI IND STRL 8 (GLOVE) ×1 IMPLANT
GLOVE BIOGEL PI INDICATOR 7.0 (GLOVE) ×2
GLOVE BIOGEL PI INDICATOR 7.5 (GLOVE) ×2
GLOVE BIOGEL PI INDICATOR 8 (GLOVE) ×2
GLOVE ECLIPSE 8.0 STRL XLNG CF (GLOVE) ×3 IMPLANT
GOWN STRL REUS W/ TWL LRG LVL3 (GOWN DISPOSABLE) ×2 IMPLANT
GOWN STRL REUS W/TWL LRG LVL3 (GOWN DISPOSABLE) ×6
HEMOSTAT ARISTA ABSORB 3G PWDR (HEMOSTASIS) IMPLANT
HEMOSTAT SNOW SURGICEL 2X4 (HEMOSTASIS) IMPLANT
KIT MARKER MARGIN INK (KITS) ×3 IMPLANT
NDL HYPO 25X1 1.5 SAFETY (NEEDLE) ×1 IMPLANT
NEEDLE HYPO 25X1 1.5 SAFETY (NEEDLE) ×3 IMPLANT
NS IRRIG 1000ML POUR BTL (IV SOLUTION) ×3 IMPLANT
PACK BASIN DAY SURGERY FS (CUSTOM PROCEDURE TRAY) ×3 IMPLANT
PENCIL BUTTON HOLSTER BLD 10FT (ELECTRODE) ×3 IMPLANT
SLEEVE SCD COMPRESS KNEE MED (MISCELLANEOUS) ×3 IMPLANT
SPONGE LAP 4X18 RFD (DISPOSABLE) ×3 IMPLANT
SUT MNCRL AB 4-0 PS2 18 (SUTURE) ×3 IMPLANT
SUT VICRYL 3-0 CR8 SH (SUTURE) ×3 IMPLANT
SYR CONTROL 10ML LL (SYRINGE) ×3 IMPLANT
TOWEL GREEN STERILE FF (TOWEL DISPOSABLE) ×3 IMPLANT
TRAY FAXITRON CT DISP (TRAY / TRAY PROCEDURE) ×3 IMPLANT
TUBE CONNECTING 20'X1/4 (TUBING) ×1
TUBE CONNECTING 20X1/4 (TUBING) ×2 IMPLANT
YANKAUER SUCT BULB TIP NO VENT (SUCTIONS) ×2 IMPLANT

## 2019-07-08 NOTE — Discharge Instructions (Signed)
[place] Office Phone Number [phone]  BREAST BIOPSY/ PARTIAL MASTECTOMY: POST OP INSTRUCTIONS  Always review your discharge instruction sheet given to you by the facility where your surgery was performed.  IF YOU HAVE DISABILITY OR FAMILY LEAVE FORMS, YOU MUST BRING THEM TO THE OFFICE FOR PROCESSING.  DO NOT GIVE THEM TO YOUR DOCTOR.  1. A prescription for pain medication may be given to you upon discharge.  Take your pain medication as prescribed, if needed.  If narcotic pain medicine is not needed, then you may take acetaminophen (Tylenol) or ibuprofen (Advil) as needed. No Tylenol until 3pm, no ibuprofen until 5pm 2. Take your usually prescribed medications unless otherwise directed 3. If you need a refill on your pain medication, please contact your pharmacy.  They will contact our office to request authorization.  Prescriptions will not be filled after 5pm or on week-ends. 4. You should eat very light the first 24 hours after surgery, such as soup, crackers, pudding, etc.  Resume your normal diet the day after surgery. 5. Most patients will experience some swelling and bruising in the breast.  Ice packs and a good support bra will help.  Swelling and bruising can take several days to resolve.  6. It is common to experience some constipation if taking pain medication after surgery.  Increasing fluid intake and taking a stool softener will usually help or prevent this problem from occurring.  A mild laxative (Milk of Magnesia or Miralax) should be taken according to package directions if there are no bowel movements after 48 hours. 7. Unless discharge instructions indicate otherwise, you may remove your bandages 24-48 hours after surgery, and you may shower at that time.  You may have steri-strips (small skin tapes) in place directly over the incision.  These strips should be left on the skin for 7-10 days.  If your surgeon used skin glue on the incision, you may shower in  24 hours.  The glue will flake off over the next 2-3 weeks.  Any sutures or staples will be removed at the office during your follow-up visit. 8. ACTIVITIES:  You may resume regular daily activities (gradually increasing) beginning the next day.  Wearing a good support bra or sports bra minimizes pain and swelling.  You may have sexual intercourse when it is comfortable. a. You may drive when you no longer are taking prescription pain medication, you can comfortably wear a seatbelt, and you can safely maneuver your car and apply brakes. b. RETURN TO WORK:  ______________________________________________________________________________________ 9. You should see your doctor in the office for a follow-up appointment approximately two weeks after your surgery.  Your doctors nurse will typically make your follow-up appointment when she calls you with your pathology report.  Expect your pathology report 2-3 business days after your surgery.  You may call to check if you do not hear from [place] after three days. 10. OTHER INSTRUCTIONS: _______________________________________________________________________________________________ _____________________________________________________________________________________________________________________________________ _____________________________________________________________________________________________________________________________________ _____________________________________________________________________________________________________________________________________  WHEN TO CALL YOUR DOCTOR: 1. Fever over 101.0 2. Nausea and/or vomiting. 3. Extreme swelling or bruising. 4. Continued bleeding from incision. 5. Increased pain, redness, or drainage from the incision.  The clinic staff is available to answer your questions during regular business hours.  Please dont hesitate to call and ask to speak to one of the nurses for clinical concerns.  If you have  a medical emergency, go to the nearest emergency room or call 911.  A surgeon from [facility] is always on call at the hospital.  For further questions, please visit [email or web address]     Post Anesthesia Home Care Instructions  Activity: Get plenty of rest for the remainder of the day. A responsible individual must stay with you for 24 hours following the procedure.  For the next 24 hours, DO NOT: -Drive a car -Paediatric nurse -Drink alcoholic beverages -Take any medication unless instructed by your physician -Make any legal decisions or sign important papers.  Meals: Start with liquid foods such as gelatin or soup. Progress to regular foods as tolerated. Avoid greasy, spicy, heavy foods. If nausea and/or vomiting occur, drink only clear liquids until the nausea and/or vomiting subsides. Call your physician if vomiting continues.  Special Instructions/Symptoms: Your throat may feel dry or sore from the anesthesia or the breathing tube placed in your throat during surgery. If this causes discomfort, gargle with warm salt water. The discomfort should disappear within 24 hours.  If you had a scopolamine patch placed behind your ear for the management of post- operative nausea and/or vomiting:  1. The medication in the patch is effective for 72 hours, after which it should be removed.  Wrap patch in a tissue and discard in the trash. Wash hands thoroughly with soap and water. 2. You may remove the patch earlier than 72 hours if you experience unpleasant side effects which may include dry mouth, dizziness or visual disturbances. 3. Avoid touching the patch. Wash your hands with soap and water after contact with the patch.

## 2019-07-08 NOTE — H&P (Signed)
Toniann [facility] Patient #: [id number] DOB: 09-Dec-1965 Married / Language: English / Race: Black or African American Female  History of Present Illness Patient words: Patient sent at the request of Dr. Rosana Hoes due to abnormal screening mammogram. The patient underwent screening mammography which showed a 4 cm cluster of left breast microcalcifications that were pleomorphic in the upper outer quadrant. Core biopsy showed low-grade DCIS. The patient denies any history of breast pain, nipple discharge or change in appearance of her breast. She has a sister who was diagnosed with breast cancer at age 50 restore alive. No other family history.                   Recall from screening mammography with tomosynthesis, calcifications involving the LOWER INNER QUADRANT of the LEFT breast. Family history of breast cancer in her sister who was diagnosed at age 53. EXAM: DIGITAL DIAGNOSTIC LEFT MAMMOGRAM WITH CAD COMPARISON: Previous exam(s). ACR Breast Density Category c: The breast tissue is heterogeneously dense, which may obscure small masses. FINDINGS: Standard spot magnification CC and mediolateral views of the LEFT breast calcifications and a standard 2D full field mediolateral view of the LEFT breast were obtained. Spot magnification views confirm amorphous and fine heterogeneous calcifications in the LOWER INNER QUADRANT at MIDDLE to POSTERIOR depth which span approximately 4 x 4 x 3 cm (AP x coronal x craniocaudal), some of which are more tightly grouped. There is no definite layering of the calcifications on the mediolateral image. The calcifications are in a segmental distribution. No suspicious findings elsewhere in the LEFT breast on the full field mediolateral image. Mammographic images were processed with CAD. IMPRESSION: Indeterminate calcifications involving the LOWER INNER QUADRANT of the LEFT breast.  RECOMMENDATION: Stereotactic tomosynthesis core needle biopsy of the LEFT breast calcifications. The stereotactic tomosynthesis biopsy procedure was discussed with patient her questions were answered. She has agreed to proceed in the biopsy has been scheduled for Wednesday, July 8 at 8:30 a.m. I have discussed the findings and recommendations with the patient. BI-RADS CATEGORY 4: Suspicious. Electronically Signed By: Evangeline Dakin M.D. On: 04/17/2019 09:22  Result History  MM Digital Diagnostic [place] (Order [id number]) on 04/17/2019 - Order Result History Report <[email or web address]>  MM 3D SCREEN BREAST BILATERAL (Order [id number]) Study Result  CLINICAL DATA: Screening. EXAM: DIGITAL SCREENING BILATERAL MAMMOGRAM WITH TOMO AND CAD COMPARISON: Previous exam(s). ACR Breast Density Category c: The breast tissue is heterogeneously dense, which may obscure small masses. FINDINGS: In the left breast, calcifications warrant further evaluation. In the right breast, no findings suspicious for malignancy. Images were processed with CAD. IMPRESSION: Further evaluation is suggested for calcifications in the left breast. RECOMMENDATION: Diagnostic mammogram of the left breast. (Code:[id number]) The patient will be contacted regarding the findings, and additional imaging will be scheduled. BI-RADS CATEGORY 0: Incomplete. Need additional imaging evaluation and/or prior mammograms for comparison. Electronically Signed By: Lovey Newcomer M.D. On: 04/11/2019 12:53                 ADDITIONAL INFORMATION: PROGNOSTIC INDICATORS Results: IMMUNOHISTOCHEMICAL AND MORPHOMETRIC ANALYSIS PERFORMED MANUALLY Estrogen Receptor: 90%, POSITIVE, STRONG STAINING INTENSITY Progesterone Receptor: 60%, POSITIVE, MODERATE STAINING INTENSITY REFERENCE RANGE ESTROGEN RECEPTOR NEGATIVE 0% POSITIVE =>1% REFERENCE RANGE PROGESTERONE RECEPTOR NEGATIVE  0% POSITIVE =>1% All controls stained appropriately Vicente Males MD Pathologist, Electronic Signature ( Signed 04/25/2019) FINAL DIAGNOSIS Diagnosis Breast, left, needle core biopsy, LIQ - DUCTAL CARCINOMA IN SITU WITH CALCIFICATIONS, SEE COMMENT. - FIBROCYSTIC AND FIBROADENOMATOID CHANGE. 1 of  2 FINAL for MARGRETTA, GOUCHER [id number]) Microscopic Comment The carcinoma appears low grade. Prognostic markers will be ordered. Dr. Lyndon Code has reviewed the case. The case was called to [facility] on 04/24/2019. Vicente Males MD Pathologist, Electronic Signature (Case signed 04/24/2019) Specimen Gross and Clinical Information Specimen Comment In formalin 8:50, extracted less than 1 min; indeterminate.  The patient is a 53 year old female.   Past Surgical History  Cesarean Section - Multiple Foot Surgery Bilateral.  Diagnostic Studies History  Colonoscopy 1-5 years ago Mammogram 1-3 years ago  Allergies  Amitriptyline HCl *ANTIDEPRESSANTS* Allergies Reconciled  Medication History  hydroCHLOROthiazide (25MG  Tablet, Oral) Active. Multi-Vitamin (Oral) Active. Medications Reconciled  Social History  Alcohol use Moderate alcohol use. Caffeine use Coffee. No drug use Tobacco use Never smoker.  Family History  Breast Cancer Sister. Hypertension Mother. Migraine Headache Mother.  Pregnancy / Birth History  Age at menarche 69 years. Age of menopause 59-55 Gravida 4 Irregular periods Length (months) of breastfeeding 3-6 Maternal age 71-25 Para 2  Other Problems  High blood pressure Lump In Breast Migraine Headache     Review of Systems  General Not Present- Appetite Loss, Chills, Fatigue, Fever, Night Sweats, Weight Gain and Weight Loss. Skin Not Present- Change in Wart/Mole, Dryness, Hives, Jaundice, New Lesions, Non-Healing Wounds, Rash and Ulcer. HEENT Not Present- Earache, Hearing Loss, Hoarseness, Nose  Bleed, Oral Ulcers, Ringing in the Ears, Seasonal Allergies, Sinus Pain, Sore Throat, Visual Disturbances, Wears glasses/contact lenses and Yellow Eyes. Breast Present- Breast Mass and Breast Pain. Not Present- Nipple Discharge and Skin Changes. Cardiovascular Not Present- Chest Pain, Difficulty Breathing Lying Down, Leg Cramps, Palpitations, Rapid Heart Rate, Shortness of Breath and Swelling of Extremities. Gastrointestinal Not Present- Abdominal Pain, Bloating, Bloody Stool, Change in Bowel Habits, Chronic diarrhea, Constipation, Difficulty Swallowing, Excessive gas, Gets full quickly at meals, Hemorrhoids, Indigestion, Nausea, Rectal Pain and Vomiting. Female Genitourinary Not Present- Frequency, Nocturia, Painful Urination, Pelvic Pain and Urgency. Musculoskeletal Not Present- Back Pain, Joint Pain, Joint Stiffness, Muscle Pain, Muscle Weakness and Swelling of Extremities. Neurological Not Present- Decreased Memory, Fainting, Headaches, Numbness, Seizures, Tingling, Tremor, Trouble walking and Weakness. Psychiatric Not Present- Anxiety, Bipolar, Change in Sleep Pattern, Depression, Fearful and Frequent crying. Endocrine Not Present- Cold Intolerance, Excessive Hunger, Hair Changes, Heat Intolerance, Hot flashes and New Diabetes. Hematology Not Present- Blood Thinners, Easy Bruising, Excessive bleeding, Gland problems, HIV and Persistent Infections.  Vitals  05/02/2019 9:47 AM Weight: 182.4 lb Height: 64.5in Body Surface Area: 1.89 m Body Mass Index: 30.83 kg/m  Temp.: 98.51F  Pulse: 90 (Regular)  BP: 132/84 (Sitting, Left Arm, Standard)        Physical Exam  General Mental Status-Alert. General Appearance-Consistent with stated age. Hydration-Well hydrated. Voice-Normal.  Head and Neck Head-normocephalic, atraumatic with no lesions or palpable masses.  Chest and Lung Exam Chest and lung exam reveals -quiet, even and easy respiratory effort  with no use of accessory muscles and on auscultation, normal breath sounds, no adventitious sounds and normal vocal resonance. Inspection Chest Wall - Normal. Back - normal.  Breast Breast - Left-Symmetric, Non Tender, No Biopsy scars, no Dimpling - Left, No Inflammation, No Lumpectomy scars, No Mastectomy scars, No Peau d' Orange. Breast - Right-Symmetric, Non Tender, No Biopsy scars, no Dimpling - Right, No Inflammation, No Lumpectomy scars, No Mastectomy scars, No Peau d' Orange. Breast Lump-No Palpable Breast Mass. Note: Small left breast hematoma  Cardiovascular Cardiovascular examination reveals -on palpation PMI is normal in location and  amplitude, no palpable S3 or S4. Normal cardiac borders., normal heart sounds, regular rate and rhythm with no murmurs, carotid auscultation reveals no bruits and normal pedal pulses bilaterally.  Lymphatic Head & Neck  General Head & Neck Lymphatics: Bilateral - Description - Normal. Axillary  General Axillary Region: Bilateral - Description - Normal. Tenderness - Non Tender.    Assessment & Plan   DUCTAL CARCINOMA IN SITU (DCIS) OF LEFT BREAST (D05.12) Impression: Low-grade Discussed, COMET trial with her and husband and she is not interested  She has opted for left breast lumpectomy. She will be referred to medical and radiation oncology as well as genetics insertion history of breast cancer in her 23s. Discuss treatment options for DCIS to include mastectomy and reconstruction, breast conservation followed by radiation therapy and possible chemotherapy prevention.   Risk of lumpectomy include bleeding, infection, seroma, more surgery, use of seed/wire, wound care, cosmetic deformity and the need for other treatments, death , blood clots, death. Pt agrees to proceed.   Patient is interested IN breast reduction therefore for plastic surgery  Current Plans Pt Education - [facility] Breast Biopsy HCI: discussed with  patient and provided information. Pt Education - ABC (After Breast Cancer) Class Info: discussed with patient and provided information. You are being scheduled for surgery- Our schedulers will call you.  You should hear from our office's scheduling department within 5 working days about the location, date, and time of surgery. We try to make accommodations for patient's preferences in scheduling surgery, but sometimes the OR schedule or the surgeon's schedule prevents [place] from making those accommodations.  If you have not heard from our office [phone]) in 5 working days, call the office and ask for your surgeon's nurse.  If you have other questions about your diagnosis, plan, or surgery, call the office and ask for your surgeon's nurse.  We discussed the staging and pathophysiology of breast cancer. We discussed all of the different options for treatment for breast cancer including surgery, chemotherapy, radiation therapy, Herceptin, and antiestrogen therapy. We discussed a sentinel lymph node biopsy as she does not appear to having lymph node involvement right now. We discussed the performance of that with injection of radioactive tracer and blue dye. We discussed that she would have an incision underneath her axillary hairline. We discussed that there is a bout a 10-20% chance of having a positive node with a sentinel lymph node biopsy and we will await the permanent pathology to make any other first further decisions in terms of her treatment. One of these options might be to return to the operating room to perform an axillary lymph node dissection. We discussed about a 1-2% risk lifetime of chronic shoulder pain as well as lymphedema associated with a sentinel lymph node biopsy. We discussed the options for treatment of the breast cancer which included lumpectomy versus a mastectomy. We discussed the performance of the lumpectomy with a wire placement. We discussed a 10-20% chance of a  positive margin requiring reexcision in the operating room. We also discussed that she may need radiation therapy or antiestrogen therapy or both if she undergoes lumpectomy. We discussed the mastectomy and the postoperative care for that as well. We discussed that there is no difference in her survival whether she undergoes lumpectomy with radiation therapy or antiestrogen therapy versus a mastectomy. There is a slight difference in the local recurrence rate being 3-5% with lumpectomy and about 1% with a mastectomy. We discussed the risks of operation including bleeding, infection,  possible reoperation. She understands her further therapy will be based on what her stages at the time of her operation.

## 2019-07-08 NOTE — Anesthesia Procedure Notes (Addendum)
Procedure Name: LMA Insertion Date/Time: 07/08/2019 10:20 AM Performed by: Maryella Shivers, CRNA Pre-anesthesia Checklist: Patient identified, Emergency Drugs available, Suction available and Patient being monitored Patient Re-evaluated:Patient Re-evaluated prior to induction Oxygen Delivery Method: Circle system utilized Preoxygenation: Pre-oxygenation with 100% oxygen Induction Type: IV induction Ventilation: Mask ventilation without difficulty LMA: LMA inserted LMA Size: 4.0 Number of attempts: 1 Airway Equipment and Method: Bite block Placement Confirmation: positive ETCO2 Tube secured with: Tape Dental Injury: Teeth and Oropharynx as per pre-operative assessment

## 2019-07-08 NOTE — Anesthesia Preprocedure Evaluation (Signed)
Anesthesia Evaluation  Patient identified by MRN, date of birth, ID band Patient awake    Reviewed: Allergy & Precautions, NPO status , Patient's Chart, lab work & pertinent test results, Unable to perform ROS - Chart review only  History of Anesthesia Complications (+) PONV and history of anesthetic complications  Airway Mallampati: II  TM Distance: >3 FB Neck ROM: Full    Dental no notable dental hx. (+) Partial Upper, Dental Advisory Given   Pulmonary sleep apnea and Continuous Positive Airway Pressure Ventilation ,    Pulmonary exam normal breath sounds clear to auscultation       Cardiovascular hypertension, Pt. on medications Normal cardiovascular exam Rhythm:Regular Rate:Normal     Neuro/Psych  Headaches, PSYCHIATRIC DISORDERS Depression TIA Neuromuscular disease    GI/Hepatic negative GI ROS, Neg liver ROS,   Endo/Other  Left Breast Ca  Renal/GU negative Renal ROS  negative genitourinary   Musculoskeletal negative musculoskeletal ROS (+)   Abdominal (+) + obese,   Peds  Hematology negative hematology ROS (+)   Anesthesia Other Findings   Reproductive/Obstetrics                             Anesthesia Physical Anesthesia Plan  ASA: II  Anesthesia Plan: General   Post-op Pain Management:    Induction:   PONV Risk Score and Plan: 4 or greater and Scopolamine patch - Pre-op, Midazolam, Ondansetron, Dexamethasone and Treatment may vary due to age or medical condition  Airway Management Planned: LMA  Additional Equipment:   Intra-op Plan:   Post-operative Plan: Extubation in OR  Informed Consent: I have reviewed the patients History and Physical, chart, labs and discussed the procedure including the risks, benefits and alternatives for the proposed anesthesia with the patient or authorized representative who has indicated his/her understanding and acceptance.     Dental  advisory given  Plan Discussed with: CRNA and Surgeon  Anesthesia Plan Comments:         Anesthesia Quick Evaluation

## 2019-07-08 NOTE — Op Note (Signed)
Preoperative diagnosis: Left breast DCIS  Postop diagnosis: Same  Procedure: Left breast seed localized lumpectomy x3  Surgeon: Erroll Luna, MD  Anesthesia: General with 0.25% Sensorcaine local with epinephrine  EBL: 20 cc  Specimen: Left breast tissue containing all 3 seeds and all 3 clips with grossly negative margins verified by Faxitron  Drains: None  IV fluids: Per anesthesia record  Indications for procedure: The patient is a 53 year old female with left breast microcalcifications on recent screening mammogram.  Core biopsy was done which showed an area of 4 cm involving her left lower inner quadrant of her breast consistent with DCIS.  She desired breast conservation presents today for left breast lumpectomy.  She will undergo oncoplastic reconstruction once her margins are clear by plastic surgery and she has been marked preoperatively for that as well.The procedure has been discussed with the patient. Alternatives to surgery have been discussed with the patient.  Risks of surgery include bleeding,  Infection,  Seroma formation, death,  and the need for further surgery.   The patient understands and wishes to proceed.   Description of procedure: The patient was met in the holding area.  The left side was marked as the correct side and neoprobe was used to verify all 3 seeds seed locations.  She was taken back to the operative room.  She is placed supine upon the OR table.  After induction of general esthesia the left breast was prepped and draped in sterile fashion timeout was performed.  She received appropriate preoperative antibiotics.  She was marked by the plastic surgeon preoperatively and the keyhole pattern was noted.  The area of disease was in her left lower inner quadrant.  Incision was made along the marked site on her left breast for oncoplastic reconstruction.  Dissection was carried around all tissue which include all 3 seeds.  Margins were grossly negative and the  tissue was taken out as one specimen with 3 seeds.  Faxitron revealed all 3 seeds and all 3 clips to be in the specimen and the margins were grossly negative.  Hemostasis was achieved.  Irrigation used.  Wound closed with 3-0 Vicryl and 4-0 Monocryl.  Dermabond applied.  All final counts found to be correct.  Breast binder placed.  Patient was awoke extubated taken recovery in satisfactory condition.

## 2019-07-08 NOTE — Transfer of Care (Signed)
Immediate Anesthesia Transfer of Care Note  Patient: Kara [name]  Procedure(s) Performed: LEFT BREAST LUMPECTOMY X 3  WITH RADIOACTIVE SEED LOCALIZATION (Left Breast)  Patient Location: PACU  Anesthesia Type:General  Level of Consciousness: drowsy  Airway & Oxygen Therapy: Patient Spontanous Breathing and Patient connected to face mask oxygen  Post-op Assessment: Report given to RN and Post -op Vital signs reviewed and stable  Post vital signs: Reviewed and stable  Last Vitals:  Vitals Value Taken Time  BP    Temp    Pulse 76 07/08/19 1115  Resp 0 07/08/19 1116  SpO2 100 % 07/08/19 1115  Vitals shown include unvalidated device data.  Last Pain:  Vitals:   07/08/19 0846  TempSrc: Oral  PainSc: 0-No pain      Patients Stated Pain Goal: 3 ([id number])  Complications: No apparent anesthesia complications

## 2019-07-08 NOTE — Interval H&P Note (Signed)
History and Physical Interval Note:  07/08/2019 10:10 AM  Kara [name]  has presented today for surgery, with the diagnosis of LEFT BREAST DCIS.  The various methods of treatment have been discussed with the patient and family. After consideration of risks, benefits and other options for treatment, the patient has consented to  Procedure(s): LEFT BREAST LUMPECTOMY X 3  WITH RADIOACTIVE SEED LOCALIZATION (Left) as a surgical intervention.  The patient's history has been reviewed, patient examined, no change in status, stable for surgery.  I have reviewed the patient's chart and labs.  Questions were answered to the patient's satisfaction.     [place]

## 2019-07-08 NOTE — Anesthesia Postprocedure Evaluation (Signed)
Anesthesia Post Note  Patient: Kara [name]  Procedure(s) Performed: LEFT BREAST LUMPECTOMY X 3  WITH RADIOACTIVE SEED LOCALIZATION (Left Breast)     Patient location during evaluation: PACU Anesthesia Type: General Level of consciousness: awake and alert and oriented Pain management: pain level controlled Vital Signs Assessment: post-procedure vital signs reviewed and stable Respiratory status: spontaneous breathing, nonlabored ventilation and respiratory function stable Cardiovascular status: blood pressure returned to baseline and stable Postop Assessment: no apparent nausea or vomiting Anesthetic complications: no    Last Vitals:  Vitals:   07/08/19 1145 07/08/19 1215  BP: 128/79 135/83  Pulse: 65 67  Resp: 13 16  Temp:  36.5 C  SpO2: 100% 100%    Last Pain:  Vitals:   07/08/19 1215  TempSrc:   PainSc: 0-No pain                 Symon Norwood A.

## 2019-07-09 ENCOUNTER — Other Ambulatory Visit: Payer: Self-pay

## 2019-07-09 ENCOUNTER — Encounter (HOSPITAL_BASED_OUTPATIENT_CLINIC_OR_DEPARTMENT_OTHER): Payer: Self-pay | Admitting: Surgery

## 2019-07-10 ENCOUNTER — Telehealth: Payer: Self-pay | Admitting: Adult Health

## 2019-07-10 LAB — SURGICAL PATHOLOGY

## 2019-07-10 NOTE — Telephone Encounter (Signed)
[place] [facility] 10/12 moved appointment from 10/12 to 10/14. Confirmed with patient.

## 2019-07-11 ENCOUNTER — Other Ambulatory Visit (HOSPITAL_COMMUNITY)
Admit: 2019-07-11 | Discharge: 2019-07-11 | Disposition: A | Discharge status: Home / Self Care | Payer: 59 | Source: Ambulatory Visit | Attending: Plastic Surgery | Admitting: Plastic Surgery

## 2019-07-11 DIAGNOSIS — Z01812 Encounter for preprocedural laboratory examination: Secondary | ICD-10-CM | POA: Insufficient documentation

## 2019-07-11 DIAGNOSIS — Z20828 Contact with and (suspected) exposure to other viral communicable diseases: Secondary | ICD-10-CM | POA: Diagnosis not present

## 2019-07-11 NOTE — Progress Notes (Signed)

## 2019-07-12 LAB — NOVEL CORONAVIRUS, NAA (HOSP ORDER, SEND-OUT TO REF LAB; TAT 18-24 HRS): SARS-CoV-2, NAA: NOT DETECTED

## 2019-07-14 NOTE — H&P (Signed)
Subjective:     Patient ID: Kara [name] is a 53 y.o. female.  HPI  Here staged breast reconstruction following left breast lumpectomy. Presented following screening MMG with 4cm span calcifications left LIQ. Biopsy showed DCIS ER/PR+. Final pathology 0.2 cm focal DCIS low grade arising in a CSL, margins clear.   Sister with breast ca in her 28s. Had lumpectomy per patient.  Prior 103 D. Wt up 15 lb over last year.  PMH significant for OSA. Has had abdomin[place] in [place] 0000000.  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 Lives with husband two children and niece.     Objective:   Physical Exam  Cardiovascular: Normal rate, regular rhythm and normal heart sounds.  Pulmonary/Chest: Effort normal and breath sounds normal.  Lymphadenopathy:    She has no axillary adenopathy.  Abdominal scars faded fine lines  Grade 1 ptosis bilateral SN to nipple R 26.5 L 27 cm BW R 19 L 19 cm Nipple to IMF R 9 L 9 cm    Assessment:     DCIS left breast ER+ s/p lumpectomy left    Plan:     Plan oncoplastic reconstruction as staged procedure with reduction 7-10 d post lumpectomy to ensure pathologic clearance.Reviewed reduction with anchor type scars, drains, post operative visits and limitations, recovery. Diminished sensation nipple and breast skin, risk of nipple loss, wound healing problems, asymmetry. Discussed will have some contraction of breast volume and increased firmness with radiation, less ptosis with aging. Discussed changes with wt gain, loss, aging.Discussed the lower pole location tumoroften result in NAC displacement, distortion contour breast following lumpectomy and RT- purpose of this type reconstruction to prevent this. This would really be the purpose of procedure in my opinion as she desires to preserve all her volume, understands will have to remove some breast tissue to match volume and complete procedure.Reviewed breast lift or trying to correct NAC  displacement post RT more difficult, higher risk complications.Counseled I cannot assure her cupsize.  Additional risks including but not limited to bleeding, seroma, hemattoma, infection, need for additional procedures, damage to adjacent structures, DVT/PE, cardiopulmonary complication, unacceptable cosmetic appearance reviewed.   Discussed risk COVID infectionthrough this elective surgery. Patient will receive COVID testing prior to surgery. Discussed even if patient receivesa negative test result, the tests in some cases may fail to detect the virus or patient maycontract COVID after the test.COVID 19 infectionbefore/during/aftersurgery may result in lead to a higher chance of complication and death.  Plan OP surgery, may have drains.  Irene Limbo, MD [facility] [phone], pin [phone]

## 2019-07-14 NOTE — Anesthesia Preprocedure Evaluation (Addendum)
Anesthesia Evaluation  Patient identified by MRN, date of birth, ID band Patient awake    Reviewed: Allergy & Precautions, NPO status , Patient's Chart, lab work & pertinent test results  History of Anesthesia Complications Negative for: history of anesthetic complications  Airway Mallampati: III  TM Distance: >3 FB Neck ROM: Full    Dental  (+) Dental Advisory Given, Partial Lower   Pulmonary sleep apnea and Continuous Positive Airway Pressure Ventilation ,    Pulmonary exam normal        Cardiovascular hypertension, Pt. on medications Normal cardiovascular exam     Neuro/Psych  Headaches, PSYCHIATRIC DISORDERS Depression    GI/Hepatic negative GI ROS, Neg liver ROS,   Endo/Other   Obesity   Renal/GU negative Renal ROS     Musculoskeletal negative musculoskeletal ROS (+)   Abdominal   Peds  Hematology negative hematology ROS (+)   Anesthesia Other Findings   Reproductive/Obstetrics                            Anesthesia Physical Anesthesia Plan  ASA: II  Anesthesia Plan: General   Post-op Pain Management:    Induction: Intravenous  PONV Risk Score and Plan: 4 or greater and Treatment may vary due to age or medical condition, Ondansetron, Dexamethasone and Midazolam  Airway Management Planned: LMA  Additional Equipment: None  Intra-op Plan:   Post-operative Plan: Extubation in OR  Informed Consent: I have reviewed the patients History and Physical, chart, labs and discussed the procedure including the risks, benefits and alternatives for the proposed anesthesia with the patient or authorized representative who has indicated his/her understanding and acceptance.     Dental advisory given  Plan Discussed with: CRNA and Anesthesiologist  Anesthesia Plan Comments:        Anesthesia Quick Evaluation

## 2019-07-15 ENCOUNTER — Ambulatory Visit (HOSPITAL_BASED_OUTPATIENT_CLINIC_OR_DEPARTMENT_OTHER): Payer: 59 | Admitting: Anesthesiology

## 2019-07-15 ENCOUNTER — Ambulatory Visit (HOSPITAL_BASED_OUTPATIENT_CLINIC_OR_DEPARTMENT_OTHER)
Admission: RE | Admit: 2019-07-15 | Discharge: 2019-07-15 | Disposition: A | Discharge status: Home / Self Care | Payer: 59 | Attending: Plastic Surgery | Admitting: Plastic Surgery

## 2019-07-15 ENCOUNTER — Encounter (HOSPITAL_BASED_OUTPATIENT_CLINIC_OR_DEPARTMENT_OTHER): Payer: Self-pay | Admitting: *Deleted

## 2019-07-15 ENCOUNTER — Encounter (HOSPITAL_BASED_OUTPATIENT_CLINIC_OR_DEPARTMENT_OTHER)
Admission: RE | Disposition: A | Discharge status: Home / Self Care | Payer: Self-pay | Source: Home / Self Care | Attending: Plastic Surgery

## 2019-07-15 ENCOUNTER — Other Ambulatory Visit: Payer: Self-pay

## 2019-07-15 DIAGNOSIS — Z86000 Personal history of in-situ neoplasm of breast: Secondary | ICD-10-CM | POA: Insufficient documentation

## 2019-07-15 DIAGNOSIS — E669 Obesity, unspecified: Secondary | ICD-10-CM | POA: Insufficient documentation

## 2019-07-15 DIAGNOSIS — G4733 Obstructive sleep apnea (adult) (pediatric): Secondary | ICD-10-CM | POA: Insufficient documentation

## 2019-07-15 DIAGNOSIS — I10 Essential (primary) hypertension: Secondary | ICD-10-CM | POA: Insufficient documentation

## 2019-07-15 DIAGNOSIS — F329 Major depressive disorder, single episode, unspecified: Secondary | ICD-10-CM | POA: Diagnosis not present

## 2019-07-15 DIAGNOSIS — Z888 Allergy status to other drugs, medicaments and biological substances status: Secondary | ICD-10-CM | POA: Insufficient documentation

## 2019-07-15 DIAGNOSIS — Z6830 Body mass index (BMI) 30.0-30.9, adult: Secondary | ICD-10-CM | POA: Insufficient documentation

## 2019-07-15 DIAGNOSIS — Z421 Encounter for breast reconstruction following mastectomy: Secondary | ICD-10-CM | POA: Diagnosis present

## 2019-07-15 DIAGNOSIS — Z803 Family history of malignant neoplasm of breast: Secondary | ICD-10-CM | POA: Insufficient documentation

## 2019-07-15 HISTORY — PX: BREAST RECONSTRUCTION: SHX9

## 2019-07-15 HISTORY — PX: MASTOPEXY: SHX5358

## 2019-07-15 SURGERY — RECONSTRUCTION, BREAST
Anesthesia: General | Site: Breast | Laterality: Right

## 2019-07-15 MED ORDER — PROMETHAZINE HCL 25 MG/ML IJ SOLN: 6.2500 mg | INTRAMUSCULAR | Status: DC | PRN

## 2019-07-15 MED ORDER — BUPIVACAINE-EPINEPHRINE (PF) 0.5% -1:200000 IJ SOLN
INTRAMUSCULAR | Status: AC
  Filled 2019-07-15: qty 30

## 2019-07-15 MED ORDER — LACTATED RINGERS IV SOLN
INTRAVENOUS | Status: DC
  Administered 2019-07-15 (×2): via INTRAVENOUS

## 2019-07-15 MED ORDER — CEFAZOLIN SODIUM-DEXTROSE 2-4 GM/100ML-% IV SOLN
2.0000 g | INTRAVENOUS | Status: AC
  Administered 2019-07-15: 07:00:00 2 g via INTRAVENOUS

## 2019-07-15 MED ORDER — ONDANSETRON HCL 4 MG/2ML IJ SOLN
INTRAMUSCULAR | Status: AC
  Filled 2019-07-15: qty 2

## 2019-07-15 MED ORDER — GABAPENTIN 300 MG PO CAPS
ORAL_CAPSULE | ORAL | Status: AC
  Filled 2019-07-15: qty 1

## 2019-07-15 MED ORDER — 0.9 % SODIUM CHLORIDE (POUR BTL) OPTIME
TOPICAL | Status: DC | PRN
  Administered 2019-07-15: 1000 mL

## 2019-07-15 MED ORDER — PHENYLEPHRINE 40 MCG/ML (10ML) SYRINGE FOR IV PUSH (FOR BLOOD PRESSURE SUPPORT)
PREFILLED_SYRINGE | INTRAVENOUS | Status: AC
  Filled 2019-07-15: qty 10

## 2019-07-15 MED ORDER — ACETAMINOPHEN 500 MG PO TABS
1000.0000 mg | ORAL_TABLET | ORAL | Status: AC
  Administered 2019-07-15: 07:00:00 1000 mg via ORAL

## 2019-07-15 MED ORDER — SUCCINYLCHOLINE CHLORIDE 200 MG/10ML IV SOSY
PREFILLED_SYRINGE | INTRAVENOUS | Status: AC
  Filled 2019-07-15: qty 10

## 2019-07-15 MED ORDER — LIDOCAINE 2% (20 MG/ML) 5 ML SYRINGE
INTRAMUSCULAR | Status: AC
  Filled 2019-07-15: qty 5

## 2019-07-15 MED ORDER — CEFAZOLIN SODIUM-DEXTROSE 2-4 GM/100ML-% IV SOLN
INTRAVENOUS | Status: AC
  Filled 2019-07-15: qty 100

## 2019-07-15 MED ORDER — CELECOXIB 200 MG PO CAPS
200.0000 mg | ORAL_CAPSULE | ORAL | Status: AC
  Administered 2019-07-15: 200 mg via ORAL

## 2019-07-15 MED ORDER — DIPHENHYDRAMINE HCL 50 MG/ML IJ SOLN
INTRAMUSCULAR | Status: AC
  Filled 2019-07-15: qty 1

## 2019-07-15 MED ORDER — OXYCODONE HCL 5 MG PO TABS: 5.0000 mg | ORAL_TABLET | Freq: Once | ORAL | Status: DC | PRN

## 2019-07-15 MED ORDER — LIDOCAINE HCL (CARDIAC) PF 100 MG/5ML IV SOSY
PREFILLED_SYRINGE | INTRAVENOUS | Status: DC | PRN
  Administered 2019-07-15: 75 mg via INTRAVENOUS

## 2019-07-15 MED ORDER — ACETAMINOPHEN 500 MG PO TABS
ORAL_TABLET | ORAL | Status: AC
  Filled 2019-07-15: qty 2

## 2019-07-15 MED ORDER — GABAPENTIN 300 MG PO CAPS
300.0000 mg | ORAL_CAPSULE | ORAL | Status: AC
  Administered 2019-07-15: 300 mg via ORAL

## 2019-07-15 MED ORDER — OXYCODONE HCL 5 MG/5ML PO SOLN: 5.0000 mg | Freq: Once | ORAL | Status: DC | PRN

## 2019-07-15 MED ORDER — FENTANYL CITRATE (PF) 100 MCG/2ML IJ SOLN
50.0000 ug | INTRAMUSCULAR | Status: AC | PRN
  Administered 2019-07-15: 50 ug via INTRAVENOUS
  Administered 2019-07-15: 07:00:00 100 ug via INTRAVENOUS
  Administered 2019-07-15: 50 ug via INTRAVENOUS

## 2019-07-15 MED ORDER — PROPOFOL 500 MG/50ML IV EMUL
INTRAVENOUS | Status: AC
  Filled 2019-07-15: qty 50

## 2019-07-15 MED ORDER — CELECOXIB 200 MG PO CAPS
ORAL_CAPSULE | ORAL | Status: AC
  Filled 2019-07-15: qty 1

## 2019-07-15 MED ORDER — BUPIVACAINE-EPINEPHRINE 0.5% -1:200000 IJ SOLN
INTRAMUSCULAR | Status: DC | PRN
  Administered 2019-07-15: 30 mL

## 2019-07-15 MED ORDER — MIDAZOLAM HCL 2 MG/2ML IJ SOLN
INTRAMUSCULAR | Status: AC
  Filled 2019-07-15: qty 2

## 2019-07-15 MED ORDER — EPHEDRINE 5 MG/ML INJ
INTRAVENOUS | Status: AC
  Filled 2019-07-15: qty 10

## 2019-07-15 MED ORDER — SCOPOLAMINE 1 MG/3DAYS TD PT72
1.0000 | MEDICATED_PATCH | Freq: Once | TRANSDERMAL | Status: DC
  Administered 2019-07-15: 1.5 mg via TRANSDERMAL

## 2019-07-15 MED ORDER — DIPHENHYDRAMINE HCL 50 MG/ML IJ SOLN
INTRAMUSCULAR | Status: DC | PRN
  Administered 2019-07-15: 12.5 mg via INTRAVENOUS

## 2019-07-15 MED ORDER — FENTANYL CITRATE (PF) 100 MCG/2ML IJ SOLN
INTRAMUSCULAR | Status: AC
  Filled 2019-07-15: qty 2

## 2019-07-15 MED ORDER — MIDAZOLAM HCL 2 MG/2ML IJ SOLN
1.0000 mg | INTRAMUSCULAR | Status: DC | PRN
  Administered 2019-07-15: 2 mg via INTRAVENOUS

## 2019-07-15 MED ORDER — DEXAMETHASONE SODIUM PHOSPHATE 4 MG/ML IJ SOLN
INTRAMUSCULAR | Status: DC | PRN
  Administered 2019-07-15: 10 mg via INTRAVENOUS

## 2019-07-15 MED ORDER — FENTANYL CITRATE (PF) 100 MCG/2ML IJ SOLN
25.0000 ug | INTRAMUSCULAR | Status: DC | PRN
  Administered 2019-07-15 (×2): 25 ug via INTRAVENOUS

## 2019-07-15 MED ORDER — EPHEDRINE SULFATE 50 MG/ML IJ SOLN
INTRAMUSCULAR | Status: DC | PRN
  Administered 2019-07-15 (×3): 10 mg via INTRAVENOUS

## 2019-07-15 MED ORDER — SCOPOLAMINE 1 MG/3DAYS TD PT72
MEDICATED_PATCH | TRANSDERMAL | Status: AC
  Filled 2019-07-15: qty 1

## 2019-07-15 MED ORDER — PROPOFOL 10 MG/ML IV BOLUS
INTRAVENOUS | Status: DC | PRN
  Administered 2019-07-15: 200 mg via INTRAVENOUS

## 2019-07-15 MED ORDER — DEXAMETHASONE SODIUM PHOSPHATE 10 MG/ML IJ SOLN
INTRAMUSCULAR | Status: AC
  Filled 2019-07-15: qty 1

## 2019-07-15 SURGICAL SUPPLY — 82 items
ADH SKN CLS APL DERMABOND .7 (GAUZE/BANDAGES/DRESSINGS) ×4
APL PRP STRL LF DISP 70% ISPRP (MISCELLANEOUS) ×4
BAG DECANTER FOR FLEXI CONT (MISCELLANEOUS) ×4 IMPLANT
BINDER BREAST LRG (GAUZE/BANDAGES/DRESSINGS) IMPLANT
BINDER BREAST MEDIUM (GAUZE/BANDAGES/DRESSINGS) IMPLANT
BINDER BREAST XLRG (GAUZE/BANDAGES/DRESSINGS) ×3 IMPLANT
BINDER BREAST XXLRG (GAUZE/BANDAGES/DRESSINGS) IMPLANT
BLADE SURG 10 STRL SS (BLADE) ×12 IMPLANT
BLADE SURG 15 STRL LF DISP TIS (BLADE) IMPLANT
BLADE SURG 15 STRL SS (BLADE)
BNDG ELASTIC 6X5.8 VLCR STR LF (GAUZE/BANDAGES/DRESSINGS) IMPLANT
BNDG GAUZE ELAST 4 BULKY (GAUZE/BANDAGES/DRESSINGS) ×8 IMPLANT
CANISTER SUCT 1200ML W/VALVE (MISCELLANEOUS) ×4 IMPLANT
CHLORAPREP W/TINT 26 (MISCELLANEOUS) ×6 IMPLANT
CLOSURE WOUND 1/2 X4 (GAUZE/BANDAGES/DRESSINGS)
COVER BACK TABLE REUSABLE LG (DRAPES) ×4 IMPLANT
COVER MAYO STAND REUSABLE (DRAPES) ×4 IMPLANT
COVER WAND RF STERILE (DRAPES) IMPLANT
DECANTER SPIKE VIAL GLASS SM (MISCELLANEOUS) IMPLANT
DERMABOND ADVANCED (GAUZE/BANDAGES/DRESSINGS) ×4
DERMABOND ADVANCED .7 DNX12 (GAUZE/BANDAGES/DRESSINGS) ×3 IMPLANT
DRAIN CHANNEL 15F RND FF W/TCR (WOUND CARE) ×1 IMPLANT
DRAIN CHANNEL 19F RND (DRAIN) IMPLANT
DRAPE HALF SHEET 70X43 (DRAPES) ×10 IMPLANT
DRAPE TOP ARMCOVERS (MISCELLANEOUS) ×4 IMPLANT
DRAPE U-SHAPE 76X120 STRL (DRAPES) ×4 IMPLANT
DRAPE UTILITY XL STRL (DRAPES) ×4 IMPLANT
DRSG PAD ABDOMINAL 8X10 ST (GAUZE/BANDAGES/DRESSINGS) ×8 IMPLANT
DRSG TEGADERM 2-3/8X2-3/4 SM (GAUZE/BANDAGES/DRESSINGS) IMPLANT
ELECT BLADE 4.0 EZ CLEAN MEGAD (MISCELLANEOUS)
ELECT COATED BLADE 2.86 ST (ELECTRODE) ×4 IMPLANT
ELECT REM PT RETURN 9FT ADLT (ELECTROSURGICAL) ×4
ELECTRODE BLDE 4.0 EZ CLN MEGD (MISCELLANEOUS) ×1 IMPLANT
ELECTRODE REM PT RTRN 9FT ADLT (ELECTROSURGICAL) ×2 IMPLANT
EVACUATOR SILICONE 100CC (DRAIN) ×1 IMPLANT
GAUZE SPONGE 4X4 12PLY STRL (GAUZE/BANDAGES/DRESSINGS) IMPLANT
GAUZE SPONGE 4X4 12PLY STRL LF (GAUZE/BANDAGES/DRESSINGS) IMPLANT
GLOVE BIO SURGEON STRL SZ 6 (GLOVE) ×10 IMPLANT
GLOVE BIO SURGEON STRL SZ 6.5 (GLOVE) ×4 IMPLANT
GLOVE BIO SURGEONS STRL SZ 6.5 (GLOVE) ×2
GLOVE BIOGEL PI IND STRL 7.0 (GLOVE) IMPLANT
GLOVE BIOGEL PI INDICATOR 7.0 (GLOVE) ×4
GLOVE ECLIPSE 6.5 STRL STRAW (GLOVE) ×2 IMPLANT
GOWN STRL REUS W/ TWL LRG LVL3 (GOWN DISPOSABLE) ×4 IMPLANT
GOWN STRL REUS W/ TWL XL LVL3 (GOWN DISPOSABLE) IMPLANT
GOWN STRL REUS W/TWL LRG LVL3 (GOWN DISPOSABLE) ×8
GOWN STRL REUS W/TWL XL LVL3 (GOWN DISPOSABLE) ×4
IV NS 500ML (IV SOLUTION)
IV NS 500ML BAXH (IV SOLUTION) ×1 IMPLANT
KIT FILL SYSTEM UNIVERSAL (SET/KITS/TRAYS/PACK) IMPLANT
MARKER SKIN DUAL TIP RULER LAB (MISCELLANEOUS) IMPLANT
NDL HYPO 25X1 1.5 SAFETY (NEEDLE) IMPLANT
NEEDLE HYPO 25X1 1.5 SAFETY (NEEDLE) ×4 IMPLANT
NS IRRIG 1000ML POUR BTL (IV SOLUTION) ×4 IMPLANT
PACK BASIN DAY SURGERY FS (CUSTOM PROCEDURE TRAY) ×4 IMPLANT
PENCIL BUTTON HOLSTER BLD 10FT (ELECTRODE) ×4 IMPLANT
PIN SAFETY STERILE (MISCELLANEOUS) ×2 IMPLANT
PUNCH BIOPSY DERMAL 4MM (MISCELLANEOUS) IMPLANT
SLEEVE SCD COMPRESS KNEE MED (MISCELLANEOUS) ×4 IMPLANT
SPONGE LAP 18X18 RF (DISPOSABLE) ×8 IMPLANT
STAPLER VISISTAT 35W (STAPLE) ×4 IMPLANT
STRIP CLOSURE SKIN 1/2X4 (GAUZE/BANDAGES/DRESSINGS) IMPLANT
SUT ETHILON 2 0 FS 18 (SUTURE) ×1 IMPLANT
SUT MNCRL AB 4-0 PS2 18 (SUTURE) ×8 IMPLANT
SUT PDS 3-0 CT2 (SUTURE)
SUT PDS AB 2-0 CT2 27 (SUTURE) ×2 IMPLANT
SUT PDS II 3-0 CT2 27 ABS (SUTURE) IMPLANT
SUT PROLENE 2 0 CT2 30 (SUTURE) IMPLANT
SUT VIC AB 3-0 PS1 18 (SUTURE) ×12
SUT VIC AB 3-0 PS1 18XBRD (SUTURE) ×2 IMPLANT
SUT VIC AB 3-0 SH 27 (SUTURE)
SUT VIC AB 3-0 SH 27X BRD (SUTURE) IMPLANT
SUT VICRYL 4-0 PS2 18IN ABS (SUTURE) ×7 IMPLANT
SYR 50ML LL SCALE MARK (SYRINGE) IMPLANT
SYR BULB IRRIGATION 50ML (SYRINGE) ×4 IMPLANT
SYR CONTROL 10ML LL (SYRINGE) ×3 IMPLANT
TAPE MEASURE VINYL STERILE (MISCELLANEOUS) ×4 IMPLANT
TOWEL GREEN STERILE FF (TOWEL DISPOSABLE) ×8 IMPLANT
TUBE CONNECTING 20'X1/4 (TUBING) ×1
TUBE CONNECTING 20X1/4 (TUBING) ×3 IMPLANT
UNDERPAD 30X36 HEAVY ABSORB (UNDERPADS AND DIAPERS) ×8 IMPLANT
YANKAUER SUCT BULB TIP NO VENT (SUCTIONS) ×4 IMPLANT

## 2019-07-15 NOTE — Anesthesia Procedure Notes (Signed)
Procedure Name: LMA Insertion Date/Time: 07/15/2019 7:37 AM Performed by: Willa Frater, CRNA Pre-anesthesia Checklist: Patient identified, Emergency Drugs available, Suction available and Patient being monitored Patient Re-evaluated:Patient Re-evaluated prior to induction Oxygen Delivery Method: Circle system utilized Preoxygenation: Pre-oxygenation with 100% oxygen Induction Type: IV induction Ventilation: Mask ventilation without difficulty LMA: LMA inserted LMA Size: 4.0 Number of attempts: 1 Airway Equipment and Method: Bite block Placement Confirmation: positive ETCO2 Tube secured with: Tape Dental Injury: Teeth and Oropharynx as per pre-operative assessment

## 2019-07-15 NOTE — Op Note (Signed)
Operative Note   DATE OF OPERATION: 9.29.20  LOCATION: [facility]-outpatient  SURGICAL DIVISION: Plastic Surgery  PREOPERATIVE DIAGNOSES:  1. DCIS left breast ER+   POSTOPERATIVE DIAGNOSES:  same  PROCEDURE:  1. Left breast oncoplastic reconstruction 2. Right breast mastopexy  SURGEON: Irene Limbo MD MBA  ASSISTANT: none  ANESTHESIA:  General  EBL: 15 ml  COMPLICATIONS: None immediate.   INDICATIONS FOR PROCEDURE:  The patient, Kara [name], is a 53 y.o. female born on [birth year], is here for staged breast reconstruction following left breast lumpectomy for DCIS. Margins clear and now presents for reconstruction.   FINDINGS: Left lumpectomy cavity over lower pole approximately 6 o clock noted. Left breast skin only excision 8.7 g. Right breast mastopexy 19.2 g  DESCRIPTION OF PROCEDURE:  The patient was marked standing in the preoperative area to mark sternal notch, chest midline, anterior axillary lines, inframammary folds. The location of new nipple areolar complex was marked at level of on inframammary fold on anterior surface breast by palpation. This was marked symmetric over bilateral breasts. With aid of Wise pattern marker, location of new nipple areolar complex and vertical limbs (7cm) were markedby displacement of breasts along meridian.The patient was taken to the operating room. SCDs were placedand IV antibioticswere given. The patient's operative site was prepped and draped in a sterile fashion. A time out was performed and all information was confirmed to be correct.   Over left breastsuperiorpedicle designed. 38 mm diameter marker used to mark NAC. The pedicle was deepithelialized.I entered inferior left breast lumpectomy cavity and drained seroma. Medial and lateral flaps developed. Most inferior portion pedicle was rotated beneath nipple areola as autoaugmentation and secured to chest wall with 2-0 PDS. Breast tailor tacked closed.  I then  directed attention to right breast, superior pedicle marked and nipple areolar complex marked with37mm diameter marker. Pedicle deepithlialized and developedto chest wall. Breast tissue resected over lower pole. Medial and lateral flaps developed.Similar autoaugmentation completed with inferior superior pedicle rotated beneath NAC and secured to chest wall with 2-0 PDS suture. Breast tailor tacked closed, and patient brought to upright sitting position and assessed for symmetry. Patient returned to supine position and breast cavities irrigated and hemostasis obtained. Local anesthetic infiltrated throughout each breast.Closure completed bilateralwith 3-0 vicryl to approximate dermis along inframammary fold and vertical limb. NAC inset with 4-0 vicryl in dermis. Skin closure completed with 4-0 monocryl subcuticular throughout. Tissue adhesive applied. Dry dressing and breast binder applied.  The patient was allowed to wake from anesthesia, extubated and taken to the recovery room in satisfactory condition.   SPECIMENS: right and left mastopexy  DRAINS: none  Irene Limbo, MD [facility] [phone], pin [phone]

## 2019-07-15 NOTE — Anesthesia Postprocedure Evaluation (Signed)
Anesthesia Post Note  Patient: Kara [name]  Procedure(s) Performed: LEFT BREAST ONCOPLASTIC RECONSTRUCTION (Left Breast) RIGHT BREAST MASTOPEXY (Right Breast)     Patient location during evaluation: PACU Anesthesia Type: General Level of consciousness: awake and alert Pain management: pain level controlled Vital Signs Assessment: post-procedure vital signs reviewed and stable Respiratory status: spontaneous breathing, nonlabored ventilation and respiratory function stable Cardiovascular status: blood pressure returned to baseline and stable Postop Assessment: no apparent nausea or vomiting Anesthetic complications: no    Last Vitals:  Vitals:   07/15/19 1015 07/15/19 1030  BP: 134/69 134/73  Pulse: 80 84  Resp: 20 13  Temp:    SpO2: 100% 96%    Last Pain:  Vitals:   07/15/19 1030  TempSrc:   PainSc: 2                  Audry Pili

## 2019-07-15 NOTE — Addendum Note (Signed)
Addendum  created 07/15/19 1429 by Willa Frater, CRNA   Charge Capture section accepted

## 2019-07-15 NOTE — Interval H&P Note (Signed)
History and Physical Interval Note:  07/15/2019 7:01 AM  Kara [name]  has presented today for surgery, with the diagnosis of left breast DCIS.  The various methods of treatment have been discussed with the patient and family. After consideration of risks, benefits and other options for treatment, the patient has consented to  Procedure(s): LEFT BREAST ONCOPLASTIC RECONSTRUCTION (Left) RIGHT BREAST MASTOPEXY (Right) as a surgical intervention.  The patient's history has been reviewed, patient examined, no change in status, stable for surgery.  I have reviewed the patient's chart and labs.  Questions were answered to the patient's satisfaction.     Arnoldo Hooker Bryton Romagnoli

## 2019-07-15 NOTE — Discharge Instructions (Signed)
No Tylenol or ibuprofen before 12:30pm.   Post Anesthesia Home Care Instructions  Activity: Get plenty of rest for the remainder of the day. A responsible individual must stay with you for 24 hours following the procedure.  For the next 24 hours, DO NOT: -Drive a car -Paediatric nurse -Drink alcoholic beverages -Take any medication unless instructed by your physician -Make any legal decisions or sign important papers.  Meals: Start with liquid foods such as gelatin or soup. Progress to regular foods as tolerated. Avoid greasy, spicy, heavy foods. If nausea and/or vomiting occur, drink only clear liquids until the nausea and/or vomiting subsides. Call your physician if vomiting continues.  Special Instructions/Symptoms: Your throat may feel dry or sore from the anesthesia or the breathing tube placed in your throat during surgery. If this causes discomfort, gargle with warm salt water. The discomfort should disappear within 24 hours.  If you had a scopolamine patch placed behind your ear for the management of post- operative nausea and/or vomiting:  1. The medication in the patch is effective for 72 hours, after which it should be removed.  Wrap patch in a tissue and discard in the trash. Wash hands thoroughly with soap and water. 2. You may remove the patch earlier than 72 hours if you experience unpleasant side effects which may include dry mouth, dizziness or visual disturbances. 3. Avoid touching the patch. Wash your hands with soap and water after contact with the patch.

## 2019-07-15 NOTE — Transfer of Care (Signed)
Immediate Anesthesia Transfer of Care Note  Patient: Kara [name]  Procedure(s) Performed: LEFT BREAST ONCOPLASTIC RECONSTRUCTION (Left Breast) RIGHT BREAST MASTOPEXY (Right Breast)  Patient Location: PACU  Anesthesia Type:General  Level of Consciousness: awake, alert , oriented and drowsy  Airway & Oxygen Therapy: Patient Spontanous Breathing and Patient connected to face mask oxygen  Post-op Assessment: Report given to RN and Post -op Vital signs reviewed and stable  Post vital signs: Reviewed and stable  Last Vitals:  Vitals Value Taken Time  BP    Temp    Pulse 82 07/15/19 0944  Resp 20 07/15/19 0944  SpO2 100 % 07/15/19 0944  Vitals shown include unvalidated device data.  Last Pain:  Vitals:   07/15/19 0630  TempSrc: Oral  PainSc: 0-No pain      Patients Stated Pain Goal: 0 ([id number])  Complications: No apparent anesthesia complications

## 2019-07-16 ENCOUNTER — Encounter (HOSPITAL_BASED_OUTPATIENT_CLINIC_OR_DEPARTMENT_OTHER): Payer: Self-pay | Admitting: Plastic Surgery

## 2019-07-16 LAB — SURGICAL PATHOLOGY

## 2019-07-24 ENCOUNTER — Other Ambulatory Visit: Payer: Self-pay

## 2019-07-24 ENCOUNTER — Ambulatory Visit
Admission: RE | Admit: 2019-07-24 | Discharge: 2019-07-24 | Disposition: A | Discharge status: Home / Self Care | Payer: 59 | Source: Ambulatory Visit | Attending: Radiation Oncology | Admitting: Radiation Oncology

## 2019-07-24 ENCOUNTER — Ambulatory Visit: Payer: 59 | Admitting: Radiation Oncology

## 2019-07-24 DIAGNOSIS — Z780 Asymptomatic menopausal state: Secondary | ICD-10-CM

## 2019-07-24 DIAGNOSIS — Z170 Estrogen receptor positive status [ER+]: Secondary | ICD-10-CM

## 2019-07-24 DIAGNOSIS — C50312 Malignant neoplasm of lower-inner quadrant of left female breast: Secondary | ICD-10-CM

## 2019-07-24 NOTE — Progress Notes (Addendum)
Radiation Oncology         [phone] ________________________________  Outpatient Follow Up- Conducted via telephone due to current COVID-19 concerns for limiting patient exposure  I spoke with the patient to conduct this consult visit via telephone to spare the patient unnecessary potential exposure in the healthcare setting during the current COVID-19 pandemic. The patient was notified in advance and was offered a [place] meeting to allow for face to face communication but unfortunately reported that they did not have the appropriate resources/technology to support such a visit and instead preferred to proceed with a telephone visit.  ________________________________  Name: Mardy Hoppe        MRN: [id number]  Date of Service: 07/24/2019 DOB: 05-Feb-1966  FM:[id number], Bill Salinas, MD  Magrinat, Virgie Dad, MD     REFERRING PHYSICIAN: Magrinat, Virgie Dad, MD   DIAGNOSIS: The encounter diagnosis was Malignant neoplasm of lower-inner quadrant of left breast in female, estrogen receptor positive ([place]).   HISTORY OF PRESENT ILLNESS: Sanjna La Ilhan Debenedetto is a 53 y.o. female with a recent diagnosis of left breast cancer. The patient was noted to have a screening detected group of calcifications in the left breast that on diagnostic imaging measured 4 x 4 x 3 cm, and her axilla was negative for adenopathy. 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 Since our last visit she underwent a left lumpectomy on 07/08/2019 that revealed a 2 mm area of DCIS that was low grade. Her margins were negative. She elected for oncoplastic breast reduction and this was performed on 07/15/2019. This revealed benign changes of bilateral specimens. She is contacted by phone to discuss treatment for her disease.    PREVIOUS RADIATION THERAPY: No   PAST MEDICAL HISTORY:  Past Medical History:  Diagnosis Date   Abnormal uterine bleeding (AUB)    Cancer  ([facility]) 04/2019   left breast DCIS   Carpal tunnel syndrome    Depression    Family history of breast cancer    HA (headache)    Hypertension    Migraines    OSA (obstructive sleep apnea)    Mild OSA AHI 9.3/hr now on CPAP at 6CM H2O   Sleep apnea    not used CPAP in over a month       PAST SURGICAL HISTORY: Past Surgical History:  Procedure Laterality Date   ABLATION     BREAST LUMPECTOMY WITH RADIOACTIVE SEED LOCALIZATION Left 07/08/2019   Procedure: LEFT BREAST LUMPECTOMY X 3  WITH RADIOACTIVE SEED LOCALIZATION;  Surgeon: Erroll Luna, MD;  Location: [facility];  Service: General;  Laterality: Left;   BREAST RECONSTRUCTION Left 07/15/2019   Procedure: LEFT BREAST ONCOPLASTIC RECONSTRUCTION;  Surgeon: Irene Limbo, MD;  Location: [facility];  Service: Plastics;  Laterality: Left;   CESAREAN SECTION     x2   DILATATION & CURETTAGE/HYSTEROSCOPY WITH MYOSURE N/A 06/04/2019   Procedure: DILATATION /HYSTEROSCOPY;  Surgeon: Janyth Pupa, DO;  Location: [facility];  Service: Gynecology;  Laterality: N/A;   LAPAROSCOPY N/A 06/04/2019   Procedure: LAPAROSCOPY Diagnostic, Repair Uterine Perforation;  Surgeon: Janyth Pupa, DO;  Location: [facility];  Service: Gynecology;  Laterality: N/A;   MASTOPEXY Right 07/15/2019   Procedure: RIGHT BREAST MASTOPEXY;  Surgeon: Irene Limbo, MD;  Location: [place] [facility];  Service: Plastics;  Laterality: Right;   tummy tuck       FAMILY HISTORY:  Family History  Problem Relation Age of Onset   Hypertension Mother    Breast cancer Sister 22   Breast cancer Maternal Aunt        dx 50s-60s   Lung cancer Maternal Uncle    Breast cancer Other        MGM's sister   Other Sister 32       MVA   Breast cancer Other        Mother's maternal cousins - x3     SOCIAL HISTORY:  reports that she has never smoked. She has never used smokeless tobacco.  She reports current alcohol use. She reports that she does not use drugs. The patient is married and works for a [employer].   ALLERGIES: Imitrex [sumatriptan], Hydrocil [psyllium], and Other   MEDICATIONS:  Current Outpatient Medications  Medication Sig Dispense Refill   buPROPion (WELLBUTRIN XL) 150 MG 24 hr tablet TAKE 1 TABLET BY MOUTH EVERY DAY IN THE MORNING     cyclobenzaprine (FLEXERIL) 10 MG tablet Take 10 mg by mouth 3 (three) times daily as needed for muscle spasms.     docusate sodium (COLACE) 100 MG capsule Take 1 capsule (100 mg total) by mouth 2 (two) times daily as needed for mild constipation. 30 capsule 0   hydrochlorothiazide (HYDRODIURIL) 25 MG tablet Take 25 mg by mouth daily.     ibuprofen (ADVIL) 600 MG tablet Take 1 tablet (600 mg total) by mouth every 6 (six) hours as needed. 30 tablet 0   ibuprofen (ADVIL) 800 MG tablet Take 1 tablet (800 mg total) by mouth every 8 (eight) hours as needed. 30 tablet 0   Multiple Vitamins-Minerals (HAIR SKIN AND NAILS FORMULA PO) Take as directed     naratriptan (AMERGE) 2.5 MG tablet Take 2.5 mg by mouth as needed for migraine. Take one (1) tablet at onset of headache; if returns or does not resolve, may repeat after 4 hours; do not exceed five (5) mg in 24 hours.     oxyCODONE (OXY IR/ROXICODONE) 5 MG immediate release tablet Take 1 tablet (5 mg total) by mouth every 6 (six) hours as needed for severe pain. 15 tablet 0   No current facility-administered medications for this encounter.      REVIEW OF SYSTEMS: On review of systems, the patient reports that she is doing well overall. She denies any chest pain, shortness of breath, cough, fevers, chills, night sweats, unintended weight changes. She denies any bowel or bladder disturbances, and denies abdominal pain, nausea or vomiting. She denies any new musculoskeletal or joint aches or pains, new skin lesions or concerns. A complete review of systems is  obtained and is otherwise negative.     PHYSICAL EXAM:  Wt Readings from Last 3 Encounters:  07/15/19 180 lb 12.4 oz (82 kg)  07/08/19 182 lb 12.2 oz (82.9 kg)  06/04/19 182 lb 5.1 oz (82.7 kg)   Unable to assess due to nature of visit.   ECOG = 1  0 - Asymptomatic (Fully active, able to carry on all predisease activities without restriction)  1 - Symptomatic but completely ambulatory (Restricted in physically strenuous activity but ambulatory and able to carry out work of a light or sedentary nature. For example, light housework, office work)  2 - Symptomatic, <50% in bed during the day (Ambulatory and capable of all self care but unable to carry out any work activities. Up and about more than 50% of waking hours)  3 - Symptomatic, >50%  in bed, but not bedbound (Capable of only limited self-care, confined to bed or chair 50% or more of waking hours)  4 - Bedbound (Completely disabled. Cannot carry on any self-care. Totally confined to bed or chair)  5 - Death   Eustace Pen MM, Creech RH, Tormey DC, et al. [id number]). "Toxicity and response criteria of the [facility]". [facility] Oncol. 5 (6): 649-55    LABORATORY DATA:  Lab Results  Component Value Date   WBC 3.7 (L) 05/29/2019   HGB 11.2 (L) 05/29/2019   HCT 33.5 (L) 05/29/2019   MCV 95.2 05/29/2019   PLT 342 05/29/2019   Lab Results  Component Value Date   NA 136 07/04/2019   K 3.7 07/04/2019   CL 100 07/04/2019   CO2 27 07/04/2019   Lab Results  Component Value Date   ALT 17 05/29/2019   AST 23 05/29/2019   ALKPHOS 47 05/29/2019   BILITOT 0.3 05/29/2019      RADIOGRAPHY: Mm Breast Surgical Specimen  Result Date: 07/08/2019 CLINICAL DATA:  Specimen radiograph status post left breast lumpectomy. EXAM: SPECIMEN RADIOGRAPH OF THE LEFT BREAST COMPARISON:  Previous exam(s). FINDINGS: Status post excision of the left breast. The 3 radioactive seeds and 3 biopsy marker clips are present and  completely intact. These findings were communicated with the OR at 11 a.m. IMPRESSION: Specimen radiograph of the left breast. Electronically Signed   By: Ammie Ferrier M.D.   On: 07/08/2019 11:03   Mm Lt Radioactive Seed Loc Mammo Guide  Result Date: 07/07/2019 CLINICAL DATA:  53 year old female presenting for radioactive seed localization of 3 sites in the left breast. EXAM: MAMMOGRAPHIC GUIDED RADIOACTIVE SEED LOCALIZATION OF THE LEFT BREAST COMPARISON:  Previous exam(s). FINDINGS: Patient presents for radioactive seed localization prior to left breast lumpectomy. I met with the patient and we discussed the procedure of seed localization including benefits and alternatives. We discussed the high likelihood of a successful procedure. We discussed the risks of the procedure including infection, bleeding, tissue injury and further surgery. We discussed the low dose of radioactivity involved in the procedure. Informed, written consent was given. The usual time-out protocol was performed immediately prior to the procedure. #1 Using mammographic guidance, sterile technique, 1% lidocaine and an I-125 radioactive seed, a site slightly medial of the X shaped biopsy marking clip was localized using a medial approach (as noted on the post biopsy mammogram from the stereotactic biopsy 06/09/2027, the X shaped biopsy marking clip was approximately 2 cm laterally displaced). The follow-up mammogram images confirm the seed in the expected location (approximately 1.3 cm medial to the X shaped biopsy marking clip) and were marked for Dr. Brantley Stage. Follow-up survey of the patient confirms presence of the radioactive seed. Order number of I-125 seed:  [id number]. Total activity:  7.169 millicuries reference Date: 06/03/2019 ---------------------------------------------- #2 Using mammographic guidance, sterile technique, 1% lidocaine and an I-125 radioactive seed, the ribbon shaped biopsy marking clip in the lower-inner left  breast was localized using a medial approach. The follow-up mammogram images confirm the seed in the expected location and were marked for Dr. Brantley Stage. Follow-up survey of the patient confirms presence of the radioactive seed. Order number of I-125 seed:  [id number]. Total activity:  7.510 millicuries reference Date: 06/03/2019 ---------------------------------------------- #3 Using mammographic guidance, sterile technique, 1% lidocaine and an I-125 radioactive seed, the coil shaped biopsy marking clip in the lower-inner quadrant of the left breast was localized using a medial approach. The follow-up mammogram images  confirm the seed in the expected location and were marked for Dr. Brantley Stage. Follow-up survey of the patient confirms presence of the radioactive seed. Order number of I-125 seed:  [id number]. Total activity:  7.416 millicuries reference Date: 06/30/2019 The patient tolerated the procedure well and was released from the [place]. She was given instructions regarding seed removal. IMPRESSION: Radioactive seed localization of 3 sites in the left breast breast. Note that the X shaped biopsy marking clip in the posterior central left breast was approximately 2 cm laterally displaced from the actual site of biopsy. The seed has been placed approximately 1.3 cm medial to the X shaped biopsy marking clip. No apparent complications. Electronically Signed   By: Ammie Ferrier M.D.   On: 07/07/2019 14:07   Mm Lt Rad Seed Ea Add Lesion Loc Mammo  Result Date: 07/07/2019 CLINICAL DATA:  53 year old female presenting for radioactive seed localization of 3 sites in the left breast. EXAM: MAMMOGRAPHIC GUIDED RADIOACTIVE SEED LOCALIZATION OF THE LEFT BREAST COMPARISON:  Previous exam(s). FINDINGS: Patient presents for radioactive seed localization prior to left breast lumpectomy. I met with the patient and we discussed the procedure of seed localization including benefits and alternatives. We discussed the high  likelihood of a successful procedure. We discussed the risks of the procedure including infection, bleeding, tissue injury and further surgery. We discussed the low dose of radioactivity involved in the procedure. Informed, written consent was given. The usual time-out protocol was performed immediately prior to the procedure. #1 Using mammographic guidance, sterile technique, 1% lidocaine and an I-125 radioactive seed, a site slightly medial of the X shaped biopsy marking clip was localized using a medial approach (as noted on the post biopsy mammogram from the stereotactic biopsy 06/09/2027, the X shaped biopsy marking clip was approximately 2 cm laterally displaced). The follow-up mammogram images confirm the seed in the expected location (approximately 1.3 cm medial to the X shaped biopsy marking clip) and were marked for Dr. Brantley Stage. Follow-up survey of the patient confirms presence of the radioactive seed. Order number of I-125 seed:  [id number]. Total activity:  0.321 millicuries reference Date: 06/03/2019 ---------------------------------------------- #2 Using mammographic guidance, sterile technique, 1% lidocaine and an I-125 radioactive seed, the ribbon shaped biopsy marking clip in the lower-inner left breast was localized using a medial approach. The follow-up mammogram images confirm the seed in the expected location and were marked for Dr. Brantley Stage. Follow-up survey of the patient confirms presence of the radioactive seed. Order number of I-125 seed:  [id number]. Total activity:  7.048 millicuries reference Date: 06/03/2019 ---------------------------------------------- #3 Using mammographic guidance, sterile technique, 1% lidocaine and an I-125 radioactive seed, the coil shaped biopsy marking clip in the lower-inner quadrant of the left breast was localized using a medial approach. The follow-up mammogram images confirm the seed in the expected location and were marked for Dr. Brantley Stage. Follow-up survey  of the patient confirms presence of the radioactive seed. Order number of I-125 seed:  [id number]. Total activity:  3.888 millicuries reference Date: 06/30/2019 The patient tolerated the procedure well and was released from the [place]. She was given instructions regarding seed removal. IMPRESSION: Radioactive seed localization of 3 sites in the left breast breast. Note that the X shaped biopsy marking clip in the posterior central left breast was approximately 2 cm laterally displaced from the actual site of biopsy. The seed has been placed approximately 1.3 cm medial to the X shaped biopsy marking clip. No apparent complications. Electronically Signed   By: Sharyn Lull  Theda Sers M.D.   On: 07/07/2019 14:07   Mm Lt Rad Seed Ea Add Lesion Loc Mammo  Result Date: 07/07/2019 CLINICAL DATA:  53 year old female presenting for radioactive seed localization of 3 sites in the left breast. EXAM: MAMMOGRAPHIC GUIDED RADIOACTIVE SEED LOCALIZATION OF THE LEFT BREAST COMPARISON:  Previous exam(s). FINDINGS: Patient presents for radioactive seed localization prior to left breast lumpectomy. I met with the patient and we discussed the procedure of seed localization including benefits and alternatives. We discussed the high likelihood of a successful procedure. We discussed the risks of the procedure including infection, bleeding, tissue injury and further surgery. We discussed the low dose of radioactivity involved in the procedure. Informed, written consent was given. The usual time-out protocol was performed immediately prior to the procedure. #1 Using mammographic guidance, sterile technique, 1% lidocaine and an I-125 radioactive seed, a site slightly medial of the X shaped biopsy marking clip was localized using a medial approach (as noted on the post biopsy mammogram from the stereotactic biopsy 06/09/2027, the X shaped biopsy marking clip was approximately 2 cm laterally displaced). The follow-up mammogram images  confirm the seed in the expected location (approximately 1.3 cm medial to the X shaped biopsy marking clip) and were marked for Dr. Brantley Stage. Follow-up survey of the patient confirms presence of the radioactive seed. Order number of I-125 seed:  [id number]. Total activity:  7.209 millicuries reference Date: 06/03/2019 ---------------------------------------------- #2 Using mammographic guidance, sterile technique, 1% lidocaine and an I-125 radioactive seed, the ribbon shaped biopsy marking clip in the lower-inner left breast was localized using a medial approach. The follow-up mammogram images confirm the seed in the expected location and were marked for Dr. Brantley Stage. Follow-up survey of the patient confirms presence of the radioactive seed. Order number of I-125 seed:  [id number]. Total activity:  6.294 millicuries reference Date: 06/03/2019 ---------------------------------------------- #3 Using mammographic guidance, sterile technique, 1% lidocaine and an I-125 radioactive seed, the coil shaped biopsy marking clip in the lower-inner quadrant of the left breast was localized using a medial approach. The follow-up mammogram images confirm the seed in the expected location and were marked for Dr. Brantley Stage. Follow-up survey of the patient confirms presence of the radioactive seed. Order number of I-125 seed:  [id number]. Total activity:  4.656 millicuries reference Date: 06/30/2019 The patient tolerated the procedure well and was released from the [place]. She was given instructions regarding seed removal. IMPRESSION: Radioactive seed localization of 3 sites in the left breast breast. Note that the X shaped biopsy marking clip in the posterior central left breast was approximately 2 cm laterally displaced from the actual site of biopsy. The seed has been placed approximately 1.3 cm medial to the X shaped biopsy marking clip. No apparent complications. Electronically Signed   By: Ammie Ferrier M.D.   On:  07/07/2019 14:07       IMPRESSION/PLAN: 1. Low Grade, ER/PR positive DCIS of the left breast. Dr. Lisbeth Renshaw discusses the final results from pathology findings and reviews the nature of non invasive left breast disease. She is healing well it sounds like but since she had oncoplastic reduction, we will hold off on simulation which was originally scheduled this week. We discussed the risks, benefits, short, and long term effects of radiotherapy, and the patient is interested in proceeding. Dr. Lisbeth Renshaw discusses the delivery and logistics of radiotherapy and anticipates a course of 6 1/2 weeks of radiotherapy once she's fully healed with deep inspiration breath hold technique. We rescheduled her simulation to 08/07/19.  At that time she will sign consent to proceed as well.  2. Menopause. The patient has had previous endometrial ablation and reports she is menopausal from blood work done by her GYN. We will not need urine hcg given this. She is in agreement.    Given current concerns for patient exposure during the COVID-19 pandemic, this encounter was conducted via telephone.  The patient has given verbal consent for this type of encounter. The time spent during this encounter was 25 minutes and 50% of that time was spent in the coordination of her care. The attendants for this meeting included Dr. Lisbeth Renshaw,  Shona Simpson, [facility] and Malvin Johns  During the encounter, Dr. Lisbeth Renshaw and Shona Simpson [facility] were located at [facility] Radiation Oncology Department.  Norena Bratton  was located at home.   The above documentation reflects my direct findings during this shared patient visit. Please see the separate note by Dr. Lisbeth Renshaw on this date for the remainder of the patient's plan of care.    Carola Rhine, PAC

## 2019-07-28 ENCOUNTER — Ambulatory Visit: Payer: 59 | Admitting: Adult Health

## 2019-07-30 ENCOUNTER — Other Ambulatory Visit: Payer: Self-pay

## 2019-07-30 ENCOUNTER — Inpatient Hospital Stay (HOSPITAL_BASED_OUTPATIENT_CLINIC_OR_DEPARTMENT_OTHER): Payer: No Typology Code available for payment source | Admitting: Adult Health

## 2019-07-30 VITALS — BP 137/89 | HR 64 | Temp 98.9°F | Resp 18 | Ht 64.0 in | Wt 178.4 lb

## 2019-07-30 DIAGNOSIS — Z170 Estrogen receptor positive status [ER+]: Secondary | ICD-10-CM

## 2019-07-30 DIAGNOSIS — C50312 Malignant neoplasm of lower-inner quadrant of left female breast: Secondary | ICD-10-CM

## 2019-07-30 DIAGNOSIS — Z510 Encounter for antineoplastic radiation therapy: Secondary | ICD-10-CM | POA: Diagnosis not present

## 2019-07-30 NOTE — Progress Notes (Signed)
[place]  [phone] [phone]     ID: Kara [name] DOB: [birth year]  MR#: [id number]  BZM#:[id number]  Patient Care Team: Aretta Nip, MD as PCP - General (Family Medicine) Sueanne Margarita, MD as Consulting Physician (Cardiology) Irene Limbo, MD as Consulting Physician (Plastic Surgery) Erroll Luna, MD as Consulting Physician (General Surgery) Magrinat, Virgie Dad, MD as Consulting Physician (Oncology) Kyung Rudd, MD as Consulting Physician (Radiation Oncology) Janyth Pupa, DO as Consulting Physician (Obstetrics and Gynecology) Scot Dock, NP OTHER MD:  CHIEF COMPLAINT: Estrogen receptor positive ductal carcinoma in situ  CURRENT TREATMENT: adjuvant radiation pending  HISTORY OF CURRENT ILLNESS: Kara [name] had routine screening mammography on 04/11/2019 showing a possible abnormality in the left breast. She underwent bilateral diagnostic mammography with tomography at [facility] on 04/17/2019 showing: breast density category C; indeterminate 4 cm calcifications in the lower-inner quadrant of the left breast.   Accordingly on 04/23/2019 she proceeded to biopsy of the left breast area in question. The pathology from this procedure ([id number]) showed: ductal carcinoma in situ with calcifications, low grade; fibrocystic and fibroadenomatoid change.  Prognostic indicators significant for: estrogen receptor, 90% positive with strong staining intensity and progesterone receptor, 60% positive with moderate staining intensity.   The patient's subsequent history is as detailed below.   INTERVAL HISTORY: Kara [name] is here today for follow up of her estrogen positive breast cancer.  Since her last visit with [place], she underwent left breast lumpectomy showing a 0.2cm area of DCIS, margins negative.  She also underwent bilateral mammoplasty at that time, and notes she is healing well.  She is happy with the  surgical results from the procedures.    REVIEW OF SYSTEMS: Kara [name] is recovering well from surgery.  She has no current issues such as fever, chills, chest pain, palpitations, cough, nausea, vomiting, bowel/bladder changes, or any other concerns.  She is slowly increasing her activity level and is walking regularly.  A detailed ROS was otherwise non contributory.    PAST MEDICAL HISTORY: Past Medical History:  Diagnosis Date   Abnormal uterine bleeding (AUB)    Cancer ([place]) 04/2019   left breast DCIS   Carpal tunnel syndrome    Depression    Family history of breast cancer    HA (headache)    Hypertension    Migraines    OSA (obstructive sleep apnea)    Mild OSA AHI 9.3/hr now on CPAP at 6CM H2O   Sleep apnea    not used CPAP in over a month    PAST SURGICAL HISTORY: Past Surgical History:  Procedure Laterality Date   ABLATION     BREAST LUMPECTOMY WITH RADIOACTIVE SEED LOCALIZATION Left 07/08/2019   Procedure: LEFT BREAST LUMPECTOMY X 3  WITH RADIOACTIVE SEED LOCALIZATION;  Surgeon: Erroll Luna, MD;  Location: [facility];  Service: General;  Laterality: Left;   BREAST RECONSTRUCTION Left 07/15/2019   Procedure: LEFT BREAST ONCOPLASTIC RECONSTRUCTION;  Surgeon: Irene Limbo, MD;  Location: [facility];  Service: Plastics;  Laterality: Left;   CESAREAN SECTION     x2   DILATATION & CURETTAGE/HYSTEROSCOPY WITH MYOSURE N/A 06/04/2019   Procedure: DILATATION /HYSTEROSCOPY;  Surgeon: Janyth Pupa, DO;  Location: [facility];  Service: Gynecology;  Laterality: N/A;   LAPAROSCOPY N/A 06/04/2019   Procedure: LAPAROSCOPY Diagnostic, Repair Uterine Perforation;  Surgeon: Janyth Pupa, DO;  Location: [facility];  Service: Gynecology;  Laterality: N/A;  MASTOPEXY Right 07/15/2019   Procedure: RIGHT BREAST MASTOPEXY;  Surgeon: Irene Limbo, MD;  Location: [facility];  Service: Plastics;   Laterality: Right;   tummy tuck    right ankle surgery as a kid  FAMILY HISTORY: Family History  Problem Relation Age of Onset   Hypertension Mother    Breast cancer Sister 58   Breast cancer Maternal Aunt        dx 50s-60s   Lung cancer Maternal Uncle    Breast cancer Other        MGM's sister   Other Sister 31       MVA   Breast cancer Other        Mother's maternal cousins - x3   Patient's father is living at age 71-80. Patient's mother is also living at age 23. (as of 05/2019) The patient's maternal half-sister was diagnosed with breast cancer at age 35. Another half-sister had breast cancer before the age of 75 and has since passed away.  The patient has 2 half siblings on her mother's side and 3 half siblings on her father's. The patient denies a family hx of ovarian, prostate or pancreatic cancer.   GYNECOLOGIC HISTORY:  No LMP recorded. Patient has had an ablation. Menarche: 53 years old Age at first live birth: 53 years old [place] P 2 LMP she is currently spotting Contraceptive: used for ~25 years with no problems HRT n/a  Hysterectomy? no BSO? 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 She is married but separated. Husband [name] is a Barrister's clerk. They have not completed any separation paperwork at this time and she describes him as friendly and supportive. She lives at home with her youngest son [name], age 25, a Ship broker.  Older son [name], age 57, lives in [place] as a Ship broker of [employer] in liberal arts.  The patient attends [employer].    ADVANCED DIRECTIVES: Kara [name] is comfortable with her husband [name] as her HCPOA   HEALTH MAINTENANCE: Social History   Tobacco Use   Smoking status: Never Smoker   Smokeless tobacco: Never Used  Substance Use Topics   Alcohol use: Yes    Alcohol/week: 0.0 standard drinks    Comment: social    Drug use: No     Colonoscopy: states she is due for repeat  PAP: Due  Bone density: never done   Allergies  Allergen Reactions   Imitrex [Sumatriptan] Other (See Comments)    Nauseated - Feel like passing out - lack of therapeutic effect.   Hydrocil [Psyllium] Nausea And Vomiting   Other     "some kind of pain medicine a long time ago made me feel nauseous"     Current Outpatient Medications  Medication Sig Dispense Refill   buPROPion (WELLBUTRIN XL) 150 MG 24 hr tablet TAKE 1 TABLET BY MOUTH EVERY DAY IN THE MORNING     clindamycin (CLEOCIN T) 1 % lotion APPLY A SMALL AMOUNT TO SKIN TWICE A DAY AS NEEDED     hydrochlorothiazide (HYDRODIURIL) 25 MG tablet Take 25 mg by mouth daily.     hydroquinone 4 % cream APPLY A SMALL AMOUNT TOPICALLY EVERY DAY TO THE SKIN     ibuprofen (ADVIL) 600 MG tablet Take 1 tablet (600 mg total) by mouth every 6 (six) hours as needed. 30 tablet 0   ibuprofen (ADVIL) 800  MG tablet Take 1 tablet (800 mg total) by mouth every 8 (eight) hours as needed. 30 tablet 0   Multiple Vitamins-Minerals (HAIR SKIN AND NAILS FORMULA PO) Take as directed     naproxen (NAPROSYN) 500 MG tablet TAKE 1 TABLET BY MOUTH EVERY 12 HOURS AS NEEDED FOR PAIN 30 DAYS     No current facility-administered medications for this visit.     OBJECTIVE: Middle-aged African-American woman who appears stated age  110:   07/30/19 1317  BP: 137/89  Pulse: 64  Resp: 18  Temp: 98.9 F (37.2 C)  SpO2: 100%     Body mass index is 30.62 kg/m.   Wt Readings from Last 3 Encounters:  07/30/19 178 lb 6.4 oz (80.9 kg)  07/15/19 180 lb 12.4 oz (82 kg)  07/08/19 182 lb 12.2 oz (82.9 kg)      ECOG FS:1 - Symptomatic but completely ambulatory GENERAL: Patient is a well appearing female in no acute distress HEENT:  Sclerae anicteric.  Oropharynx clear and moist. No ulcerations or evidence of oropharyngeal candidiasis. Neck is supple.  NODES:  No cervical, supraclavicular, or  axillary lymphadenopathy palpated.  BREAST EXAM:  S/p bilateral mammoplasty, healing well, benign LUNGS:  Clear to auscultation bilaterally.  No wheezes or rhonchi. HEART:  Regular rate and rhythm. No murmur appreciated. ABDOMEN:  Soft, nontender.  Positive, normoactive bowel sounds. No organomegaly palpated. MSK:  No focal spinal tenderness to palpation. EXTREMITIES:  No peripheral edema.   SKIN:  Clear with no obvious rashes or skin changes. No nail dyscrasia. NEURO:  Nonfocal. Well oriented.  Appropriate affect.    LAB RESULTS:  CMP     Component Value Date/Time   NA 136 07/04/2019 1400   NA 138 11/02/2014 0932   K 3.7 07/04/2019 1400   CL 100 07/04/2019 1400   CO2 27 07/04/2019 1400   GLUCOSE 93 07/04/2019 1400   BUN 12 07/04/2019 1400   BUN 16 11/02/2014 0932   CREATININE 0.94 07/04/2019 1400   CALCIUM 9.4 07/04/2019 1400   PROT 6.9 05/29/2019 0930   ALBUMIN 3.4 (L) 05/29/2019 0930   AST 23 05/29/2019 0930   ALT 17 05/29/2019 0930   ALKPHOS 47 05/29/2019 0930   BILITOT 0.3 05/29/2019 0930   GFRNONAA >60 07/04/2019 1400   GFRAA >60 07/04/2019 1400    No results found for: TOTALPROTELP, ALBUMINELP, A1GS, A2GS, BETS, BETA2SER, GAMS, MSPIKE, SPEI  No results found for: KPAFRELGTCHN, LAMBDASER, KAPLAMBRATIO  Lab Results  Component Value Date   WBC 3.7 (L) 05/29/2019   HGB 11.2 (L) 05/29/2019   HCT 33.5 (L) 05/29/2019   MCV 95.2 05/29/2019   PLT 342 05/29/2019    _0 @  No results found for: LABCA2  No components found for: [id number]  No results for input(s): INR in the last 168 hours.  No results found for: LABCA2  No results found for: YYT035  No results found for: WSF681  No results found for: EXN170  No results found for: CA2729  No components found for: HGQUANT  No results found for: CEA1 / No results found for: CEA1   No results found for: AFPTUMOR  No results found for: CHROMOGRNA  No results found for: PSA1  No visits with  results within 3 Day(s) from this visit.  Latest known visit with results is:  Admission on 07/15/2019, Discharged on 07/15/2019  Component Date Value Ref Range Status   SURGICAL PATHOLOGY 07/15/2019    Final-Edited  Value:SURGICAL PATHOLOGY CASE: [id number] PATIENT: Kara [name] Surgical Pathology Report     Clinical History: Left breast Ductal Carcinoma In Situ.     DIAGNOSIS:  A. BREAST, LEFT, MAMMOPLASTY: - Benign skin.  B. BREAST, RIGHT, MAMMOPLASTY: - Benign skin and breast tissue.   GROSS DESCRIPTION:  A.  Received fresh and placed in formalin is a 8 g (8.4 g clinically), 6 x 4 x 1 cm aggregate of smooth to wrinkled dark brown skin.  Discrete lesions are not grossly seen.  Discrete breast tissue is not seen. Representative sections are submitted in 2 cassettes.  B.  Received fresh and placed in formalin is a 23 g (19.2 g clinically), 5.5 x 5 x 3 cm area dark brown skin and tan-yellow tissue.  Skin is smooth to wrinkled and free of lesions.  Soft tissue sections are predominantly fatty.  Discrete lesions not seen.  Representative sections are submitted in 2 cassettes.    Final Diagnosis performed by Vicente Males, MD.   Electronically signed 07/16/2019 Kristeen Mans                         cal component performed at [facility]. [facility], [place] [address], [place], [place] [address].  Professional component performed at [facility], [place] [address], [place], [place] [address].  Immunohistochemistry Technical component (if applicable) was performed at [facility]. [address], [place], [place], [place] [address].   IMMUNOHISTOCHEMISTRY DISCLAIMER (if applicable): Some of these immunohistochemical stains may have been developed and the performance characteristics determine by [facility]. Some may not have been cleared or approved by the U.S. Food and Drug Administration. The FDA has  determined that such clearance or approval is not necessary. This test is used for clinical purposes. It should not be regarded as investigational or for research. This laboratory is certified under the [place] (CLIA-88) as qualified to perform high complexity clinical laborator                         y testing.  The controls stained appropriately.     (this displays the last labs from the last 3 days)  No results found for: TOTALPROTELP, ALBUMINELP, A1GS, A2GS, BETS, BETA2SER, GAMS, MSPIKE, SPEI (this displays SPEP labs)  No results found for: KPAFRELGTCHN, LAMBDASER, KAPLAMBRATIO (kappa/lambda light chains)  No results found for: HGBA, HGBA2QUANT, HGBFQUANT, HGBSQUAN (Hemoglobinopathy evaluation)   No results found for: LDH  No results found for: IRON, TIBC, IRONPCTSAT (Iron and TIBC)  No results found for: FERRITIN  Urinalysis No results found for: COLORURINE, APPEARANCEUR, LABSPEC, PHURINE, GLUCOSEU, HGBUR, BILIRUBINUR, KETONESUR, PROTEINUR, UROBILINOGEN, NITRITE, LEUKOCYTESUR   STUDIES: Mm Breast Surgical Specimen  Result Date: 07/08/2019 CLINICAL DATA:  Specimen radiograph status post left breast lumpectomy. EXAM: SPECIMEN RADIOGRAPH OF THE LEFT BREAST COMPARISON:  Previous exam(s). FINDINGS: Status post excision of the left breast. The 3 radioactive seeds and 3 biopsy marker clips are present and completely intact. These findings were communicated with the OR at 11 a.m. IMPRESSION: Specimen radiograph of the left breast. Electronically Signed   By: Ammie Ferrier M.D.   On: 07/08/2019 11:03   Mm Lt Radioactive Seed Loc Mammo Guide  Result Date: 07/07/2019 CLINICAL DATA:  53 year old female presenting for radioactive seed localization of 3 sites in the left breast. EXAM: MAMMOGRAPHIC GUIDED RADIOACTIVE SEED LOCALIZATION OF THE LEFT BREAST COMPARISON:  Previous exam(s). FINDINGS:  Patient presents for radioactive seed  localization prior to left breast lumpectomy. I met with the patient and we discussed the procedure of seed localization including benefits and alternatives. We discussed the high likelihood of a successful procedure. We discussed the risks of the procedure including infection, bleeding, tissue injury and further surgery. We discussed the low dose of radioactivity involved in the procedure. Informed, written consent was given. The usual time-out protocol was performed immediately prior to the procedure. #1 Using mammographic guidance, sterile technique, 1% lidocaine and an I-125 radioactive seed, a site slightly medial of the X shaped biopsy marking clip was localized using a medial approach (as noted on the post biopsy mammogram from the stereotactic biopsy 06/09/2027, the X shaped biopsy marking clip was approximately 2 cm laterally displaced). The follow-up mammogram images confirm the seed in the expected location (approximately 1.3 cm medial to the X shaped biopsy marking clip) and were marked for Dr. Brantley Stage. Follow-up survey of the patient confirms presence of the radioactive seed. Order number of I-125 seed:  [id number]. Total activity:  9.147 millicuries reference Date: 06/03/2019 ---------------------------------------------- #2 Using mammographic guidance, sterile technique, 1% lidocaine and an I-125 radioactive seed, the ribbon shaped biopsy marking clip in the lower-inner left breast was localized using a medial approach. The follow-up mammogram images confirm the seed in the expected location and were marked for Dr. Brantley Stage. Follow-up survey of the patient confirms presence of the radioactive seed. Order number of I-125 seed:  [id number]. Total activity:  8.657 millicuries reference Date: 06/03/2019 ---------------------------------------------- #3 Using mammographic guidance, sterile technique, 1% lidocaine and an I-125 radioactive seed, the coil shaped biopsy marking clip in the lower-inner quadrant of  the left breast was localized using a medial approach. The follow-up mammogram images confirm the seed in the expected location and were marked for Dr. Brantley Stage. Follow-up survey of the patient confirms presence of the radioactive seed. Order number of I-125 seed:  [id number]. Total activity:  8.413 millicuries reference Date: 06/30/2019 The patient tolerated the procedure well and was released from the [facility]. She was given instructions regarding seed removal. IMPRESSION: Radioactive seed localization of 3 sites in the left breast breast. Note that the X shaped biopsy marking clip in the posterior central left breast was approximately 2 cm laterally displaced from the actual site of biopsy. The seed has been placed approximately 1.3 cm medial to the X shaped biopsy marking clip. No apparent complications. Electronically Signed   By: Ammie Ferrier M.D.   On: 07/07/2019 14:07   Mm Lt Rad Seed Ea Add Lesion Loc Mammo  Result Date: 07/07/2019 CLINICAL DATA:  53 year old female presenting for radioactive seed localization of 3 sites in the left breast. EXAM: MAMMOGRAPHIC GUIDED RADIOACTIVE SEED LOCALIZATION OF THE LEFT BREAST COMPARISON:  Previous exam(s). FINDINGS: Patient presents for radioactive seed localization prior to left breast lumpectomy. I met with the patient and we discussed the procedure of seed localization including benefits and alternatives. We discussed the high likelihood of a successful procedure. We discussed the risks of the procedure including infection, bleeding, tissue injury and further surgery. We discussed the low dose of radioactivity involved in the procedure. Informed, written consent was given. The usual time-out protocol was performed immediately prior to the procedure. #1 Using mammographic guidance, sterile technique, 1% lidocaine and an I-125 radioactive seed, a site slightly medial of the X shaped biopsy marking clip was localized using a medial approach (as noted on the  post biopsy mammogram from the stereotactic biopsy 06/09/2027,  the X shaped biopsy marking clip was approximately 2 cm laterally displaced). The follow-up mammogram images confirm the seed in the expected location (approximately 1.3 cm medial to the X shaped biopsy marking clip) and were marked for Dr. Brantley Stage. Follow-up survey of the patient confirms presence of the radioactive seed. Order number of I-125 seed:  [id number]. Total activity:  1.517 millicuries reference Date: 06/03/2019 ---------------------------------------------- #2 Using mammographic guidance, sterile technique, 1% lidocaine and an I-125 radioactive seed, the ribbon shaped biopsy marking clip in the lower-inner left breast was localized using a medial approach. The follow-up mammogram images confirm the seed in the expected location and were marked for Dr. Brantley Stage. Follow-up survey of the patient confirms presence of the radioactive seed. Order number of I-125 seed:  [id number]. Total activity:  6.269 millicuries reference Date: 06/03/2019 ---------------------------------------------- #3 Using mammographic guidance, sterile technique, 1% lidocaine and an I-125 radioactive seed, the coil shaped biopsy marking clip in the lower-inner quadrant of the left breast was localized using a medial approach. The follow-up mammogram images confirm the seed in the expected location and were marked for Dr. Brantley Stage. Follow-up survey of the patient confirms presence of the radioactive seed. Order number of I-125 seed:  [id number]. Total activity:  5.009 millicuries reference Date: 06/30/2019 The patient tolerated the procedure well and was released from the [facility]. She was given instructions regarding seed removal. IMPRESSION: Radioactive seed localization of 3 sites in the left breast breast. Note that the X shaped biopsy marking clip in the posterior central left breast was approximately 2 cm laterally displaced from the actual site of biopsy. The seed  has been placed approximately 1.3 cm medial to the X shaped biopsy marking clip. No apparent complications. Electronically Signed   By: Ammie Ferrier M.D.   On: 07/07/2019 14:07   Mm Lt Rad Seed Ea Add Lesion Loc Mammo  Result Date: 07/07/2019 CLINICAL DATA:  53 year old female presenting for radioactive seed localization of 3 sites in the left breast. EXAM: MAMMOGRAPHIC GUIDED RADIOACTIVE SEED LOCALIZATION OF THE LEFT BREAST COMPARISON:  Previous exam(s). FINDINGS: Patient presents for radioactive seed localization prior to left breast lumpectomy. I met with the patient and we discussed the procedure of seed localization including benefits and alternatives. We discussed the high likelihood of a successful procedure. We discussed the risks of the procedure including infection, bleeding, tissue injury and further surgery. We discussed the low dose of radioactivity involved in the procedure. Informed, written consent was given. The usual time-out protocol was performed immediately prior to the procedure. #1 Using mammographic guidance, sterile technique, 1% lidocaine and an I-125 radioactive seed, a site slightly medial of the X shaped biopsy marking clip was localized using a medial approach (as noted on the post biopsy mammogram from the stereotactic biopsy 06/09/2027, the X shaped biopsy marking clip was approximately 2 cm laterally displaced). The follow-up mammogram images confirm the seed in the expected location (approximately 1.3 cm medial to the X shaped biopsy marking clip) and were marked for Dr. Brantley Stage. Follow-up survey of the patient confirms presence of the radioactive seed. Order number of I-125 seed:  [id number]. Total activity:  1.696 millicuries reference Date: 06/03/2019 ---------------------------------------------- #2 Using mammographic guidance, sterile technique, 1% lidocaine and an I-125 radioactive seed, the ribbon shaped biopsy marking clip in the lower-inner left breast was localized  using a medial approach. The follow-up mammogram images confirm the seed in the expected location and were marked for Dr. Brantley Stage. Follow-up survey of the patient confirms presence of  the radioactive seed. Order number of I-125 seed:  [id number]. Total activity:  5.361 millicuries reference Date: 06/03/2019 ---------------------------------------------- #3 Using mammographic guidance, sterile technique, 1% lidocaine and an I-125 radioactive seed, the coil shaped biopsy marking clip in the lower-inner quadrant of the left breast was localized using a medial approach. The follow-up mammogram images confirm the seed in the expected location and were marked for Dr. Brantley Stage. Follow-up survey of the patient confirms presence of the radioactive seed. Order number of I-125 seed:  [id number]. Total activity:  6.761 millicuries reference Date: 06/30/2019 The patient tolerated the procedure well and was released from the [facility]. She was given instructions regarding seed removal. IMPRESSION: Radioactive seed localization of 3 sites in the left breast breast. Note that the X shaped biopsy marking clip in the posterior central left breast was approximately 2 cm laterally displaced from the actual site of biopsy. The seed has been placed approximately 1.3 cm medial to the X shaped biopsy marking clip. No apparent complications. Electronically Signed   By: Ammie Ferrier M.D.   On: 07/07/2019 14:07    ELIGIBLE FOR AVAILABLE RESEARCH PROTOCOL: opted against COMET  ASSESSMENT: 53 y.o.  [place] woman status post left breast biopsy 04/23/2023 ductal carcinoma in situ, low-grade, estrogen and progesterone receptor positive  (1) s/p lumpectomy on 07/08/2019 shows 0.2cm DCIS, margins negative  (a) s/p bilateral mammoplasty on 07/15/2019  (2) adjuvant radiation to start 08/07/2019  (3) consider prophylactic antiestrogens  (4) genetics testing pending  PLAN: Kara [name] and I talked about her plan.  She has tolerated her  surgery well and is pleased with her surgical and cosmetic results.  I gave her a copy of her pathology results.  She is now due to meet with radiation oncology and this is scheduled on 08/07/2019.  We reviewed the goals of radiation therapy and she understands this.    We also reviewed anti estrogen therapy in detail.  We discussed Tamoxifen versus anastrozole.  She still has her uterus and ovaries, however had an endometrial ablation several years ago, and her cycles have since stopped.  I placed orders for her to have an estradiol, fsh and lh drawn on 10/22 when she returns for her CT simulation for radiation.  Also, I placed orders for bone density testing to help aid in the discussion for anti estrogen therapy.  I gave her detailed information in her AVS about both medications.    Simon will return in 09/2019 to meet with Dr. Jana Hakim and review anti estrogen therapy.  In the meantime, she will undergo her radiation, and I encouraged her to exercise and eat a well balanced diet.  She was recommended to continue with the appropriate pandemic precautions. She knows to call for any questions that may arise between now and her next appointment.  We are happy to see her sooner if needed.  A total of (30) minutes of face-to-face time was spent with this patient with greater than 50% of that time in counseling and care-coordination.   Wilber Bihari, NP   07/30/2019 1:59 PM Medical Oncology and H[facility]ology [facility] [address] [place], [place] [address] Tel. [phone]    Fax. [phone]

## 2019-07-30 NOTE — Patient Instructions (Signed)
Tamoxifen oral tablet What is this medicine? TAMOXIFEN (ta MOX i fen) blocks the effects of estrogen. It is commonly used to treat breast cancer. It is also used to decrease the chance of breast cancer coming back in women who have received treatment for the disease. It may also help prevent breast cancer in women who have a high risk of developing breast cancer. This medicine may be used for other purposes; ask your health care provider or pharmacist if you have questions. COMMON BRAND NAME(S): Nolvadex What should I tell my health care provider before I take this medicine? They need to know if you have any of these conditions:  blood clots  blood disease  cataracts or impaired eyesight  endometriosis  high calcium levels  high cholesterol  irregular menstrual cycles  liver disease  stroke  uterine fibroids  an unusual reaction to tamoxifen, other medicines, foods, dyes, or preservatives  pregnant or trying to get pregnant  breast-feeding How should I use this medicine? Take this medicine by mouth with a glass of water. Follow the directions on the prescription label. You can take it with or without food. Take your medicine at regular intervals. Do not take your medicine more often than directed. Do not stop taking except on your doctor's advice. A special MedGuide will be given to you by the pharmacist with each prescription and refill. Be sure to read this information carefully each time. Talk to your pediatrician regarding the use of this medicine in children. While this drug may be prescribed for selected conditions, precautions do apply. Overdosage: If you think you have taken too much of this medicine contact a poison control center or emergency room at once. NOTE: This medicine is only for you. Do not share this medicine with others. What if I miss a dose? If you miss a dose, take it as soon as you can. If it is almost time for your next dose, take only that dose. Do  not take double or extra doses. What may interact with this medicine? Do not take this medicine with any of the following medications:  cisapride  certain medicines for irregular heart beat like dronedarone, quinidine  certain medicines for fungal infection like fluconazole, posaconazole  pimozide  saquinavir  thioridazine This medicine may also interact with the following medications:  aminoglutethimide  anastrozole  bromocriptine  chemotherapy drugs  dofetilide  female hormones, like estrogens and birth control pills  letrozole  medroxyprogesterone  phenobarbital  rifampin  warfarin This list may not describe all possible interactions. Give your health care provider a list of all the medicines, herbs, non-prescription drugs, or dietary supplements you use. Also tell them if you smoke, drink alcohol, or use illegal drugs. Some items may interact with your medicine. What should I watch for while using this medicine? Visit your doctor or health care professional for regular checks on your progress. You will need regular pelvic exams, breast exams, and mammograms. If you are taking this medicine to reduce your risk of getting breast cancer, you should know that this medicine does not prevent all types of breast cancer. If breast cancer or other problems occur, there is no guarantee that it will be found at an early stage. Do not become pregnant while taking this medicine or for 2 months after stopping it. Women should inform their doctor if they wish to become pregnant or think they might be pregnant. There is a potential for serious side effects to an unborn child. Talk to your  health care professional or pharmacist for more information. Do not breast-feed an infant while taking this medicine or for 3 months after stopping it. This medicine may interfere with the ability to have a child. Talk with your doctor or health care professional if you are concerned about your  fertility. What side effects may I notice from receiving this medicine? Side effects that you should report to your doctor or health care professional as soon as possible:  allergic reactions like skin rash, itching or hives, swelling of the face, lips, or tongue  changes in vision  changes in your menstrual cycle  difficulty walking or talking  new breast lumps  numbness  pelvic pain or pressure  redness, blistering, peeling or loosening of the skin, including inside the mouth  signs and symptoms of a dangerous change in heartbeat or heart rhythm like chest pain, dizziness, fast or irregular heartbeat, palpitations, feeling faint or lightheaded, falls, breathing problems  sudden chest pain  swelling, pain or tenderness in your calf or leg  unusual bruising or bleeding  vaginal discharge that is bloody, brown, or rust  weakness  yellowing of the whites of the eyes or skin Side effects that usually do not require medical attention (report to your doctor or health care professional if they continue or are bothersome):  fatigue  hair loss, although uncommon and is usually mild  headache  hot flashes  impotence (in men)  nausea, vomiting (mild)  vaginal discharge (white or clear) This list may not describe all possible side effects. Call your doctor for medical advice about side effects. You may report side effects to FDA at 1-800-FDA-1088. Where should I keep my medicine? Keep out of the reach of children. Store at room temperature between 20 and 25 degrees C (68 and 77 degrees F). Protect from light. Keep container tightly closed. Throw away any unused medicine after the expiration date. NOTE: This sheet is a summary. It may not cover all possible information. If you have questions about this medicine, talk to your doctor, pharmacist, or health care provider.  2020 Elsevier/Gold Standard (2018-09-24 11:15:31)    Anastrozole tablets What is this medicine?  ANASTROZOLE (an AS troe zole) is used to treat breast cancer in women who have gone through menopause. Some types of breast cancer depend on estrogen to grow, and this medicine can stop tumor growth by blocking estrogen production. This medicine may be used for other purposes; ask your health care provider or pharmacist if you have questions. COMMON BRAND NAME(S): Arimidex What should I tell my health care provider before I take this medicine? They need to know if you have any of these conditions:  bone problems  heart disease  high cholesterol  an unusual or allergic reaction to anastrozole, other medicines, foods, dyes, or preservatives  pregnant or trying to get pregnant  breast-feeding How should I use this medicine? Take this medicine by mouth with a glass of water. Follow the directions on the prescription label. You can take it with or without food. If it upsets your stomach, take it with food. Take your medicine at regular intervals. Do not take it more often than directed. Do not stop taking except on your doctor's advice. Talk to your pediatrician regarding the use of this medicine in children. Special care may be needed. Overdosage: If you think you have taken too much of this medicine contact a poison control center or emergency room at once. NOTE: This medicine is only for you. Do not   share this medicine with others. What if I miss a dose? If you miss a dose, take it as soon as you can. If it is almost time for your next dose, take only that dose. Do not take double or extra doses. What may interact with this medicine? This medicine may interact with the following medications:  female hormones, like estrogens or progestins and birth control pills, patches, rings, or injections  tamoxifen This list may not describe all possible interactions. Give your health care provider a list of all the medicines, herbs, non-prescription drugs, or dietary supplements you use. Also tell  them if you smoke, drink alcohol, or use illegal drugs. Some items may interact with your medicine. What should I watch for while using this medicine? Visit your doctor or health care professional for regular checks on your progress. Let your doctor or health care professional know about any unusual vaginal bleeding. Do not become pregnant while taking this medicine or for at least 3 weeks after stopping it. Women should inform their doctor if they wish to become pregnant or think they might be pregnant. There is a potential for serious side effects to an unborn child. Talk to your health care professional or pharmacist for more information. Do not breast-feed an infant while taking this medicine or for 2 weeks after stopping it. This medicine may interfere with the ability to have a child. Talk with your doctor or health care professional if you are concerned about your fertility. Using this medicine for a long time may increase your risk of low bone mass. Talk to your doctor about bone health. You should make sure that you get enough calcium and vitamin D while you are taking this medicine. Discuss the foods you eat and the vitamins you take with your health care professional. What side effects may I notice from receiving this medicine? Side effects that you should report to your doctor or health care professional as soon as possible:  allergic reactions like skin rash, itching or hives, swelling of the face, lips, or tongue  signs and symptoms of a blood clot such as breathing problems; changes in vision; chest pain; sudden headache; pain, swelling, warmth in the leg; trouble speaking; sudden numbness or weakness of the face, arm, or leg  signs and symptoms of infection like fever or chills; cough; sore throat; pain or trouble passing urine Side effects that usually do not require medical attention (report to your doctor or health care professional if they continue or are bothersome):  bone pain   dizziness  hair loss  headache  hot flashes  joint pain  muscle pain  signs of decreased red blood cells - unusually weak or tired, feeling faint or lightheaded, falls  vaginal discharge, itching, or odor in women This list may not describe all possible side effects. Call your doctor for medical advice about side effects. You may report side effects to FDA at 1-800-FDA-1088. Where should I keep my medicine? Keep out of the reach of children. Store at room temperature between 20 and 25 degrees C (68 and 77 degrees F). Throw away any unused medicine after the expiration date. NOTE: This sheet is a summary. It may not cover all possible information. If you have questions about this medicine, talk to your doctor, pharmacist, or health care provider.  2020 Elsevier/Gold Standard (2017-10-15 14:56:51)  

## 2019-07-31 ENCOUNTER — Telehealth: Payer: Self-pay | Admitting: Adult Health

## 2019-07-31 ENCOUNTER — Encounter: Payer: Self-pay | Admitting: Adult Health

## 2019-07-31 NOTE — Telephone Encounter (Signed)
I talk with patient regarding schedule  

## 2019-08-05 ENCOUNTER — Ambulatory Visit: Payer: 59 | Admitting: Radiation Oncology

## 2019-08-06 ENCOUNTER — Telehealth: Payer: Self-pay | Admitting: Neurology

## 2019-08-06 NOTE — Telephone Encounter (Signed)
Pt last seen in 2016. I spoke with pt on phone and advised per office policy she would need a new neurology referral either from PCP or other physician. Pt is wanting to be seen for headaches. She understands Jan appt will be canceled for now until referral has been received with updated information from referring provider. She verbalized appreciation for the call.

## 2019-08-06 NOTE — Telephone Encounter (Signed)
Pt stopped by office for appointment, gave next available but is wanting something sooner. Please advise.

## 2019-08-07 ENCOUNTER — Encounter: Payer: Self-pay | Admitting: *Deleted

## 2019-08-07 ENCOUNTER — Other Ambulatory Visit: Payer: Self-pay

## 2019-08-07 ENCOUNTER — Ambulatory Visit: Payer: 59 | Admitting: Radiation Oncology

## 2019-08-07 ENCOUNTER — Inpatient Hospital Stay: Payer: No Typology Code available for payment source

## 2019-08-07 ENCOUNTER — Ambulatory Visit
Admission: RE | Admit: 2019-08-07 | Discharge: 2019-08-07 | Disposition: A | Discharge status: Home / Self Care | Payer: No Typology Code available for payment source | Source: Ambulatory Visit | Attending: Radiation Oncology | Admitting: Radiation Oncology

## 2019-08-07 DIAGNOSIS — Z170 Estrogen receptor positive status [ER+]: Secondary | ICD-10-CM

## 2019-08-07 DIAGNOSIS — C50312 Malignant neoplasm of lower-inner quadrant of left female breast: Secondary | ICD-10-CM

## 2019-08-07 DIAGNOSIS — Z510 Encounter for antineoplastic radiation therapy: Secondary | ICD-10-CM | POA: Insufficient documentation

## 2019-08-07 LAB — CMP (CANCER CENTER ONLY)
ALT: 18 U/L (ref 0–44)
AST: 19 U/L (ref 15–41)
Albumin: 3.7 g/dL (ref 3.5–5.0)
Alkaline Phosphatase: 56 U/L (ref 38–126)
Anion gap: 8 (ref 5–15)
BUN: 14 mg/dL (ref 6–20)
CO2: 31 mmol/L (ref 22–32)
Calcium: 9.4 mg/dL (ref 8.9–10.3)
Chloride: 101 mmol/L (ref 98–111)
Creatinine: 1.02 mg/dL — ABNORMAL HIGH (ref 0.44–1.00)
GFR, Est AFR Am: >60 mL/min (ref >60)
GFR, Estimated: >60 mL/min (ref >60)
Glucose, Bld: 87 mg/dL (ref 70–99)
Potassium: 3.4 mmol/L — ABNORMAL LOW (ref 3.5–5.1)
Sodium: 140 mmol/L (ref 135–145)
Total Bilirubin: 0.5 mg/dL (ref 0.3–1.2)
Total Protein: 8 g/dL (ref 6.5–8.1)

## 2019-08-07 NOTE — Progress Notes (Signed)
  Radiation Oncology         ([phone]) [phone] ________________________________  Name: Kara [name] MRN: [id number]  Date: 08/07/2019  DOB: [birth year]   DIAGNOSIS:     ICD-10-CM   1. Malignant neoplasm of lower-inner quadrant of left breast in female, estrogen receptor positive ([place])  C50.312    Z17.0     SIMULATION AND TREATMENT PLANNING NOTE  The patient presented for simulation prior to beginning her course of radiation treatment for her diagnosis of left-sided breast cancer. The patient was placed in a supine position on a breast board. A customized vac-lock bag was constructed and this complex treatment device will be used on a daily basis during her treatment. In this fashion, a CT scan was obtained through the chest area and an isocenter was placed near the chest wall within the breast.  The patient will be planned to receive a course of radiation initially to a dose of 42.56 Gy. This will consist of a whole breast radiotherapy technique. To accomplish this, 2 customized blocks have been designed which will correspond to medial and lateral whole breast tangent fields. This treatment will be accomplished at 2.66 Gy per fraction. A forward planning technique will also be evaluated to determine if this approach improves the plan. It is anticipated that the patient will then receive a 8 Gy boost to the seroma cavity which has been contoured. This will be accomplished at 2 Gy per fraction.   This initial treatment will consist of a 3-D conformal technique. The seroma has been contoured as the primary target structure. Additionally, dose volume histograms of both this target as well as the lungs and heart will also be evaluated. Such an approach is necessary to ensure that the target area is adequately covered while the nearby critical  normal structures are adequately spared.  Plan:  The final anticipated total dose therefore will correspond to 50.56 Gy.  Special treatment procedure  was performed today due to the extra time and effort required by myself to plan and prepare this patient for deep inspiration breath hold technique.  I have determined cardiac sparing to be of benefit to this patient to prevent long term cardiac damage due to radiation of the heart.  Bellows were placed on the patient's abdomen. To facilitate cardiac sparing, the patient was coached by the radiation therapists on breath hold techniques and breathing practice was performed. Practice waveforms were obtained. The patient was then scanned while maintaining breath hold in the treatment position.  This image was then transferred over to the imaging specialist. The imaging specialist then created a fusion of the free breathing and breath hold scans using the chest wall as the stable structure. I personally reviewed the fusion in axial, coronal and sagittal image planes.  Excellent cardiac sparing was obtained.  I felt the patient is an appropriate candidate for breath hold and the patient will be treated as such.  The image fusion was then reviewed with the patient to reinforce the necessity of reproducible breath hold.   _______________________________   Jodelle Gross, MD, PhD

## 2019-08-07 NOTE — Progress Notes (Signed)
  Radiation Oncology         [phone]) [phone] ________________________________  Name: Kara [name] MRN: [id number]  Date: 08/07/2019  DOB: 19-Oct-1965  Optical Surface Tracking Plan:  Since intensity modulated radiotherapy (IMRT) and 3D conformal radiation treatment methods are predicated on accurate and precise positioning for treatment, intrafraction motion monitoring is medically necessary to ensure accurate and safe treatment delivery.  The ability to quantify intrafraction motion without excessive ionizing radiation dose can only be performed with optical surface tracking. Accordingly, surface imaging offers the opportunity to obtain 3D measurements of patient position throughout IMRT and 3D treatments without excessive radiation exposure.  I am ordering optical surface tracking for this patient's upcoming course of radiotherapy. ________________________________  Kyung Rudd, MD 08/07/2019 2:20 PM    Reference:   Particia Jasper, et al. Surface imaging-based analysis of intrafraction motion for breast radiotherapy patients.Journal of [employer], n. 6, nov. 2014. ISSN [id number].   Available at: <http://www.jacmp.org/index.php/jacmp/article/view/4957>.

## 2019-08-08 DIAGNOSIS — Z510 Encounter for antineoplastic radiation therapy: Secondary | ICD-10-CM | POA: Diagnosis not present

## 2019-08-08 LAB — FOLLICLE STIMULATING HORMONE: FSH: 46.3 m[IU]/mL

## 2019-08-08 LAB — LUTEINIZING HORMONE: LH: 35.6 m[IU]/mL

## 2019-08-11 LAB — ESTRADIOL, ULTRA SENS: Estradiol, Sensitive: 37.2 pg/mL

## 2019-08-12 ENCOUNTER — Telehealth: Payer: Self-pay

## 2019-08-12 NOTE — Telephone Encounter (Signed)
-----   Message from Gardenia Phlegm, NP sent at 08/12/2019  7:51 AM EDT ----- Please let patient know that her labs show she is NOT post menopausal.   ----- Message ----- From: Buel Ream, Lab In [place] Sent: 08/08/2019   6:36 AM EDT To: Gardenia Phlegm, NP

## 2019-08-12 NOTE — Telephone Encounter (Signed)
LVM for patient to call back. ?

## 2019-08-13 NOTE — Telephone Encounter (Signed)
Patient returned call.  Nurse informed that lab shows that she is not post menopausal.  Patient voiced understanding and thanks for call.

## 2019-08-18 ENCOUNTER — Ambulatory Visit
Admission: RE | Admit: 2019-08-18 | Discharge: 2019-08-18 | Disposition: A | Discharge status: Home / Self Care | Payer: No Typology Code available for payment source | Source: Ambulatory Visit | Attending: Radiation Oncology | Admitting: Radiation Oncology

## 2019-08-18 ENCOUNTER — Other Ambulatory Visit: Payer: Self-pay

## 2019-08-18 DIAGNOSIS — C50312 Malignant neoplasm of lower-inner quadrant of left female breast: Secondary | ICD-10-CM | POA: Insufficient documentation

## 2019-08-18 DIAGNOSIS — Z510 Encounter for antineoplastic radiation therapy: Secondary | ICD-10-CM | POA: Diagnosis present

## 2019-08-18 DIAGNOSIS — Z170 Estrogen receptor positive status [ER+]: Secondary | ICD-10-CM | POA: Insufficient documentation

## 2019-08-19 ENCOUNTER — Ambulatory Visit
Admission: RE | Admit: 2019-08-19 | Discharge: 2019-08-19 | Disposition: A | Discharge status: Home / Self Care | Payer: No Typology Code available for payment source | Source: Ambulatory Visit | Attending: Radiation Oncology | Admitting: Radiation Oncology

## 2019-08-19 ENCOUNTER — Other Ambulatory Visit: Payer: Self-pay

## 2019-08-19 DIAGNOSIS — Z170 Estrogen receptor positive status [ER+]: Secondary | ICD-10-CM

## 2019-08-19 DIAGNOSIS — C50312 Malignant neoplasm of lower-inner quadrant of left female breast: Secondary | ICD-10-CM | POA: Diagnosis not present

## 2019-08-19 MED ORDER — RADIAPLEXRX EX GEL
Freq: Once | CUTANEOUS | Status: AC
  Administered 2019-08-19: 15:00:00 via TOPICAL

## 2019-08-19 MED ORDER — ALRA NON-METALLIC DEODORANT (RAD-ONC)
1.0000 "application " | Freq: Once | TOPICAL | Status: AC
  Administered 2019-08-19: 1 via TOPICAL

## 2019-08-20 ENCOUNTER — Other Ambulatory Visit: Payer: Self-pay

## 2019-08-20 ENCOUNTER — Ambulatory Visit
Admission: RE | Admit: 2019-08-20 | Discharge: 2019-08-20 | Disposition: A | Discharge status: Home / Self Care | Payer: No Typology Code available for payment source | Source: Ambulatory Visit | Attending: Radiation Oncology | Admitting: Radiation Oncology

## 2019-08-20 DIAGNOSIS — C50312 Malignant neoplasm of lower-inner quadrant of left female breast: Secondary | ICD-10-CM | POA: Diagnosis not present

## 2019-08-21 ENCOUNTER — Other Ambulatory Visit: Payer: Self-pay

## 2019-08-21 ENCOUNTER — Ambulatory Visit
Admission: RE | Admit: 2019-08-21 | Discharge: 2019-08-21 | Disposition: A | Discharge status: Home / Self Care | Payer: No Typology Code available for payment source | Source: Ambulatory Visit | Attending: Radiation Oncology | Admitting: Radiation Oncology

## 2019-08-21 DIAGNOSIS — C50312 Malignant neoplasm of lower-inner quadrant of left female breast: Secondary | ICD-10-CM | POA: Diagnosis not present

## 2019-08-21 NOTE — Telephone Encounter (Addendum)
Reached out to Mellen at [facility] and her notes record the patients medicaid was going to run out 2 months after the patient got set up with her cpap and she would have to pay $160 to cover the rest of the cost. The patient states to Ivin Booty that she would not be able to do that so she declined her cpap setup. Ivin Booty states the patient can do a HST to restart her therapy if she has been on her old cpap the entire time, if not she will have to have a in lab study.

## 2019-08-22 ENCOUNTER — Ambulatory Visit
Admission: RE | Admit: 2019-08-22 | Discharge: 2019-08-22 | Disposition: A | Discharge status: Home / Self Care | Payer: No Typology Code available for payment source | Source: Ambulatory Visit | Attending: Radiation Oncology | Admitting: Radiation Oncology

## 2019-08-22 ENCOUNTER — Other Ambulatory Visit: Payer: Self-pay

## 2019-08-22 DIAGNOSIS — C50312 Malignant neoplasm of lower-inner quadrant of left female breast: Secondary | ICD-10-CM | POA: Diagnosis not present

## 2019-08-25 ENCOUNTER — Ambulatory Visit
Admission: RE | Admit: 2019-08-25 | Discharge: 2019-08-25 | Disposition: A | Discharge status: Home / Self Care | Payer: No Typology Code available for payment source | Source: Ambulatory Visit | Attending: Radiation Oncology | Admitting: Radiation Oncology

## 2019-08-25 ENCOUNTER — Other Ambulatory Visit: Payer: Self-pay

## 2019-08-25 DIAGNOSIS — C50312 Malignant neoplasm of lower-inner quadrant of left female breast: Secondary | ICD-10-CM | POA: Diagnosis not present

## 2019-08-26 ENCOUNTER — Ambulatory Visit
Admission: RE | Admit: 2019-08-26 | Discharge: 2019-08-26 | Disposition: A | Discharge status: Home / Self Care | Payer: No Typology Code available for payment source | Source: Ambulatory Visit | Attending: Radiation Oncology | Admitting: Radiation Oncology

## 2019-08-26 ENCOUNTER — Other Ambulatory Visit: Payer: Self-pay

## 2019-08-26 DIAGNOSIS — C50312 Malignant neoplasm of lower-inner quadrant of left female breast: Secondary | ICD-10-CM | POA: Diagnosis not present

## 2019-08-27 ENCOUNTER — Ambulatory Visit
Admission: RE | Admit: 2019-08-27 | Discharge: 2019-08-27 | Disposition: A | Discharge status: Home / Self Care | Payer: No Typology Code available for payment source | Source: Ambulatory Visit | Attending: Radiation Oncology | Admitting: Radiation Oncology

## 2019-08-27 ENCOUNTER — Other Ambulatory Visit: Payer: Self-pay

## 2019-08-27 DIAGNOSIS — C50312 Malignant neoplasm of lower-inner quadrant of left female breast: Secondary | ICD-10-CM | POA: Diagnosis not present

## 2019-08-28 ENCOUNTER — Ambulatory Visit
Admission: RE | Admit: 2019-08-28 | Discharge: 2019-08-28 | Disposition: A | Discharge status: Home / Self Care | Payer: No Typology Code available for payment source | Source: Ambulatory Visit | Attending: Radiation Oncology | Admitting: Radiation Oncology

## 2019-08-28 ENCOUNTER — Other Ambulatory Visit: Payer: Self-pay

## 2019-08-28 DIAGNOSIS — C50312 Malignant neoplasm of lower-inner quadrant of left female breast: Secondary | ICD-10-CM | POA: Diagnosis not present

## 2019-08-29 ENCOUNTER — Other Ambulatory Visit: Payer: Self-pay

## 2019-08-29 ENCOUNTER — Ambulatory Visit
Admission: RE | Admit: 2019-08-29 | Discharge: 2019-08-29 | Disposition: A | Discharge status: Home / Self Care | Payer: No Typology Code available for payment source | Source: Ambulatory Visit | Attending: Radiation Oncology | Admitting: Radiation Oncology

## 2019-08-29 ENCOUNTER — Telehealth (INDEPENDENT_AMBULATORY_CARE_PROVIDER_SITE_OTHER): Payer: 59 | Admitting: Cardiology

## 2019-08-29 ENCOUNTER — Telehealth: Payer: Self-pay | Admitting: *Deleted

## 2019-08-29 ENCOUNTER — Encounter: Payer: Self-pay | Admitting: *Deleted

## 2019-08-29 ENCOUNTER — Encounter: Payer: Self-pay | Admitting: Cardiology

## 2019-08-29 VITALS — Ht 64.0 in | Wt 178.0 lb

## 2019-08-29 DIAGNOSIS — C50312 Malignant neoplasm of lower-inner quadrant of left female breast: Secondary | ICD-10-CM | POA: Diagnosis not present

## 2019-08-29 DIAGNOSIS — G4733 Obstructive sleep apnea (adult) (pediatric): Secondary | ICD-10-CM

## 2019-08-29 DIAGNOSIS — E669 Obesity, unspecified: Secondary | ICD-10-CM

## 2019-08-29 DIAGNOSIS — I10 Essential (primary) hypertension: Secondary | ICD-10-CM

## 2019-08-29 NOTE — Patient Instructions (Signed)
Medication Instructions:   Your physician recommends that you continue on your current medications as directed. Please refer to the Current Medication list given to you today.  *If you need a refill on your cardiac medications before your next appointment, please call your pharmacy*    Follow-Up:  Our sleep study Coordinator Nina Jones will be in contact with you soon to discuss your sleep needs.  Her direct contact information is [phone].   

## 2019-08-29 NOTE — Telephone Encounter (Signed)
-----   Message from Sueanne Margarita, MD sent at 08/29/2019  2:24 PM EST ----- Please order a split night sleep study for OSA

## 2019-08-29 NOTE — Progress Notes (Signed)
Virtual Visit via Video Note   This visit type was conducted due to national recommendations for restrictions regarding the COVID-19 Pandemic (e.g. social distancing) in an effort to limit this patient's exposure and mitigate transmission in our community.  Due to her co-morbid illnesses, this patient is at least at moderate risk for complications without adequate follow up.  This format is felt to be most appropriate for this patient at this time.  All issues noted in this document were discussed and addressed.  A limited physical exam was performed with this format.  Please refer to the patient's chart for her consent to telehealth for [facility].   Evaluation Performed:  Follow-up visit  This visit type was conducted due to national recommendations for restrictions regarding the COVID-19 Pandemic (e.g. social distancing).  This format is felt to be most appropriate for this patient at this time.  All issues noted in this document were discussed and addressed.  No physical exam was performed (except for noted visual exam findings with Video Visits).  Please refer to the patient's chart (MyChart message for video visits and phone note for telephone visits) for the patient's consent to telehealth for [facility].  Date:  08/29/2019   ID:  Kara [name], DOB May 26, 1966, MRN [id number]  Patient Location:  Home  Provider location:   [place]  PCP:  Aretta Nip, MD  Sleep Medicine:  Fransico Him, MD Electrophysiologist:  None   Chief Complaint:  OSA  History of Present Illness:    Kara [name] is a 53 y.o. female who presents via audio/video conferencing for a telehealth visit today.    Kara [name] is a 53 y.o. 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 When I saw her last we ordered a CPAP device but unfortunately this is not get carried through due to issues with insurance and then the patient had breast  cancer and was very busy with it.   She then had to have a D&C and was told she needed to get back on CPAP.  When she had been using it there were no problems with the device except that she felt that is was dirty and could not get it cleaned.  Her current device is 52 years old.  During the day she gets fatigued especially when driving long distances now that she is not using it.  She used to wake up energized but that has gone away since stopping CPAP.  She has not used her device since September.   The patient does not have symptoms concerning for COVID-19 infection (fever, chills, cough, or new shortness of breath).    Prior CV studies:   The following studies were reviewed today:  PAP compliance download  Past Medical History:  Diagnosis Date   Abnormal uterine bleeding (AUB)    Cancer ([place]) 04/2019   left breast DCIS   Carpal tunnel syndrome    Depression    Family history of breast cancer    HA (headache)    Hypertension    Migraines    OSA (obstructive sleep apnea)    Mild OSA AHI 9.3/hr now on CPAP at 6CM H2O   Sleep apnea    not used CPAP in over a month   Past Surgical History:  Procedure Laterality Date   ABLATION     BREAST LUMPECTOMY WITH RADIOACTIVE SEED LOCALIZATION Left 07/08/2019   Procedure: LEFT BREAST  LUMPECTOMY X 3  WITH RADIOACTIVE SEED LOCALIZATION;  Surgeon: Erroll Luna, MD;  Location: [place];  Service: General;  Laterality: Left;   BREAST RECONSTRUCTION Left 07/15/2019   Procedure: LEFT BREAST ONCOPLASTIC RECONSTRUCTION;  Surgeon: Irene Limbo, MD;  Location: [place];  Service: Plastics;  Laterality: Left;   CESAREAN SECTION     x2   DILATATION & CURETTAGE/HYSTEROSCOPY WITH MYOSURE N/A 06/04/2019   Procedure: DILATATION /HYSTEROSCOPY;  Surgeon: Janyth Pupa, DO;  Location: [place];  Service: Gynecology;  Laterality: N/A;   LAPAROSCOPY N/A 06/04/2019   Procedure: LAPAROSCOPY  Diagnostic, Repair Uterine Perforation;  Surgeon: Janyth Pupa, DO;  Location: [place];  Service: Gynecology;  Laterality: N/A;   MASTOPEXY Right 07/15/2019   Procedure: RIGHT BREAST MASTOPEXY;  Surgeon: Irene Limbo, MD;  Location: [place];  Service: Plastics;  Laterality: Right;   tummy tuck       Current Meds  Medication Sig   buPROPion (WELLBUTRIN XL) 150 MG 24 hr tablet TAKE 1 TABLET BY MOUTH EVERY DAY IN THE MORNING   clindamycin (CLEOCIN T) 1 % lotion APPLY A SMALL AMOUNT TO SKIN TWICE A DAY AS NEEDED   hydrochlorothiazide (HYDRODIURIL) 25 MG tablet Take 25 mg by mouth daily.   hydroquinone 4 % cream APPLY A SMALL AMOUNT TOPICALLY EVERY DAY TO THE SKIN   Multiple Vitamins-Minerals (HAIR SKIN AND NAILS FORMULA PO) Take as directed   naproxen (NAPROSYN) 500 MG tablet TAKE 1 TABLET BY MOUTH EVERY 12 HOURS AS NEEDED FOR PAIN 30 DAYS     Allergies:   Imitrex [sumatriptan], Hydrocil [psyllium], and Other   Social History   Tobacco Use   Smoking status: Never Smoker   Smokeless tobacco: Never Used  Substance Use Topics   Alcohol use: Yes    Alcohol/week: 0.0 standard drinks    Comment: social   Drug use: No     Family Hx: The patient's family history includes Breast cancer in her maternal aunt and other family members; Breast cancer (age of onset: 68) in her sister; Hypertension in her mother; Lung cancer in her maternal uncle; Other (age of onset: 32) in her sister.  ROS:   Please see the history of present illness.     All other systems reviewed and are negative.   Labs/Other Tests and Data Reviewed:    Recent Labs: 05/29/2019: Hemoglobin 11.2; Platelets 342 08/07/2019: ALT 18; BUN 14; Creatinine 1.02; Potassium 3.4; Sodium 140   Recent Lipid Panel No results found for: CHOL, TRIG, HDL, CHOLHDL, LDLCALC, LDLDIRECT  Wt Readings from Last 3 Encounters:  08/29/19 178 lb (80.7 kg)  07/30/19 178 lb 6.4 oz (80.9 kg)    07/15/19 180 lb 12.4 oz (82 kg)     Objective:    Vital Signs:  Ht 5\' 4"  (1.626 m)    Wt 178 lb (80.7 kg)    BMI 30.55 kg/m     ASSESSMENT & PLAN:    1.  OSA  -when I saw her last we ordered a CPAP device but unfortunately this is not get carried through due to issues with insurance and then the patient had breast cancer and was very busy with it.   -She then had to have a D&C and was told she needed to get back on CPAP. -I will order a split night sleep study since she has not used her device in 2 months and then get back on CPAP  2.  HTN -BP controlled on exam -continue HCTZ 25mg  daily  3.  Obesity -I have encouraged her to get into a routine exercise program and cut back on carbs and portions.    COVID-19 Education: The signs and symptoms of COVID-19 were discussed with the patient and how to seek care for testing (follow up with PCP or arrange E-visit).  The importance of social distancing was discussed today.  Patient Risk:   After full review of this patient's clinical status, I feel that they are at least moderate risk at this time.  Time:   Today, I have spent 20 minutes directly with the patient on telemedicine discussing medical problems including OSA, HTN.  We also reviewed the symptoms of COVID 19 and the ways to protect against contracting the virus with telehealth technology.  I spent an additional 5 minutes reviewing patient's chart including PAP compliance download.  Medication Adjustments/Labs and Tests Ordered: Current medicines are reviewed at length with the patient today.  Concerns regarding medicines are outlined above.  Tests Ordered: No orders of the defined types were placed in this encounter.  Medication Changes: No orders of the defined types were placed in this encounter.   Disposition:  Follow up with me after sleep study  Signed, Fransico Him, MD  08/29/2019 2:13 PM    [facility]

## 2019-09-01 ENCOUNTER — Other Ambulatory Visit: Payer: Self-pay

## 2019-09-01 ENCOUNTER — Ambulatory Visit
Admission: RE | Admit: 2019-09-01 | Discharge: 2019-09-01 | Disposition: A | Discharge status: Home / Self Care | Payer: No Typology Code available for payment source | Source: Ambulatory Visit | Attending: Radiation Oncology | Admitting: Radiation Oncology

## 2019-09-01 ENCOUNTER — Telehealth: Payer: Self-pay | Admitting: *Deleted

## 2019-09-01 DIAGNOSIS — C50312 Malignant neoplasm of lower-inner quadrant of left female breast: Secondary | ICD-10-CM | POA: Diagnosis not present

## 2019-09-01 NOTE — Addendum Note (Signed)
Addended by: Freada Bergeron on: 09/01/2019 02:56 PM   Modules accepted: Orders

## 2019-09-01 NOTE — Telephone Encounter (Signed)
PA submitted to [employer] for sleep study via web portal. 

## 2019-09-02 ENCOUNTER — Other Ambulatory Visit: Payer: Self-pay

## 2019-09-02 ENCOUNTER — Ambulatory Visit
Admission: RE | Admit: 2019-09-02 | Discharge: 2019-09-02 | Disposition: A | Discharge status: Home / Self Care | Payer: No Typology Code available for payment source | Source: Ambulatory Visit | Attending: Radiation Oncology | Admitting: Radiation Oncology

## 2019-09-02 DIAGNOSIS — C50312 Malignant neoplasm of lower-inner quadrant of left female breast: Secondary | ICD-10-CM | POA: Diagnosis not present

## 2019-09-03 ENCOUNTER — Ambulatory Visit
Admission: RE | Admit: 2019-09-03 | Discharge: 2019-09-03 | Disposition: A | Discharge status: Home / Self Care | Payer: No Typology Code available for payment source | Source: Ambulatory Visit | Attending: Radiation Oncology | Admitting: Radiation Oncology

## 2019-09-03 ENCOUNTER — Other Ambulatory Visit: Payer: Self-pay

## 2019-09-03 DIAGNOSIS — C50312 Malignant neoplasm of lower-inner quadrant of left female breast: Secondary | ICD-10-CM | POA: Diagnosis not present

## 2019-09-04 ENCOUNTER — Other Ambulatory Visit: Payer: Self-pay

## 2019-09-04 ENCOUNTER — Ambulatory Visit
Admission: RE | Admit: 2019-09-04 | Discharge: 2019-09-04 | Disposition: A | Discharge status: Home / Self Care | Payer: No Typology Code available for payment source | Source: Ambulatory Visit | Attending: Radiation Oncology | Admitting: Radiation Oncology

## 2019-09-04 DIAGNOSIS — C50312 Malignant neoplasm of lower-inner quadrant of left female breast: Secondary | ICD-10-CM | POA: Diagnosis not present

## 2019-09-05 ENCOUNTER — Other Ambulatory Visit: Payer: Self-pay

## 2019-09-05 ENCOUNTER — Ambulatory Visit
Admission: RE | Admit: 2019-09-05 | Discharge: 2019-09-05 | Disposition: A | Discharge status: Home / Self Care | Payer: No Typology Code available for payment source | Source: Ambulatory Visit | Attending: Radiation Oncology | Admitting: Radiation Oncology

## 2019-09-05 DIAGNOSIS — C50312 Malignant neoplasm of lower-inner quadrant of left female breast: Secondary | ICD-10-CM | POA: Diagnosis not present

## 2019-09-08 ENCOUNTER — Ambulatory Visit
Admission: RE | Admit: 2019-09-08 | Discharge: 2019-09-08 | Disposition: A | Discharge status: Home / Self Care | Payer: No Typology Code available for payment source | Source: Ambulatory Visit | Attending: Radiation Oncology | Admitting: Radiation Oncology

## 2019-09-08 ENCOUNTER — Other Ambulatory Visit: Payer: Self-pay

## 2019-09-08 DIAGNOSIS — C50312 Malignant neoplasm of lower-inner quadrant of left female breast: Secondary | ICD-10-CM | POA: Diagnosis not present

## 2019-09-09 ENCOUNTER — Ambulatory Visit
Admission: RE | Admit: 2019-09-09 | Discharge: 2019-09-09 | Disposition: A | Discharge status: Home / Self Care | Payer: No Typology Code available for payment source | Source: Ambulatory Visit | Attending: Radiation Oncology | Admitting: Radiation Oncology

## 2019-09-09 ENCOUNTER — Other Ambulatory Visit: Payer: Self-pay

## 2019-09-09 DIAGNOSIS — C50312 Malignant neoplasm of lower-inner quadrant of left female breast: Secondary | ICD-10-CM | POA: Diagnosis not present

## 2019-09-10 ENCOUNTER — Other Ambulatory Visit: Payer: Self-pay

## 2019-09-10 ENCOUNTER — Ambulatory Visit
Admission: RE | Admit: 2019-09-10 | Discharge: 2019-09-10 | Disposition: A | Discharge status: Home / Self Care | Payer: No Typology Code available for payment source | Source: Ambulatory Visit | Attending: Radiation Oncology | Admitting: Radiation Oncology

## 2019-09-10 DIAGNOSIS — C50312 Malignant neoplasm of lower-inner quadrant of left female breast: Secondary | ICD-10-CM | POA: Diagnosis not present

## 2019-09-15 ENCOUNTER — Ambulatory Visit
Admission: RE | Admit: 2019-09-15 | Discharge: 2019-09-15 | Disposition: A | Discharge status: Home / Self Care | Payer: No Typology Code available for payment source | Source: Ambulatory Visit | Attending: Radiation Oncology | Admitting: Radiation Oncology

## 2019-09-15 ENCOUNTER — Other Ambulatory Visit: Payer: Self-pay

## 2019-09-15 ENCOUNTER — Telehealth: Payer: Self-pay | Admitting: *Deleted

## 2019-09-15 DIAGNOSIS — C50312 Malignant neoplasm of lower-inner quadrant of left female breast: Secondary | ICD-10-CM | POA: Diagnosis not present

## 2019-09-15 NOTE — Telephone Encounter (Signed)
Staff message sent to [place]. In lab sleep study denied by [place]. Provider can order HST no PA required.

## 2019-09-16 ENCOUNTER — Other Ambulatory Visit: Payer: Self-pay

## 2019-09-16 ENCOUNTER — Ambulatory Visit
Admission: RE | Admit: 2019-09-16 | Discharge: 2019-09-16 | Disposition: A | Discharge status: Home / Self Care | Payer: No Typology Code available for payment source | Source: Ambulatory Visit | Attending: Radiation Oncology | Admitting: Radiation Oncology

## 2019-09-16 DIAGNOSIS — D0512 Intraductal carcinoma in situ of left breast: Secondary | ICD-10-CM | POA: Diagnosis present

## 2019-09-16 DIAGNOSIS — R232 Flushing: Secondary | ICD-10-CM | POA: Diagnosis not present

## 2019-09-16 DIAGNOSIS — F329 Major depressive disorder, single episode, unspecified: Secondary | ICD-10-CM | POA: Diagnosis not present

## 2019-09-16 DIAGNOSIS — Z886 Allergy status to analgesic agent status: Secondary | ICD-10-CM | POA: Diagnosis not present

## 2019-09-16 DIAGNOSIS — Z510 Encounter for antineoplastic radiation therapy: Secondary | ICD-10-CM | POA: Insufficient documentation

## 2019-09-16 DIAGNOSIS — Z79899 Other long term (current) drug therapy: Secondary | ICD-10-CM | POA: Diagnosis not present

## 2019-09-16 DIAGNOSIS — G4733 Obstructive sleep apnea (adult) (pediatric): Secondary | ICD-10-CM | POA: Diagnosis not present

## 2019-09-16 DIAGNOSIS — Z79811 Long term (current) use of aromatase inhibitors: Secondary | ICD-10-CM | POA: Diagnosis not present

## 2019-09-16 DIAGNOSIS — M2550 Pain in unspecified joint: Secondary | ICD-10-CM | POA: Diagnosis not present

## 2019-09-16 DIAGNOSIS — Z8249 Family history of ischemic heart disease and other diseases of the circulatory system: Secondary | ICD-10-CM | POA: Diagnosis not present

## 2019-09-16 DIAGNOSIS — Z90722 Acquired absence of ovaries, bilateral: Secondary | ICD-10-CM | POA: Diagnosis not present

## 2019-09-16 DIAGNOSIS — I10 Essential (primary) hypertension: Secondary | ICD-10-CM | POA: Diagnosis not present

## 2019-09-16 DIAGNOSIS — Z170 Estrogen receptor positive status [ER+]: Secondary | ICD-10-CM | POA: Diagnosis not present

## 2019-09-16 DIAGNOSIS — Z803 Family history of malignant neoplasm of breast: Secondary | ICD-10-CM | POA: Diagnosis not present

## 2019-09-16 DIAGNOSIS — Z801 Family history of malignant neoplasm of trachea, bronchus and lung: Secondary | ICD-10-CM | POA: Diagnosis not present

## 2019-09-16 DIAGNOSIS — C50312 Malignant neoplasm of lower-inner quadrant of left female breast: Secondary | ICD-10-CM | POA: Insufficient documentation

## 2019-09-16 DIAGNOSIS — M797 Fibromyalgia: Secondary | ICD-10-CM | POA: Diagnosis not present

## 2019-09-17 ENCOUNTER — Ambulatory Visit
Admission: RE | Admit: 2019-09-17 | Discharge: 2019-09-17 | Disposition: A | Discharge status: Home / Self Care | Payer: No Typology Code available for payment source | Source: Ambulatory Visit | Attending: Radiation Oncology | Admitting: Radiation Oncology

## 2019-09-17 ENCOUNTER — Telehealth: Payer: Self-pay | Admitting: *Deleted

## 2019-09-17 ENCOUNTER — Other Ambulatory Visit: Payer: Self-pay

## 2019-09-17 DIAGNOSIS — G4733 Obstructive sleep apnea (adult) (pediatric): Secondary | ICD-10-CM

## 2019-09-17 DIAGNOSIS — D0512 Intraductal carcinoma in situ of left breast: Secondary | ICD-10-CM | POA: Diagnosis not present

## 2019-09-17 NOTE — Telephone Encounter (Signed)
-----   Message from Lauralee Evener, [place] sent at 09/15/2019  9:28 AM EST ----- Regarding: RE: precert [employer] denied split night study. Provider can order HST.no PA is required. ----- Message ----- From: Freada Bergeron, CMA Sent: 08/29/2019   2:49 PM EST To: [facility] Subject: precert                                         ----- Message ----- From: Sueanne Margarita, MD Sent: 08/29/2019   2:24 PM EST To: Freada Bergeron, CMA  Please order a split night sleep study for OSA

## 2019-09-18 ENCOUNTER — Other Ambulatory Visit: Payer: Self-pay

## 2019-09-18 ENCOUNTER — Ambulatory Visit
Admission: RE | Admit: 2019-09-18 | Discharge: 2019-09-18 | Disposition: A | Discharge status: Home / Self Care | Payer: No Typology Code available for payment source | Source: Ambulatory Visit | Attending: Radiation Oncology | Admitting: Radiation Oncology

## 2019-09-18 DIAGNOSIS — D0512 Intraductal carcinoma in situ of left breast: Secondary | ICD-10-CM | POA: Diagnosis not present

## 2019-09-18 NOTE — Telephone Encounter (Signed)
Patient is aware and agreeable to Home Sleep Study through [facility]. Patient is scheduled for 11/14/19 at 12 pm to pick up home sleep kit and meet with Respiratory therapist at [facility]. Patient is aware that if this appointment date and time does not work for them they should contact Artis Delay directly at [phone]. Patient is aware that a sleep packet will be sent from [facility] in week. Patient is agreeable to treatment and thankful for call.

## 2019-09-19 ENCOUNTER — Other Ambulatory Visit: Payer: Self-pay

## 2019-09-19 ENCOUNTER — Ambulatory Visit
Admission: RE | Admit: 2019-09-19 | Discharge: 2019-09-19 | Disposition: A | Discharge status: Home / Self Care | Payer: No Typology Code available for payment source | Source: Ambulatory Visit | Attending: Radiation Oncology | Admitting: Radiation Oncology

## 2019-09-19 DIAGNOSIS — D0512 Intraductal carcinoma in situ of left breast: Secondary | ICD-10-CM | POA: Diagnosis not present

## 2019-09-22 ENCOUNTER — Encounter (HOSPITAL_BASED_OUTPATIENT_CLINIC_OR_DEPARTMENT_OTHER): Payer: Self-pay

## 2019-09-22 ENCOUNTER — Ambulatory Visit
Admission: RE | Admit: 2019-09-22 | Discharge: 2019-09-22 | Disposition: A | Discharge status: Home / Self Care | Payer: No Typology Code available for payment source | Source: Ambulatory Visit | Attending: Radiation Oncology | Admitting: Radiation Oncology

## 2019-09-22 ENCOUNTER — Other Ambulatory Visit: Payer: Self-pay

## 2019-09-22 DIAGNOSIS — D0512 Intraductal carcinoma in situ of left breast: Secondary | ICD-10-CM | POA: Diagnosis not present

## 2019-09-23 ENCOUNTER — Other Ambulatory Visit: Payer: Self-pay

## 2019-09-23 ENCOUNTER — Ambulatory Visit
Admission: RE | Admit: 2019-09-23 | Discharge: 2019-09-23 | Disposition: A | Discharge status: Home / Self Care | Payer: No Typology Code available for payment source | Source: Ambulatory Visit | Attending: Radiation Oncology | Admitting: Radiation Oncology

## 2019-09-23 DIAGNOSIS — D0512 Intraductal carcinoma in situ of left breast: Secondary | ICD-10-CM | POA: Diagnosis not present

## 2019-09-24 ENCOUNTER — Other Ambulatory Visit: Payer: Self-pay

## 2019-09-24 ENCOUNTER — Ambulatory Visit
Admission: RE | Admit: 2019-09-24 | Discharge: 2019-09-24 | Disposition: A | Discharge status: Home / Self Care | Payer: No Typology Code available for payment source | Source: Ambulatory Visit | Attending: Radiation Oncology | Admitting: Radiation Oncology

## 2019-09-24 ENCOUNTER — Inpatient Hospital Stay: Payer: No Typology Code available for payment source | Attending: Oncology | Admitting: Oncology

## 2019-09-24 VITALS — BP 144/78 | HR 83 | Temp 97.8°F | Resp 18 | Ht 64.0 in | Wt 185.0 lb

## 2019-09-24 DIAGNOSIS — Z8249 Family history of ischemic heart disease and other diseases of the circulatory system: Secondary | ICD-10-CM | POA: Insufficient documentation

## 2019-09-24 DIAGNOSIS — G4733 Obstructive sleep apnea (adult) (pediatric): Secondary | ICD-10-CM | POA: Insufficient documentation

## 2019-09-24 DIAGNOSIS — Z79811 Long term (current) use of aromatase inhibitors: Secondary | ICD-10-CM | POA: Insufficient documentation

## 2019-09-24 DIAGNOSIS — Z886 Allergy status to analgesic agent status: Secondary | ICD-10-CM | POA: Insufficient documentation

## 2019-09-24 DIAGNOSIS — F329 Major depressive disorder, single episode, unspecified: Secondary | ICD-10-CM | POA: Insufficient documentation

## 2019-09-24 DIAGNOSIS — C50312 Malignant neoplasm of lower-inner quadrant of left female breast: Secondary | ICD-10-CM

## 2019-09-24 DIAGNOSIS — Z803 Family history of malignant neoplasm of breast: Secondary | ICD-10-CM | POA: Insufficient documentation

## 2019-09-24 DIAGNOSIS — D0512 Intraductal carcinoma in situ of left breast: Secondary | ICD-10-CM | POA: Diagnosis not present

## 2019-09-24 DIAGNOSIS — R232 Flushing: Secondary | ICD-10-CM | POA: Insufficient documentation

## 2019-09-24 DIAGNOSIS — Z801 Family history of malignant neoplasm of trachea, bronchus and lung: Secondary | ICD-10-CM | POA: Insufficient documentation

## 2019-09-24 DIAGNOSIS — M797 Fibromyalgia: Secondary | ICD-10-CM | POA: Insufficient documentation

## 2019-09-24 DIAGNOSIS — I10 Essential (primary) hypertension: Secondary | ICD-10-CM | POA: Insufficient documentation

## 2019-09-24 DIAGNOSIS — Z79899 Other long term (current) drug therapy: Secondary | ICD-10-CM | POA: Insufficient documentation

## 2019-09-24 DIAGNOSIS — M2550 Pain in unspecified joint: Secondary | ICD-10-CM | POA: Insufficient documentation

## 2019-09-24 DIAGNOSIS — Z90722 Acquired absence of ovaries, bilateral: Secondary | ICD-10-CM | POA: Insufficient documentation

## 2019-09-24 DIAGNOSIS — Z170 Estrogen receptor positive status [ER+]: Secondary | ICD-10-CM

## 2019-09-24 MED ORDER — POTASSIUM CHLORIDE ER 10 MEQ PO TBCR: 10.0000 meq | EXTENDED_RELEASE_TABLET | Freq: Every day | ORAL | 4 refills | Status: DC

## 2019-09-24 MED ORDER — TAMOXIFEN CITRATE 20 MG PO TABS: 20.0000 mg | ORAL_TABLET | Freq: Every day | ORAL | 4 refills | Status: AC

## 2019-09-24 NOTE — Progress Notes (Signed)
[place]  [phone] [phone]     ID: Kara [name] DOB: [birth year]  MR#: [id number]  OYD#:[id number]  Patient Care Team: Kara Nip, MD as PCP - General (Family Medicine) Kara Margarita, MD as Consulting Physician (Cardiology) Kara Limbo, MD as Consulting Physician (Plastic Surgery) Kara Luna, MD as Consulting Physician (General Surgery) Kara [name], Kara Dad, MD as Consulting Physician (Oncology) Kara Rudd, MD as Consulting Physician (Radiation Oncology) Kara Pupa, DO as Consulting Physician (Obstetrics and Gynecology) Kara Cruel, MD OTHER MD:  CHIEF COMPLAINT: Estrogen receptor positive ductal carcinoma in situ  CURRENT TREATMENT: adjuvant radiation    INTERVAL HISTORY: Kara [name] is here today for follow up of her estrogen positive breast cancer.   She started radiation treatments on 08/18/2019 under the care of Dr. Lisbeth [name]. Her last treatment is scheduled for 10/03/2019.   REVIEW OF SYSTEMS: Kara [name] cannot tell me how many more radiation treatments she has ahead of her.  She just comes daily and does not want to think about it any more than that.  She has been having trouble dealing with her cancer diagnosis, which of course is not uncommon.  And she has been started on Wellbutrin and hydroxyzine, which she says are helping.  She is not exercising regularly.  She is keeping appropriate pandemic precautions.  A detailed review of systems was otherwise stable.   HISTORY OF CURRENT ILLNESS: From the original intake note:  Kara [name] had routine screening mammography on 04/11/2019 showing a possible abnormality in the left breast. She underwent bilateral diagnostic mammography with tomography at [facility] on 04/17/2019 showing: breast density category C; indeterminate 4 cm calcifications in the lower-inner quadrant of the left breast.   Accordingly on 04/23/2019 she proceeded to biopsy of  the left breast area in question. The pathology from this procedure ([id number]) showed: ductal carcinoma in situ with calcifications, low grade; fibrocystic and fibroadenomatoid change.  Prognostic indicators significant for: estrogen receptor, 90% positive with strong staining intensity and progesterone receptor, 60% positive with moderate staining intensity.   The patient's subsequent history is as detailed below.   PAST MEDICAL HISTORY: Past Medical History:  Diagnosis Date  . Abnormal uterine bleeding (AUB)   . Cancer ([place]) 04/2019   left breast DCIS  . Carpal tunnel syndrome   . Depression   . Family history of breast cancer   . HA (headache)   . Hypertension   . Migraines   . OSA (obstructive sleep apnea)    Mild OSA AHI 9.3/hr now on CPAP at 6CM H2O  . Sleep apnea    not used CPAP in over a month    PAST SURGICAL HISTORY: Past Surgical History:  Procedure Laterality Date  . ABLATION    . BREAST LUMPECTOMY WITH RADIOACTIVE SEED LOCALIZATION Left 07/08/2019   Procedure: LEFT BREAST LUMPECTOMY X 3  WITH RADIOACTIVE SEED LOCALIZATION;  Surgeon: Kara Luna, MD;  Location: [facility];  Service: General;  Laterality: Left;  . BREAST RECONSTRUCTION Left 07/15/2019   Procedure: LEFT BREAST ONCOPLASTIC RECONSTRUCTION;  Surgeon: Kara Limbo, MD;  Location: [facility];  Service: Plastics;  Laterality: Left;  . CESAREAN SECTION     x2  . DILATATION & CURETTAGE/HYSTEROSCOPY WITH MYOSURE N/A 06/04/2019   Procedure: DILATATION /HYSTEROSCOPY;  Surgeon: Kara Pupa, DO;  Location: [facility];  Service: Gynecology;  Laterality: N/A;  . LAPAROSCOPY N/A 06/04/2019   Procedure: LAPAROSCOPY Diagnostic, Repair Uterine Perforation;  Surgeon:  Kara Pupa, DO;  Location: [facility];  Service: Gynecology;  Laterality: N/A;  . MASTOPEXY Right 07/15/2019   Procedure: RIGHT BREAST MASTOPEXY;  Surgeon: Kara Limbo, MD;   Location: [facility];  Service: Plastics;  Laterality: Right;  . tummy tuck    right ankle surgery as a kid   FAMILY HISTORY: Family History  Problem Relation Age of Onset  . Hypertension Mother   . Breast cancer Sister 54  . Breast cancer Maternal Aunt        dx 50s-60s  . Lung cancer Maternal Uncle   . Breast cancer Other        MGM's sister  . Other Sister 34       MVA  . Breast cancer Other        Mother's maternal cousins - x3   Patient's father is living at age 24-80. Patient's mother is also living at age 22. (as of 05/2019) The patient's maternal half-sister was diagnosed with breast cancer at age 8. Another half-sister had breast cancer before the age of 26 and has since passed away.  The patient has 2 half siblings on her mother's [name] and 3 half siblings on her father's. The patient denies a family hx of ovarian, prostate or pancreatic cancer.   GYNECOLOGIC HISTORY:  No LMP recorded. Patient has had an ablation. Menarche: 53 years old Age at first live birth: 53 years old [place] P 2 LMP she is currently spotting Contraceptive: used for ~25 years with no problems HRT n/a  Hysterectomy? no BSO? 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 She also has a degree in chemistry. She is married but separated. Husband Kara [name] is a Barrister's clerk. They have not completed any separation paperwork at this time and she describes him as friendly and supportive. She lives at home with her youngest son Kara [name], age 4, a Ship broker.  Older son Kara [name], age 64, lives in [place] as a Ship broker of [employer] in liberal arts.  The patient attends [employer].    ADVANCED DIRECTIVES: Kara [name] is comfortable with her husband Kara [name] as her HCPOA   HEALTH MAINTENANCE: Social History   Tobacco Use  . Smoking status: Never Smoker  . Smokeless tobacco: Never Used  Substance  Use Topics  . Alcohol use: Yes    Alcohol/week: 0.0 standard drinks    Comment: social  . Drug use: No     Colonoscopy:   PAP: Due  Bone density: never done   Allergies  Allergen Reactions  . Imitrex [Sumatriptan] Other (See Comments)    Nauseated - Feel like passing out - lack of therapeutic effect.  . Hydrocil [Psyllium] Nausea And Vomiting  . Other     "some kind of pain medicine a long time ago made me feel nauseous"     Current Outpatient Medications  Medication Sig Dispense Refill  . buPROPion (WELLBUTRIN XL) 150 MG 24 hr tablet TAKE 1 TABLET BY MOUTH EVERY DAY IN THE MORNING    . clindamycin (CLEOCIN T) 1 % lotion APPLY A SMALL AMOUNT TO SKIN TWICE A DAY AS NEEDED    . hydrochlorothiazide (HYDRODIURIL) 25 MG tablet Take 25 mg by mouth daily.    . hydroquinone 4 % cream APPLY A SMALL AMOUNT TOPICALLY EVERY DAY TO THE SKIN    . Multiple Vitamins-Minerals (HAIR SKIN AND NAILS FORMULA  PO) Take as directed    . naproxen (NAPROSYN) 500 MG tablet TAKE 1 TABLET BY MOUTH EVERY 12 HOURS AS NEEDED FOR PAIN 30 DAYS    . potassium chloride (KLOR-CON) 10 MEQ tablet Take 1 tablet (10 mEq total) by mouth daily. 90 tablet 4  . tamoxifen (NOLVADEX) 20 MG tablet Take 1 tablet (20 mg total) by mouth daily. 90 tablet 4   No current facility-administered medications for this visit.     OBJECTIVE: Middle-aged African-American woman in no acute distress  Vitals:   09/24/19 1518  BP: (!) 144/78  Pulse: 83  Resp: 18  Temp: 97.8 F (36.6 C)  SpO2: 100%     Body mass index is 31.76 kg/m.   Wt Readings from Last 3 Encounters:  09/24/19 185 lb (83.9 kg)  08/29/19 178 lb (80.7 kg)  07/30/19 178 lb 6.4 oz (80.9 kg)      ECOG FS:1 - Symptomatic but completely ambulatory  Sclerae unicteric, EOMs intact Wearing a mask No cervical or supraclavicular adenopathy Lungs no rales or rhonchi Heart regular rate and rhythm Abd soft, nontender, positive bowel sounds MSK no focal spinal  tenderness, no upper extremity lymphedema Neuro: nonfocal, well oriented, appropriate affect Breasts: Status post bilateral breast mammoplasties and left lumpectomy.  Currently receiving radiation to the left breast.  There is significant hyperpigmentation.  There is minimal dry desquamation.  LAB RESULTS:  CMP     Component Value Date/Time   NA 140 08/07/2019 1121   NA 138 11/02/2014 0932   K 3.4 (L) 08/07/2019 1121   CL 101 08/07/2019 1121   CO2 31 08/07/2019 1121   GLUCOSE 87 08/07/2019 1121   BUN 14 08/07/2019 1121   BUN 16 11/02/2014 0932   CREATININE 1.02 (H) 08/07/2019 1121   CALCIUM 9.4 08/07/2019 1121   PROT 8.0 08/07/2019 1121   ALBUMIN 3.7 08/07/2019 1121   AST 19 08/07/2019 1121   ALT 18 08/07/2019 1121   ALKPHOS 56 08/07/2019 1121   BILITOT 0.5 08/07/2019 1121   GFRNONAA >60 08/07/2019 1121   GFRAA >60 08/07/2019 1121    No results found for: TOTALPROTELP, ALBUMINELP, A1GS, A2GS, BETS, BETA2SER, GAMS, MSPIKE, SPEI  No results found for: KPAFRELGTCHN, LAMBDASER, KAPLAMBRATIO  Lab Results  Component Value Date   WBC 3.7 (L) 05/29/2019   HGB 11.2 (L) 05/29/2019   HCT 33.5 (L) 05/29/2019   MCV 95.2 05/29/2019   PLT 342 05/29/2019    No results found for: LABCA2  No components found for: RKYHCW237  No results for input(s): INR in the last 168 hours.  No results found for: LABCA2  No results found for: SEG315  No results found for: VVO160  No results found for: VPX106  No results found for: CA2729  No components found for: HGQUANT  No results found for: CEA1 / No results found for: CEA1   No results found for: AFPTUMOR  No results found for: CHROMOGRNA  No results found for: PSA1  No visits with results within 3 Day(s) from this visit.  Latest known visit with results is:  Appointment on 08/07/2019  Component Date Value Ref Range Status  . LH 08/07/2019 35.6  mIU/mL Final   Comment: (NOTE)                    Adult Female:                       Follicular phase  2.4 -  12.6                      Ovulation phase      14.0 -  95.6                      Luteal phase          1.0 -  11.4                      Postmenopausal        7.7 -  58.5 Performed At: [facility] [place], [place] [id number] Rush Farmer MD VF:[id number]   . [facility] 08/07/2019 46.3  mIU/mL Final   Comment: (NOTE)                    Adult Female:                      Follicular phase      3.5 -  12.5                      Ovulation phase       4.7 -  21.5                      Luteal phase          1.7 -   7.7                      Postmenopausal       25.8 - 134.8 Performed At: [facility] [place], [place] [id number] Rush Farmer MD SW:[id number]   . Estradiol, Sensitive 08/07/2019 37.2  pg/mL Final   Comment: (NOTE)           Female:            Follicular:                    30.0 - 100.0            Luteal:                        70.0 - 300.0            Postmenopausal:                      < 15.0 This test was developed and its performance characteristics determined by [facility]. It has not been cleared by the Food and Drug Administration. Methodology: Liquid chromatography tandem mass spectrometry(LC/MS/MS) Performed At: [facility] [place], [place] [id number] Rush Farmer MD WC:[id number]   . Sodium 08/07/2019 140  135 - 145 mmol/L Final  . Potassium 08/07/2019 3.4* 3.5 - 5.1 mmol/L Final  . Chloride 08/07/2019 101  98 - 111 mmol/L Final  . CO2 08/07/2019 31  22 - 32 mmol/L Final  . Glucose, Bld 08/07/2019 87  70 - 99 mg/dL Final  . BUN 08/07/2019 14  6 - 20 mg/dL Final  . Creatinine 08/07/2019 1.02* 0.44 - 1.00 mg/dL Final  . Calcium 08/07/2019 9.4  8.9 - 10.3 mg/dL Final  . Total Protein 08/07/2019 8.0  6.5 - 8.1 g/dL Final  . Albumin 08/07/2019 3.7  3.5 - 5.0 g/dL Final  . AST 08/07/2019 19  15 - 41 U/L Final  .  ALT 08/07/2019 18  0 - 44 U/L Final  . Alkaline  Phosphatase 08/07/2019 56  38 - 126 U/L Final  . Total Bilirubin 08/07/2019 0.5  0.3 - 1.2 mg/dL Final  . GFR, Est Non Af Am 08/07/2019 >60  >60 mL/min Final  . GFR, Est AFR Am 08/07/2019 >60  >60 mL/min Final  . Anion gap 08/07/2019 8  5 - 15 Final   Performed at [facility], [place] [address], [place], [place] [address]    (this displays the last labs from the last 3 days)  No results found for: TOTALPROTELP, ALBUMINELP, A1GS, A2GS, BETS, BETA2SER, GAMS, MSPIKE, SPEI (this displays SPEP labs)  No results found for: KPAFRELGTCHN, LAMBDASER, KAPLAMBRATIO (kappa/lambda light chains)  No results found for: HGBA, HGBA2QUANT, HGBFQUANT, HGBSQUAN (Hemoglobinopathy evaluation)   No results found for: LDH  No results found for: IRON, TIBC, IRONPCTSAT (Iron and TIBC)  No results found for: FERRITIN  Urinalysis No results found for: COLORURINE, APPEARANCEUR, LABSPEC, PHURINE, GLUCOSEU, HGBUR, BILIRUBINUR, KETONESUR, PROTEINUR, UROBILINOGEN, NITRITE, LEUKOCYTESUR   STUDIES: No results found.  ELIGIBLE FOR AVAILABLE RESEARCH PROTOCOL: opted against COMET  ASSESSMENT: 53 y.o.  Kara [name] woman status post left breast biopsy 04/23/2023 ductal carcinoma in situ, low-grade, estrogen and progesterone receptor positive  (1) s/p left lumpectomy on 07/08/2019 showing 0.2cm DCIS, grade 1, margins negative  (a) s/p bilateral mammoplasty on 07/15/2019  (2) adjuvant radiation started 08/07/2019  (3) considering prophylactic antiestrogens  (a) on 08/07/2019 her LH was 35.6 and FSH 46.3, with estradiol 37.5  (4) genetics testing 05/30/2019 through the [facility] STAT Panel + Common Hereditary Cancers Panel found no deleterious mutations in ATM, BRCA1, BRCA2, CDH1, CHEK2, PALB2, PTEN, STK11 and TP53. or in APC, ATM, AXIN2, BARD1, BMPR1A, BRCA1, BRCA2, BRIP1, CDH1, CDKN2A (p14ARF), CDKN2A (p16INK4a), CKD4, CHEK2, CTNNA1, DICER1, EPCAM (Deletion/duplication testing only), GREM1  (promoter region deletion/duplication testing only), KIT, MEN1, MLH1, MSH2, MSH3, MSH6, MUTYH, NBN, NF1, NHTL1, PALB2, PDGFRA, PMS2, POLD1, POLE, PTEN, RAD50, RAD51C, RAD51D, RNF43, SDHB, SDHC, SDHD, SMAD4, SMARCA4. STK11, TP53, TSC1, TSC2, and VHL.  The following genes were evaluated for sequence changes only: SDHA and HOXB13 c.251G>A variant only.   PLAN: Rosenda is getting through her radiation treatments.  She had questions regarding FMLA and forms which apparently were not sent in on time.  She has discussed this with the appropriate people here and apparently that has been taken care of.  She is expecting to get back to work sometime in January.  She understands that she has a nonlife-threatening breast cancer, that it was very small, and that the risk of it recurring is very low.  She also understands she has approximately a 1 %/year chance of developing another breast cancer in either breast and of course if it is in the same breast and she would need to have a mastectomy there because we could not repeat the radiation treatments.  With this background we discussed antiestrogens in detail.  She understands the difference between tamoxifen and anastrozole in detail. She understands that anastrozole and the aromatase inhibitors in general work by blocking estrogen production. Accordingly vaginal dryness, decrease in bone density, and of course hot flashes can result. The aromatase inhibitors can also negatively affect the cholesterol profile, although that is a minor effect. One out of 5 women on aromatase inhibitors we will feel "old and achy". This arthralgia/myalgia syndrome, which resembles fibromyalgia clinically, does resolve with stopping the medications. Accordingly this is not a reason to not try  an aromatase inhibitor but it is a frequent reason to stop it (in other words 20% of women will not be able to tolerate these medications).  Tamoxifen on the other hand does not block estrogen  production. It does not "take away a woman's estrogen". It blocks the estrogen receptor in breast cells. Like anastrozole, it can also cause hot flashes. As opposed to anastrozole, tamoxifen has many estrogen-like effects. It is technically an estrogen receptor modulator. This means that in some tissues tamoxifen works like estrogen-- for example it helps strengthen the bones. It tends to improve the cholesterol profile. It can cause thickening of the endometrial lining, and even endometrial polyps or rarely cancer of the uterus.(The risk of uterine cancer due to tamoxifen is one additional cancer per thousand women year). It can cause vaginal wetness or stickiness. It can cause blood clots through this estrogen-like effect--the risk of blood clots with tamoxifen is exactly the same as with birth control pills or hormone replacement.  Neither of these agents causes mood changes or weight gain, despite the popular belief that they can have these [name] effects. We have data from studies comparing either of these drugs with placebo, and in those cases the control group had the same amount of weight gain and depression as the group that took the drug.  After this discussion she would like to try it tamoxifen.  We are going to not start until early January to make sure she has recovered sufficiently from radiation.  She will see me again after her repeat mammography, will be in July, and of course she will call if she has any unusual [name] effects from that medication.  Assuming all is well at that time we will start seeing her on a once a year basis thereafter  She wanted to know if I could move her test for sleep apnea up so they could be done before the end of the year.  Unfortunately I have no way to accomplish that.  Also I started her on potassium 10 mEq a day since last time we checked her potassium.  Was a bit low and of course she is on a diuretic.  She knows to call for any other issues that may develop  before the next visit.  Kara [name]. Amayra Kiedrowski, MD  09/24/2019 6:40 PM Medical Oncology and Hematology [facility] [place], [place] [address] Tel. [phone]    Fax. [phone]   I, Wilburn Mylar, am acting as scribe for Dr. Virgie [name]. Celestine Prim.  I, Lurline Del MD, have reviewed the above documentation for accuracy and completeness, and I agree with the above.

## 2019-09-25 ENCOUNTER — Ambulatory Visit
Admission: RE | Admit: 2019-09-25 | Discharge: 2019-09-25 | Disposition: A | Discharge status: Home / Self Care | Payer: No Typology Code available for payment source | Source: Ambulatory Visit | Attending: Radiation Oncology | Admitting: Radiation Oncology

## 2019-09-25 ENCOUNTER — Other Ambulatory Visit: Payer: Self-pay

## 2019-09-25 ENCOUNTER — Telehealth: Payer: Self-pay | Admitting: Oncology

## 2019-09-25 DIAGNOSIS — D0512 Intraductal carcinoma in situ of left breast: Secondary | ICD-10-CM | POA: Diagnosis not present

## 2019-09-25 NOTE — Telephone Encounter (Signed)
I left a message regarding schedule  

## 2019-09-26 ENCOUNTER — Other Ambulatory Visit: Payer: Self-pay

## 2019-09-26 ENCOUNTER — Ambulatory Visit: Payer: No Typology Code available for payment source | Admitting: Radiation Oncology

## 2019-09-26 ENCOUNTER — Ambulatory Visit
Admission: RE | Admit: 2019-09-26 | Discharge: 2019-09-26 | Disposition: A | Discharge status: Home / Self Care | Payer: No Typology Code available for payment source | Source: Ambulatory Visit | Attending: Radiation Oncology | Admitting: Radiation Oncology

## 2019-09-26 DIAGNOSIS — Z170 Estrogen receptor positive status [ER+]: Secondary | ICD-10-CM

## 2019-09-26 DIAGNOSIS — D0512 Intraductal carcinoma in situ of left breast: Secondary | ICD-10-CM | POA: Diagnosis not present

## 2019-09-26 MED ORDER — SONAFINE EX EMUL
1.0000 "application " | Freq: Two times a day (BID) | CUTANEOUS | Status: DC
  Administered 2019-09-26: 1 via TOPICAL

## 2019-09-29 ENCOUNTER — Ambulatory Visit: Payer: No Typology Code available for payment source

## 2019-09-30 ENCOUNTER — Ambulatory Visit: Payer: No Typology Code available for payment source

## 2019-09-30 ENCOUNTER — Ambulatory Visit
Admission: RE | Admit: 2019-09-30 | Discharge: 2019-09-30 | Disposition: A | Discharge status: Home / Self Care | Payer: No Typology Code available for payment source | Source: Ambulatory Visit | Attending: Radiation Oncology | Admitting: Radiation Oncology

## 2019-09-30 ENCOUNTER — Other Ambulatory Visit: Payer: Self-pay

## 2019-09-30 DIAGNOSIS — D0512 Intraductal carcinoma in situ of left breast: Secondary | ICD-10-CM | POA: Diagnosis not present

## 2019-10-01 ENCOUNTER — Other Ambulatory Visit: Payer: Self-pay

## 2019-10-01 ENCOUNTER — Ambulatory Visit
Admission: RE | Admit: 2019-10-01 | Discharge: 2019-10-01 | Disposition: A | Discharge status: Home / Self Care | Payer: No Typology Code available for payment source | Source: Ambulatory Visit | Attending: Radiation Oncology | Admitting: Radiation Oncology

## 2019-10-01 DIAGNOSIS — D0512 Intraductal carcinoma in situ of left breast: Secondary | ICD-10-CM | POA: Diagnosis not present

## 2019-10-02 ENCOUNTER — Ambulatory Visit
Admission: RE | Admit: 2019-10-02 | Discharge: 2019-10-02 | Disposition: A | Discharge status: Home / Self Care | Payer: No Typology Code available for payment source | Source: Ambulatory Visit | Attending: Radiation Oncology | Admitting: Radiation Oncology

## 2019-10-02 ENCOUNTER — Other Ambulatory Visit: Payer: Self-pay

## 2019-10-02 DIAGNOSIS — D0512 Intraductal carcinoma in situ of left breast: Secondary | ICD-10-CM | POA: Diagnosis not present

## 2019-10-03 ENCOUNTER — Ambulatory Visit: Payer: No Typology Code available for payment source

## 2019-10-03 ENCOUNTER — Ambulatory Visit
Admission: RE | Admit: 2019-10-03 | Discharge: 2019-10-03 | Disposition: A | Discharge status: Home / Self Care | Payer: No Typology Code available for payment source | Source: Ambulatory Visit | Attending: Radiation Oncology | Admitting: Radiation Oncology

## 2019-10-03 ENCOUNTER — Other Ambulatory Visit: Payer: Self-pay

## 2019-10-03 DIAGNOSIS — D0512 Intraductal carcinoma in situ of left breast: Secondary | ICD-10-CM | POA: Diagnosis not present

## 2019-10-06 ENCOUNTER — Encounter: Payer: Self-pay | Admitting: Radiation Oncology

## 2019-10-06 ENCOUNTER — Other Ambulatory Visit: Payer: Self-pay

## 2019-10-06 ENCOUNTER — Ambulatory Visit
Admission: RE | Admit: 2019-10-06 | Discharge: 2019-10-06 | Disposition: A | Discharge status: Home / Self Care | Payer: No Typology Code available for payment source | Source: Ambulatory Visit | Attending: Radiation Oncology | Admitting: Radiation Oncology

## 2019-10-06 ENCOUNTER — Encounter: Payer: Self-pay | Admitting: *Deleted

## 2019-10-06 DIAGNOSIS — D0512 Intraductal carcinoma in situ of left breast: Secondary | ICD-10-CM | POA: Diagnosis not present

## 2019-10-20 ENCOUNTER — Ambulatory Visit: Payer: 59 | Admitting: Neurology

## 2019-10-27 ENCOUNTER — Telehealth: Payer: Self-pay | Admitting: *Deleted

## 2019-10-27 NOTE — Telephone Encounter (Signed)
Awaiting return call.  Message left for patient requesting return call with return to work date for form completion.

## 2019-10-28 ENCOUNTER — Telehealth: Payer: Self-pay | Admitting: *Deleted

## 2019-10-28 NOTE — Telephone Encounter (Signed)
FYI  "Not actually sure if I'm capable of going back to work.  Currently on depression drugs and sleep during the day because I am not able to sleep at night.  Tired with trouble focusing.  PCP ordered a new medication for depression (Celexa) I'll pick up today and see PCP 11/12/2019.  Use vistaril at night for sleep but awake by 10:00 pm.  Five minutes on my elliptical is all I can do.  Sleep study scheduled 11/14/2019.  My CPAP is 53 years old.  Told not to use because of germs.  Put whatever date as far out as possible for return to work.  I'll contact Dr. Radford Pax."

## 2019-10-28 NOTE — Telephone Encounter (Signed)
Her FMLA criteria per this office was based on radiation therapy and recovery.  She may need to have her currently treating MD's give her an extension.  If she feels she is having PTSD post diagnosis - is she seeing a counselor ? If not we can make a SW referral for someone to call her.  Thanks Val

## 2019-10-28 NOTE — Telephone Encounter (Signed)
Returned call lmtcb about her sleep question.

## 2019-10-30 ENCOUNTER — Telehealth: Payer: Self-pay | Admitting: Radiation Oncology

## 2019-10-30 NOTE — Telephone Encounter (Signed)
  Radiation Oncology         [phone] ________________________________  Name: Kara [name] MRN: [id number]  Date of Service: 10/30/2019  DOB: Sep 16, 1966  Post Treatment Telephone Note  Diagnosis:   Low Grade, ER/PR positive DCIS of the left breast.  Interval Since Last Radiation:  4 weeks   08/18/2019-10/06/2019: The left breast was treated to 50.4 Gy in 28 fractions  Narrative:  The patient was contacted today for routine follow-up. During treatment she did very well with radiotherapy and did not have significant desquamation. She reports she is doing okay but is pretty fatigued. She reports that she's been out of work for this and has a hard time waking up in the morning and has tried exercise but hasn't noted much improvement.   Impression/Plan: 1. Low Grade, ER/PR positive DCIS of the left breast. The patient has been doing well since completion of radiotherapy. We discussed that we would be happy to continue to follow her as needed, but she will also continue to follow up with Dr. Jana Hakim in medical oncology. She was counseled on skin care as well as measures to avoid sun exposure to this area.  2. Survivorship. We discussed the importance of survivorship evaluation which she is scheduled for in March 2021. 3. Fatigue. The patient's fatigue is more significant than I would anticipate from radiotherapy but it is still possible that this is contributing to her symptoms based on the short period of time since treatment ended. I encouraged her to also follow up with her PCP who she plans to see this month to rule out thyroid disease or vitamin D deficiency.    Carola Rhine, PAC

## 2019-11-14 ENCOUNTER — Ambulatory Visit (HOSPITAL_BASED_OUTPATIENT_CLINIC_OR_DEPARTMENT_OTHER): Payer: 59

## 2019-11-14 ENCOUNTER — Other Ambulatory Visit: Payer: Self-pay

## 2019-11-14 DIAGNOSIS — G4733 Obstructive sleep apnea (adult) (pediatric): Secondary | ICD-10-CM | POA: Insufficient documentation

## 2019-11-19 ENCOUNTER — Telehealth: Payer: Self-pay | Admitting: *Deleted

## 2019-11-19 NOTE — Telephone Encounter (Signed)
Patient is aware and agreeable to Home Sleep Study through [facility]. Patient is scheduled for 11/20/19 at 10:30 to pick up home sleep kit and meet with Respiratory therapist at [facility]. Patient is aware that if this appointment date and time does not work for them they should contact Artis Delay directly at [phone]. Patient is aware that a sleep packet will be sent from [facility] in week. Patient is agreeable to treatment and thankful for call

## 2019-11-20 ENCOUNTER — Ambulatory Visit (HOSPITAL_BASED_OUTPATIENT_CLINIC_OR_DEPARTMENT_OTHER): Payer: 59 | Admitting: Cardiology

## 2019-11-21 ENCOUNTER — Other Ambulatory Visit: Payer: Self-pay

## 2019-11-25 ENCOUNTER — Encounter (HOSPITAL_BASED_OUTPATIENT_CLINIC_OR_DEPARTMENT_OTHER): Payer: 59 | Admitting: Cardiology

## 2019-11-25 DIAGNOSIS — G4733 Obstructive sleep apnea (adult) (pediatric): Secondary | ICD-10-CM | POA: Diagnosis not present

## 2019-11-28 ENCOUNTER — Telehealth: Payer: Self-pay | Admitting: *Deleted

## 2019-11-28 NOTE — Telephone Encounter (Signed)
Patient is aware and agreeable to Home Sleep Study through [facility]. Patient is scheduled for 11/25/19 at 2:00 pm to pick up home sleep kit and meet with Respiratory therapist at [facility]. Patient is aware that if this appointment date and time does not work for them they should contact Artis Delay directly at [phone]. Patient is aware that a sleep packet will be sent from [facility] in week. Patient is agreeable to treatment and thankful for call

## 2019-11-28 NOTE — Procedures (Signed)
    Patient Name: Kara, [name] Date: 11/26/2019 Gender: Female D.O.B: Jan 12, 1966 Age (years): 58 Referring Provider: Fransico Him MD, ABSM Height (inches): 65 Interpreting Physician: Fransico Him MD, ABSM Weight (lbs): 179 RPSGT: Jonna Coup BMI: 30 MRN: [id number]  CLINICAL INFORMATION Sleep Study Type: HST  Indication for sleep study: N/A  Epworth Sleepiness Score: NA  SLEEP STUDY TECHNIQUE A multi-channel overnight portable sleep study was performed. The channels recorded were: nasal airflow, thoracic respiratory movement, and oxygen saturation with a pulse oximetry. Snoring was also monitored.  MEDICATIONS Patient self administered medications include: N/A.  SLEEP ARCHITECTURE Patient was studied for 291.8 minutes. The sleep efficiency was 100.0 % and the patient was supine for 99.9%. The arousal index was 0.0 per hour.  RESPIRATORY PARAMETERS The overall AHI was 13.8 per hour, with a central apnea index of 0.0 per hour.  The oxygen nadir was 76% during sleep.  CARDIAC DATA Mean heart rate during sleep was 55.9 bpm.  IMPRESSIONS - Mild obstructive sleep apnea occurred during this study (AHI = 13.8/h). - No significant central sleep apnea occurred during this study (CAI = 0.0/h). - Severe oxygen desaturation was noted during this study (Min O2 = 76%). - Patient snored 8.8% during the sleep.  DIAGNOSIS - Obstructive Sleep Apnea (327.23 [G47.33 ICD-10])  RECOMMENDATIONS - Therapeutic CPAP titration to determine optimal pressure required to alleviate sleep disordered breathing. - Positional therapy avoiding supine position during sleep. - Avoid alcohol, sedatives and other CNS depressants that may worsen sleep apnea and disrupt normal sleep architecture. - Sleep hygiene should be reviewed to assess factors that may improve sleep quality. - Weight management and regular exercise should be initiated or continued.  [Electronically signed] 11/28/2019  06:52 PM  Fransico Him MD, ABSM Diplomate, American Board of Sleep Medicine

## 2019-12-02 NOTE — Procedures (Unsigned)
Erroneous encounter

## 2019-12-02 NOTE — Progress Notes (Signed)
  Radiation Oncology         [phone] ________________________________  Name: Kara [name] MRN: [id number]  Date: 10/06/2019  DOB: [birth year]  End of Treatment Note  Diagnosis:   left-sided breast cancer     Indication for treatment:  Curative       Radiation treatment dates:   08/18/19 - 10/06/19  Site/dose:   The patient initially received a dose of 50.4 Gy in 28 fractions to the breast using whole-breast tangent fields. This was delivered using a 3-D conformal technique. The patient then received a boost to the seroma. This delivered an additional 10 Gy in 5 fractions using a 3-field photon boost technique. The total dose was 60.4 Gy.  Narrative: The patient tolerated radiation treatment relatively well.   The patient had some expected skin irritation as she progressed during treatment. Moist desquamation was not present at the end of treatment.  Plan: The patient has completed radiation treatment. The patient will return to radiation oncology clinic for routine followup in one month. I advised the patient to call or return sooner if they have any questions or concerns related to their recovery or treatment. ________________________________  Jodelle Gross, M.D., Ph.D.

## 2019-12-04 ENCOUNTER — Telehealth: Payer: Self-pay | Admitting: *Deleted

## 2019-12-04 DIAGNOSIS — G4733 Obstructive sleep apnea (adult) (pediatric): Secondary | ICD-10-CM

## 2019-12-04 NOTE — Telephone Encounter (Signed)
Informed patient of sleep study results and patient understanding was verbalized. Patient understands her sleep study showed they have sleep apnea and recommend CPAP titration. Pt is aware and agreeable to her results. Titration sent to sleep pool.

## 2019-12-04 NOTE — Telephone Encounter (Signed)
-----   Message from Sueanne Margarita, MD sent at 11/28/2019  6:54 PM EST ----- Please let patient know that they have sleep apnea and recommend CPAP titration. Please set up titration in the sleep lab.

## 2019-12-11 ENCOUNTER — Telehealth: Payer: Self-pay | Admitting: *Deleted

## 2019-12-11 NOTE — Telephone Encounter (Signed)
Staff message sent to Gae Bon ok to schedule CPAP titration study. Per  [employer] portal no PA required.

## 2019-12-15 NOTE — Addendum Note (Signed)
Addended by: Freada Bergeron on: 12/15/2019 01:21 PM   Modules accepted: Orders

## 2019-12-17 NOTE — Procedures (Signed)
Erroneous encouter

## 2019-12-17 NOTE — Progress Notes (Signed)
This encounter was created in error - please disregard.

## 2019-12-22 ENCOUNTER — Telehealth: Payer: Self-pay | Admitting: *Deleted

## 2019-12-22 NOTE — Telephone Encounter (Signed)
Patient is scheduled for CPAP Titration on 01/01/20. pt is scheduled for COVID screening on 12/29/19 prior to titration.  Patient understands her titration study will be done at [facility] sleep lab. Patient understands she will receive a letter in a week or so detailing appointment, date, time, and location. Patient understands to call if she does not receive the letter  in a timely manner. Left detailed message on voicemail with date and time of titration and informed patient to call back to confirm or reschedule.

## 2019-12-22 NOTE — Telephone Encounter (Signed)
-----   Message from Lauralee Evener, [place] sent at 12/11/2019  3:31 PM EST ----- Regarding: RE: precert - pt states she has [employer] as well Ok to schedule. Per [employer] portal no PA is required. ----- Message ----- From: Freada Bergeron, CMA Sent: 12/04/2019   3:45 PM EST To: Cv Div Sleep Studies Subject: precert - pt states she has medicaid insuran#   recommend CPAP titration

## 2019-12-23 ENCOUNTER — Encounter: Payer: Self-pay | Admitting: Adult Health

## 2019-12-23 ENCOUNTER — Inpatient Hospital Stay: Payer: 59 | Attending: Oncology | Admitting: Adult Health

## 2019-12-23 ENCOUNTER — Inpatient Hospital Stay: Payer: 59

## 2019-12-23 ENCOUNTER — Other Ambulatory Visit: Payer: Self-pay

## 2019-12-23 VITALS — BP 159/71 | HR 69 | Temp 98.3°F | Resp 20 | Ht 64.0 in | Wt 183.2 lb

## 2019-12-23 DIAGNOSIS — Z923 Personal history of irradiation: Secondary | ICD-10-CM | POA: Diagnosis not present

## 2019-12-23 DIAGNOSIS — M8580 Other specified disorders of bone density and structure, unspecified site: Secondary | ICD-10-CM | POA: Diagnosis not present

## 2019-12-23 DIAGNOSIS — R5383 Other fatigue: Secondary | ICD-10-CM | POA: Diagnosis not present

## 2019-12-23 DIAGNOSIS — R634 Abnormal weight loss: Secondary | ICD-10-CM | POA: Diagnosis not present

## 2019-12-23 DIAGNOSIS — E559 Vitamin D deficiency, unspecified: Secondary | ICD-10-CM

## 2019-12-23 DIAGNOSIS — Z803 Family history of malignant neoplasm of breast: Secondary | ICD-10-CM | POA: Insufficient documentation

## 2019-12-23 DIAGNOSIS — R232 Flushing: Secondary | ICD-10-CM | POA: Diagnosis not present

## 2019-12-23 DIAGNOSIS — Z8249 Family history of ischemic heart disease and other diseases of the circulatory system: Secondary | ICD-10-CM | POA: Insufficient documentation

## 2019-12-23 DIAGNOSIS — C50312 Malignant neoplasm of lower-inner quadrant of left female breast: Secondary | ICD-10-CM | POA: Insufficient documentation

## 2019-12-23 DIAGNOSIS — Z79899 Other long term (current) drug therapy: Secondary | ICD-10-CM | POA: Insufficient documentation

## 2019-12-23 DIAGNOSIS — G479 Sleep disorder, unspecified: Secondary | ICD-10-CM | POA: Diagnosis not present

## 2019-12-23 DIAGNOSIS — Z170 Estrogen receptor positive status [ER+]: Secondary | ICD-10-CM | POA: Diagnosis not present

## 2019-12-23 DIAGNOSIS — Z886 Allergy status to analgesic agent status: Secondary | ICD-10-CM | POA: Insufficient documentation

## 2019-12-23 DIAGNOSIS — I10 Essential (primary) hypertension: Secondary | ICD-10-CM | POA: Diagnosis not present

## 2019-12-23 DIAGNOSIS — Z801 Family history of malignant neoplasm of trachea, bronchus and lung: Secondary | ICD-10-CM | POA: Diagnosis not present

## 2019-12-23 LAB — CBC WITH DIFFERENTIAL (CANCER CENTER ONLY)
Abs Immature Granulocytes: 0.01 10*3/uL (ref 0.00–0.07)
Basophils Absolute: 0 10*3/uL (ref 0.0–0.1)
Basophils Relative: 0 %
Eosinophils Absolute: 0 10*3/uL (ref 0.0–0.5)
Eosinophils Relative: 1 %
HCT: 33.7 % — ABNORMAL LOW (ref 36.0–46.0)
Hemoglobin: 11.2 g/dL — ABNORMAL LOW (ref 12.0–15.0)
Immature Granulocytes: 0 %
Lymphocytes Relative: 22 %
Lymphs Abs: 0.6 10*3/uL — ABNORMAL LOW (ref 0.7–4.0)
MCH: 31.7 pg (ref 26.0–34.0)
MCHC: 33.2 g/dL (ref 30.0–36.0)
MCV: 95.5 fL (ref 80.0–100.0)
Monocytes Absolute: 0.3 10*3/uL (ref 0.1–1.0)
Monocytes Relative: 13 %
Neutro Abs: 1.6 10*3/uL — ABNORMAL LOW (ref 1.7–7.7)
Neutrophils Relative %: 64 %
Platelet Count: 307 10*3/uL (ref 150–400)
RBC: 3.53 MIL/uL — ABNORMAL LOW (ref 3.87–5.11)
RDW: 13 % (ref 11.5–15.5)
WBC Count: 2.5 10*3/uL — ABNORMAL LOW (ref 4.0–10.5)
nRBC: 0 % (ref 0.0–0.2)

## 2019-12-23 LAB — CMP (CANCER CENTER ONLY)
ALT: 9 U/L (ref 0–44)
AST: 14 U/L — ABNORMAL LOW (ref 15–41)
Albumin: 3.7 g/dL (ref 3.5–5.0)
Alkaline Phosphatase: 47 U/L (ref 38–126)
Anion gap: 7 (ref 5–15)
BUN: 12 mg/dL (ref 6–20)
CO2: 32 mmol/L (ref 22–32)
Calcium: 9 mg/dL (ref 8.9–10.3)
Chloride: 105 mmol/L (ref 98–111)
Creatinine: 0.91 mg/dL (ref 0.44–1.00)
GFR, Est AFR Am: >60 mL/min (ref >60)
GFR, Estimated: >60 mL/min (ref >60)
Glucose, Bld: 99 mg/dL (ref 70–99)
Potassium: 3.9 mmol/L (ref 3.5–5.1)
Sodium: 144 mmol/L (ref 135–145)
Total Bilirubin: 0.3 mg/dL (ref 0.3–1.2)
Total Protein: 7.5 g/dL (ref 6.5–8.1)

## 2019-12-23 LAB — VITAMIN B12: Vitamin B-12: 1148 pg/mL — ABNORMAL HIGH (ref 180–914)

## 2019-12-23 LAB — VITAMIN D 25 HYDROXY (VIT D DEFICIENCY, FRACTURES): Vit D, 25-Hydroxy: 36.54 ng/mL (ref 30–100)

## 2019-12-23 NOTE — Progress Notes (Signed)
SURVIVORSHIP VISIT:    BRIEF ONCOLOGIC HISTORY:  Oncology History  Malignant neoplasm of lower-inner quadrant of left breast in female, estrogen receptor positive ([place])  04/30/2019 Initial Diagnosis   Malignant neoplasm of lower-inner quadrant of left breast in female, estrogen receptor positive ([place])   04/30/2019 Cancer Staging   Staging form: Breast, AJCC 8th Edition - Clinical: Stage 0 (cTis (DCIS), cN0, cM0, ER+, PR+) - Signed by Gardenia Phlegm, NP on 04/30/2019   05/22/2019 Genetic Testing   Negative genetic testing. No pathogenic variants identified on the [facility] STAT Panel + Common Hereditary Cancers Panel. The STAT Breast cancer panel offered by [facility] includes sequencing and rearrangement analysis for the following 9 genes:  ATM, BRCA1, BRCA2, CDH1, CHEK2, PALB2, PTEN, STK11 and TP53.  The Common Hereditary Cancers Panel offered by [facility] includes sequencing and/or deletion duplication testing of the following 48 genes: APC, ATM, AXIN2, BARD1, BMPR1A, BRCA1, BRCA2, BRIP1, CDH1, CDKN2A (p14ARF), CDKN2A (p16INK4a), CKD4, CHEK2, CTNNA1, DICER1, EPCAM (Deletion/duplication testing only), GREM1 (promoter region deletion/duplication testing only), KIT, MEN1, MLH1, MSH2, MSH3, MSH6, MUTYH, NBN, NF1, NHTL1, PALB2, PDGFRA, PMS2, POLD1, POLE, PTEN, RAD50, RAD51C, RAD51D, RNF43, SDHB, SDHC, SDHD, SMAD4, SMARCA4. STK11, TP53, TSC1, TSC2, and VHL.  The following genes were evaluated for sequence changes only: SDHA and HOXB13 c.251G>A variant only. The report date is 05/30/2019.    07/08/2019 Cancer Staging   Staging form: Breast, AJCC 8th Edition - Pathologic stage from 07/08/2019: Stage 0 (pTis (DCIS), pN0, cM0, ER+, PR+) - Signed by Gardenia Phlegm, NP on 11/24/2019   07/08/2019 Surgery   Left lumpectomy ([place]) ([id number]): DCIS, grade 1, 0.2 cm. ER and PR positive. Negative margins. No lymph nodes examined.   08/19/2019 - 10/06/2019 Radiation Therapy   The patient initially  received a dose of 42.56 Gy in 16 fractions to the breast using whole-breast tangent fields. This was delivered using a 3-D conformal technique. The pt received a boost delivering an additional 8 Gy in 4 fractions using a electron boost with 37mV electrons. The total dose was 50.56 Gy.   10/2019 -  Anti-estrogen oral therapy   Tamoxifen daily     INTERVAL HISTORY:  Ms. WSamsonto review her survivorship care plan detailing her treatment course for breast cancer, as well as monitoring long-term side effects of that treatment, education regarding health maintenance, screening, and overall wellness and health promotion.     Overall, Ms. WTrefryreports feeling moderately well.  She completed the survivorship survey (scanned into media).  She screened positive for distress, cognitive issues, fatigue, hot flashes at night that are tolerable, sleep disturbance, weight concerns.  She is up to date with PCP visits, colonoscopy, dermatology visits, bone density testing.    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  She is taking Tamoxifen daily and has mild hot flashes at night and manages this by sleeping with the fan on.  She notes that she believes her cognitive issues are related to not working, secondary to her fatigue.  She takes hydroxyzine at night and that helps her with her sleep.  She notes that she has a sleep study scheduled next week.  She is having some difficulty with weight loss.  She is exercising once a week due to her fatigue.  She struggles to do more.      REVIEW OF SYSTEMS:  Review of Systems  Constitutional:  Positive for fatigue. Negative for appetite change, chills and unexpected weight change.  HENT:   Negative for hearing loss, lump/mass and trouble swallowing.   Eyes: Negative for eye problems and icterus.  Respiratory: Negative for chest tightness, cough,  shortness of breath and wheezing.   Cardiovascular: Negative for chest pain, leg swelling and palpitations.  Gastrointestinal: Negative for abdominal distention, abdominal pain, constipation, diarrhea, nausea and vomiting.  Endocrine: Negative for hot flashes.  Genitourinary: Negative for difficulty urinating.   Musculoskeletal: Negative for arthralgias.  Skin: Negative for itching and rash.  Neurological: Negative for dizziness, extremity weakness, headaches and numbness.  Psychiatric/Behavioral: Negative for depression. The patient is not nervous/anxious.   Breast: Denies any new nodularity, masses, tenderness, nipple changes, or nipple discharge.      ONCOLOGY TREATMENT TEAM:  1. Surgeon:  Dr. Brantley Stage at [facility] 2. Medical Oncologist: Dr. Jana Hakim  3. Radiation Oncologist: Dr. Lisbeth Renshaw    PAST MEDICAL/SURGICAL HISTORY:  Past Medical History:  Diagnosis Date  . Abnormal uterine bleeding (AUB)   . Cancer ([place]) 04/2019   left breast DCIS  . Carpal tunnel syndrome   . Depression   . Family history of breast cancer   . HA (headache)   . Hypertension   . Migraines   . OSA (obstructive sleep apnea)    Mild OSA AHI 9.3/hr now on CPAP at 6CM H2O  . Sleep apnea    not used CPAP in over a month   Past Surgical History:  Procedure Laterality Date  . ABLATION    . BREAST LUMPECTOMY WITH RADIOACTIVE SEED LOCALIZATION Left 07/08/2019   Procedure: LEFT BREAST LUMPECTOMY X 3  WITH RADIOACTIVE SEED LOCALIZATION;  Surgeon: Erroll Luna, MD;  Location: [place];  Service: General;  Laterality: Left;  . BREAST RECONSTRUCTION Left 07/15/2019   Procedure: LEFT BREAST ONCOPLASTIC RECONSTRUCTION;  Surgeon: Irene Limbo, MD;  Location: [place];  Service: Plastics;  Laterality: Left;  . CESAREAN SECTION     x2  . DILATATION & CURETTAGE/HYSTEROSCOPY WITH MYOSURE N/A 06/04/2019   Procedure: DILATATION /HYSTEROSCOPY;  Surgeon: Janyth Pupa,  DO;  Location: [place];  Service: Gynecology;  Laterality: N/A;  . LAPAROSCOPY N/A 06/04/2019   Procedure: LAPAROSCOPY Diagnostic, Repair Uterine Perforation;  Surgeon: Janyth Pupa, DO;  Location: [place];  Service: Gynecology;  Laterality: N/A;  . MASTOPEXY Right 07/15/2019   Procedure: RIGHT BREAST MASTOPEXY;  Surgeon: Irene Limbo, MD;  Location: [place];  Service: Plastics;  Laterality: Right;  . tummy tuck       ALLERGIES:  Allergies  Allergen Reactions  . Imitrex [Sumatriptan] Other (See Comments)    Nauseated - Feel like passing out - lack of therapeutic effect.  . Hydrocil [Psyllium] Nausea And Vomiting  . Other     "some kind of pain medicine a long time ago made me feel nauseous"      CURRENT MEDICATIONS:  Outpatient Encounter Medications as of 12/23/2019  Medication Sig  . Cholecalciferol (VITAMIN D3) 125 MCG (5000 UT) CAPS Take 5,000 capsules by mouth.  . citalopram (CELEXA) 40 MG tablet Take 40 mg by mouth daily.  . clindamycin (CLEOCIN T) 1 % lotion APPLY A SMALL AMOUNT TO SKIN TWICE A DAY AS NEEDED  . hydrochlorothiazide (HYDRODIURIL) 25 MG tablet Take 25 mg by mouth daily.  . hydroquinone 4 % cream APPLY A SMALL AMOUNT TOPICALLY EVERY DAY TO THE SKIN  . hydrOXYzine (ATARAX/VISTARIL) 25 MG tablet Take  1 tablet (25 mg total) by mouth at bedtime as needed.  . Multiple Vitamins-Minerals (HAIR SKIN AND NAILS FORMULA PO) Take as directed  . nabumetone (RELAFEN) 500 MG tablet Take 500 mg by mouth 2 (two) times daily as needed.  . naproxen (NAPROSYN) 500 MG tablet TAKE 1 TABLET BY MOUTH EVERY 12 HOURS AS NEEDED FOR PAIN 30 DAYS  . omeprazole (PRILOSEC) 20 MG capsule Take 20 mg by mouth every morning.  . potassium chloride (KLOR-CON) 10 MEQ tablet Take 1 tablet (10 mEq total) by mouth daily.  . tamoxifen (NOLVADEX) 20 MG tablet Take 20 mg by mouth daily.  Marland Kitchen tretinoin (RETIN-A) 0.05 % cream Apply 2.99 application  topically as needed.  . vitamin B-12 (CYANOCOBALAMIN) 500 MCG tablet Take 500 mcg by mouth daily.  . [DISCONTINUED] buPROPion (WELLBUTRIN XL) 150 MG 24 hr tablet TAKE 1 TABLET BY MOUTH EVERY DAY IN THE MORNING  . [DISCONTINUED] hydrOXYzine (ATARAX/VISTARIL) 25 MG tablet Take 25 mg by mouth at bedtime.   No facility-administered encounter medications on file as of 12/23/2019.     ONCOLOGIC FAMILY HISTORY:  Family History  Problem Relation Age of Onset  . Hypertension Mother   . Breast cancer Sister 35  . Breast cancer Maternal Aunt        dx 50s-60s  . Lung cancer Maternal Uncle   . Breast cancer Other        MGM's sister  . Other Sister 5       MVA  . Breast cancer Other        Mother's maternal cousins - x3     GENETIC COUNSELING/TESTING:see above  SOCIAL HISTORY:  Social History   Socioeconomic History  . Marital status: Married    Spouse name: [name]  . Number of children: 2  . Years of education: college  . Highest education level: Not on file  Occupational History    Comment: [employer]  Tobacco Use  . Smoking status: Never Smoker  . Smokeless tobacco: Never Used  Substance and Sexual Activity  . Alcohol use: Yes    Alcohol/week: 0.0 standard drinks    Comment: social  . Drug use: No  . Sexual activity: Yes    Birth control/protection: Surgical    Comment: ablation  Other Topics Concern  . Not on file  Social History Narrative   Patient lives at home with her husband and two children.   Patient works full time for [employer].   Education college.   Right handed.   Caffeine two cup daily coffee.   Social Determinants of Health   Financial Resource Strain:   . Difficulty of Paying Living Expenses: Not on file  Food Insecurity:   . Worried About Charity fundraiser in the Last Year: Not on file  . Ran Out of Food in the Last Year: Not on file  Transportation Needs: No Transportation Needs  . Lack of Transportation (Medical): No  . Lack of Transportation  (Non-Medical): No  Physical Activity:   . Days of Exercise per Week: Not on file  . Minutes of Exercise per Session: Not on file  Stress:   . Feeling of Stress : Not on file  Social Connections:   . Frequency of Communication with Friends and Family: Not on file  . Frequency of Social Gatherings with Friends and Family: Not on file  . Attends Religious Services: Not on file  . Active Member of Clubs or Organizations: Not on file  . Attends  Club or Organization Meetings: Not on file  . Marital Status: Not on file  Intimate Partner Violence:   . Fear of Current or Ex-Partner: Not on file  . Emotionally Abused: Not on file  . Physically Abused: Not on file  . Sexually Abused: Not on file     OBSERVATIONS/OBJECTIVE:  BP (!) 159/71 (BP Location: Left Arm, Patient Position: Sitting)   Pulse 69   Temp 98.3 F (36.8 C) (Temporal)   Resp 20   Ht '5\' 4"'  (1.626 m)   Wt 183 lb 3.2 oz (83.1 kg)   SpO2 100%   BMI 31.45 kg/m  GENERAL: Patient is a well appearing female in no acute distress HEENT:  Sclerae anicteric.  Mask in place. Neck is supple.  NODES:  No cervical, supraclavicular, or axillary lymphadenopathy palpated.  BREAST EXAM:  Left breast s/p lumpectomy and reduction along with radiation, no sign of local recurrence, right breast s/p reduction, benign. LUNGS:  Clear to auscultation bilaterally.  No wheezes or rhonchi. HEART:  Regular rate and rhythm. No murmur appreciated. ABDOMEN:  Soft, nontender.  Positive, normoactive bowel sounds. No organomegaly palpated. MSK:  No focal spinal tenderness to palpation. Full range of motion bilaterally in the upper extremities. EXTREMITIES:  No peripheral edema.   SKIN:  Clear with no obvious rashes or skin changes. No nail dyscrasia. NEURO:  Nonfocal. Well oriented.  Appropriate affect.   LABORATORY DATA:  None for this visit.  DIAGNOSTIC IMAGING:  None for this visit.      ASSESSMENT AND PLAN:  Ms.. Gaugh is a pleasant 54  y.o. female with Stage 0 left breast DCIS, ER+/PR+, diagnosed in 04/2019, treated with lumpectomy and adjuvant radiation therapy.  She presents to the Survivorship Clinic for our initial meeting and routine follow-up post-completion of treatment for breast cancer.    1. Stage 0 left breast cancer:  Ms. Vanliew is continuing to recover from definitive treatment for breast cancer. She will follow-up with her medical oncologist, Dr. Jana Hakim in 05/2020 with history and physical exam per surveillance protocol.  She will continue her anti-estrogen therapy with Tamoxifen. Thus far, she is tolerating the Tamoxifen well, with minimal side effects. . Her mammogram is due 03/2020; orders placed today. Today, a comprehensive survivorship care plan and treatment summary was reviewed with the patient today detailing her breast cancer diagnosis, treatment course, potential late/long-term effects of treatment, appropriate follow-up care with recommendations for the future, and patient education resources.  A copy of this summary, along with a letter will be sent to the patient's primary care provider via mail/fax/In Basket message after today's visit.    2. Positive distress screen: She completed QOL-CSV screen and will f/u with Maximino Greenland based on those results.    3.  Fatigue:  She notes that her thyroid is being managed by her PCP.  I placed some additional orders for b12 level, vitamin d level, CBC and CMET.  We talked about how ginseng may help with her fatigue, in addition to small amounts of exercise.  I think she likely has sleep apnea, and undergoing a sleep eval and starting to use a CPAP will help this as well.    4.  Sleep Pattern Disturbance: She is taking hydroxyzine at night which helps, and also is sleeping with a fan on so the hot flashes at night are tolerable.  She will f/u with sleep study.    5. Cognitive Dysfunction: I reviewed with Josely that I believe this is related to  numbers two, three and  four.  Once we get those straightened out, if she is still having issues, she will call me and I will place a referral to Dr. Mickeal Skinner.    6. Bone health:  Given Ms. Keidel's age/history of breast cancer, she is at risk for bone demineralization.  She underwent bone density testing in 07/2019 that showed mild osteopenia with a t score of -1.1.  I reviewed with her that tamoxifen has a protective effect on the bones, and I would be ok with her undergoing bone density testing in 2-5 years.  She was given education on specific activities to promote bone health.  7. Cancer screening:  Due to Ms. Andrades's history and her age, she should receive screening for skin cancers, colon cancer, and gynecologic cancers.  The information and recommendations are listed on the patient's comprehensive care plan/treatment summary and were reviewed in detail with the patient.    8. Health maintenance and wellness promotion: Ms. Aday was encouraged to consume 5-7 servings of fruits and vegetables per day. We reviewed the "Nutrition Rainbow" handout, as well as the handout "Take Control of Your Health and Reduce Your Cancer Risk" from the [place].  She was also encouraged to engage in moderate to vigorous exercise for 30 minutes per day most days of the week. We discussed the [employer] fitness program, which is designed for cancer survivors to help them become more physically fit after cancer treatments.  She was instructed to limit her alcohol consumption and continue to abstain from tobacco use.     9. Support services/counseling: It is not uncommon for this period of the patient's cancer care trajectory to be one of many emotions and stressors.  We discussed how this can be increasingly difficult during the times of quarantine and social distancing due to the COVID-19 pandemic.   She was given information regarding our available services and encouraged to contact me with any questions or for help  enrolling in any of our support group/programs.    Follow up instructions:    -Return to cancer center in 05/2020  -Mammogram due in 03/2020 -Labs today  She knows to call for any questions that may arise between now and her next appointment.  We are happy to see her sooner if needed.  Total encounter time: 45 minutes*  Wilber Bihari, NP 12/23/19 3:49 PM Medical Oncology and Hematology [facility] [place], [place] [address] Tel. [phone]    Fax. [phone]  *Total Encounter Time as defined by the Centers for Medicare and Medicaid Services includes, in addition to the face-to-face time of a patient visit (documented in the note above) non-face-to-face time: obtaining and reviewing outside history, ordering and reviewing medications, tests or procedures, care coordination (communications with other health care professionals or caregivers) and documentation in the medical record.

## 2019-12-24 ENCOUNTER — Telehealth: Payer: Self-pay

## 2019-12-24 ENCOUNTER — Other Ambulatory Visit: Payer: Self-pay

## 2019-12-24 DIAGNOSIS — C50312 Malignant neoplasm of lower-inner quadrant of left female breast: Secondary | ICD-10-CM

## 2019-12-24 DIAGNOSIS — Z170 Estrogen receptor positive status [ER+]: Secondary | ICD-10-CM

## 2019-12-24 NOTE — Telephone Encounter (Signed)
Spoke with pt by phone and gave lab results.  Scheduled pt to come back for labs on 4/7.  Pt aware of appt date, time. Orders placed for CBC per Wilber Bihari, NP

## 2019-12-25 ENCOUNTER — Encounter: Payer: Self-pay | Admitting: Adult Health

## 2019-12-25 ENCOUNTER — Encounter: Payer: Self-pay | Admitting: Licensed Clinical Social Worker

## 2019-12-25 NOTE — Progress Notes (Signed)
[facility] Quality of Life Screening Clinical Social Work  Clinical Social Work was referred by survivorship quality of life screening protocol.  The patient scored a 17.72 on the Quality of Life/ Cancer Survivor (QOL-CS) scale which indicates low quality of life.   Clinical Social Worker contacted patient by phone to assess for needs. Based on screener and patient report, fatigue is major concern. 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 Trying to exercise some on the elliptical but it is difficult. She also cares for her 35 yo son. She has friends but does not tell them much about her concerns. She is taking antidepressant medication (Celexa) but has not started therapy yet. CSW provided supportive counseling today. Discussed options such as Qigong to help with energy, pacing activities, and how to connect to therapy.     Follow up plan: 1. Phone visit in 1-2 weeks to check status of therapy connection and fatigue levels 2. Follow-up QOL screen to be sent in 1 months.     STOISITS, MICHELLE E, LCSW

## 2019-12-29 ENCOUNTER — Other Ambulatory Visit (HOSPITAL_COMMUNITY): Payer: 59

## 2020-01-01 ENCOUNTER — Encounter (HOSPITAL_BASED_OUTPATIENT_CLINIC_OR_DEPARTMENT_OTHER): Payer: 59 | Admitting: Cardiology

## 2020-01-06 ENCOUNTER — Encounter: Payer: Self-pay | Admitting: Licensed Clinical Social Worker

## 2020-01-06 NOTE — Progress Notes (Signed)
[facility] CSW Progress Note  Clinical Education officer, museum contacted patient by phone for survivorship quality of life follow-up. Per Ms. Stefanik, mood is slowly getting better. She is also working on her fatigue by making accommodations to complete tasks (like sitting while cooking) which is helping her do more. She has made an appointment with a therapist for May and is still looking to see if someone else has sooner availability. Sleep study/ CPAP rescheduled to next week.   Overall, making small improvements even though still frustrated that she is fatigued.  CSW encouraged patient to call back with any further needs. Will send follow-up quality of life screener in about 1 month.   Edwinna Areola Moana Munford, LCSW

## 2020-01-13 ENCOUNTER — Other Ambulatory Visit (HOSPITAL_COMMUNITY)
Admission: RE | Admit: 2020-01-13 | Discharge: 2020-01-13 | Disposition: A | Discharge status: Home / Self Care | Payer: 59 | Source: Ambulatory Visit | Attending: Cardiology | Admitting: Cardiology

## 2020-01-13 DIAGNOSIS — Z01812 Encounter for preprocedural laboratory examination: Secondary | ICD-10-CM | POA: Diagnosis not present

## 2020-01-13 DIAGNOSIS — Z20822 Contact with and (suspected) exposure to covid-19: Secondary | ICD-10-CM | POA: Diagnosis not present

## 2020-01-13 LAB — SARS CORONAVIRUS 2 (TAT 6-24 HRS): SARS Coronavirus 2: NEGATIVE

## 2020-01-16 ENCOUNTER — Ambulatory Visit (HOSPITAL_BASED_OUTPATIENT_CLINIC_OR_DEPARTMENT_OTHER): Payer: 59 | Attending: Cardiology | Admitting: Cardiology

## 2020-01-16 ENCOUNTER — Other Ambulatory Visit: Payer: Self-pay

## 2020-01-16 DIAGNOSIS — G4733 Obstructive sleep apnea (adult) (pediatric): Secondary | ICD-10-CM | POA: Insufficient documentation

## 2020-01-19 NOTE — Procedures (Signed)
    Patient Name: Kara [name], Kara [name] Date: 01/16/2020 Gender: Female D.O.B: [birth year] Age (years): 46 Referring Provider: Fransico Him MD, ABSM Height (inches): 65 Interpreting Physician: Fransico Him MD, ABSM Weight (lbs): 183 RPSGT: Earney Hamburg BMI: 31 MRN: [id number] Neck Size: 15.00  CLINICAL INFORMATION The patient is referred for a CPAP titration to treat sleep apnea.  SLEEP STUDY TECHNIQUE As per the AASM Manual for the Scoring of Sleep and Associated Events v2.3 (April 2016) with a hypopnea requiring 4% desaturations.  The channels recorded and monitored were frontal, central and occipital EEG, electrooculogram (EOG), submentalis EMG (chin), nasal and oral airflow, thoracic and abdominal wall motion, anterior tibialis EMG, snore microphone, electrocardiogram, and pulse oximetry. Continuous positive airway pressure (CPAP) was initiated at the beginning of the study and titrated to treat sleep-disordered breathing.  MEDICATIONS Medications self-administered by patient taken the night of the study : N/A  TECHNICIAN COMMENTS Comments added by technician: NONE Comments added by scorer: N/A  RESPIRATORY PARAMETERS Optimal PAP Pressure (cm): 8  AHI at Optimal Pressure (/hr):0.0 Overall Minimal O2 (%):95.0  Supine % at Optimal Pressure (%):100 Minimal O2 at Optimal Pressure (%): 95.0   SLEEP ARCHITECTURE The study was initiated at 11:21:21 PM and ended at 5:25:27 AM.  Sleep onset time was 4.8 minutes and the sleep efficiency was 91.4%. The total sleep time was 332.8 minutes.  The patient spent 2.0% of the night in stage N1 sleep, 83.9% in stage N2 sleep, 0.0% in stage N3 and 14.1% in REM.Stage REM latency was 53.0 minutes  Wake after sleep onset was 26.5. Alpha intrusion was absent. Supine sleep was 98.50%.  CARDIAC DATA The 2 lead EKG demonstrated sinus rhythm. The mean heart rate was 55.7 beats per minute. Other EKG findings include: None.  LEG MOVEMENT  DATA The total Periodic Limb Movements of Sleep (PLMS) were 0. The PLMS index was 0.0. A PLMS index of <15 is considered normal in adults.  IMPRESSIONS - The optimal PAP pressure was 8 cm of water. - Central sleep apnea was not noted during this titration (CAI = 0.0/h). - Significant oxygen desaturations were not observed during this titration (min O2 = 95.0%). - The patient snored with soft snoring volume during this titration study. - No cardiac abnormalities were observed during this study. - Clinically significant periodic limb movements were not noted during this study. Arousals associated with PLMs were rare.  DIAGNOSIS - Obstructive Sleep Apnea (327.23 [G47.33 ICD-10])  RECOMMENDATIONS - Trial of CPAP therapy on 8 cm H2O with a Small size Philips Respironics Nasal Pillow Mask Nuance Pro Gel mask and heated humidification. - Avoid alcohol, sedatives and other CNS depressants that may worsen sleep apnea and disrupt normal sleep architecture. - Sleep hygiene should be reviewed to assess factors that may improve sleep quality. - Weight management and regular exercise should be initiated or continued. - Return to [facility] for re-evaluation after 8 weeks of therapy  [Electronically signed] 01/19/2020 06:23 PM  Fransico Him MD, [place], American Board of Sleep Medicine   NPI: [id number]

## 2020-01-21 ENCOUNTER — Other Ambulatory Visit: Payer: Self-pay

## 2020-01-21 ENCOUNTER — Inpatient Hospital Stay: Payer: 59 | Attending: Oncology

## 2020-01-21 ENCOUNTER — Inpatient Hospital Stay: Payer: 59

## 2020-01-21 DIAGNOSIS — Z170 Estrogen receptor positive status [ER+]: Secondary | ICD-10-CM | POA: Insufficient documentation

## 2020-01-21 DIAGNOSIS — C50312 Malignant neoplasm of lower-inner quadrant of left female breast: Secondary | ICD-10-CM | POA: Diagnosis not present

## 2020-01-21 LAB — CBC WITH DIFFERENTIAL (CANCER CENTER ONLY)
Abs Immature Granulocytes: 0.02 10*3/uL (ref 0.00–0.07)
Basophils Absolute: 0 10*3/uL (ref 0.0–0.1)
Basophils Relative: 0 %
Eosinophils Absolute: 0 10*3/uL (ref 0.0–0.5)
Eosinophils Relative: 1 %
HCT: 34.4 % — ABNORMAL LOW (ref 36.0–46.0)
Hemoglobin: 11.3 g/dL — ABNORMAL LOW (ref 12.0–15.0)
Immature Granulocytes: 1 %
Lymphocytes Relative: 24 %
Lymphs Abs: 0.7 10*3/uL (ref 0.7–4.0)
MCH: 31.6 pg (ref 26.0–34.0)
MCHC: 32.8 g/dL (ref 30.0–36.0)
MCV: 96.1 fL (ref 80.0–100.0)
Monocytes Absolute: 0.4 10*3/uL (ref 0.1–1.0)
Monocytes Relative: 14 %
Neutro Abs: 1.8 10*3/uL (ref 1.7–7.7)
Neutrophils Relative %: 60 %
Platelet Count: 279 10*3/uL (ref 150–400)
RBC: 3.58 MIL/uL — ABNORMAL LOW (ref 3.87–5.11)
RDW: 13.2 % (ref 11.5–15.5)
WBC Count: 3 10*3/uL — ABNORMAL LOW (ref 4.0–10.5)
nRBC: 0 % (ref 0.0–0.2)

## 2020-01-22 ENCOUNTER — Telehealth: Payer: Self-pay | Admitting: *Deleted

## 2020-01-22 NOTE — Telephone Encounter (Signed)
-----   Message from Sueanne Margarita, MD sent at 01/19/2020  6:27 PM EDT ----- Please let patient know that they had a successful PAP titration and let DME know that orders are in EPIC.  Please set up 8 week OV with me.

## 2020-01-22 NOTE — Telephone Encounter (Signed)
Informed patient of titration results and verbalized understanding was indicated. Patient understands her titration study showed they had a successful PAP titration and let DME know that orders are in EPIC. Please set up 8 week OV with me.   Upon patient request DME selection is [facility]. Patient understands she will be contacted by [facility] to set up her cpap. Patient understands to call if [facility] does not contact her with new setup in a timely manner. Patient understands they will be called once confirmation has been received from [facility] that they have received their new machine to schedule 10 week follow up appointment.  [facility] notified of new cpap order  Please add to airview Patient was grateful for the call and thanked me.

## 2020-02-24 ENCOUNTER — Encounter: Payer: Self-pay | Admitting: Adult Health

## 2020-02-25 ENCOUNTER — Encounter
Payer: No Typology Code available for payment source | Attending: Obstetrics & Gynecology | Admitting: Skilled Nursing Facility1

## 2020-02-25 ENCOUNTER — Other Ambulatory Visit: Payer: Self-pay

## 2020-02-25 ENCOUNTER — Encounter: Payer: Self-pay | Admitting: Skilled Nursing Facility1

## 2020-02-25 ENCOUNTER — Encounter: Payer: Self-pay | Admitting: Licensed Clinical Social Worker

## 2020-02-25 DIAGNOSIS — E669 Obesity, unspecified: Secondary | ICD-10-CM | POA: Insufficient documentation

## 2020-02-25 NOTE — Progress Notes (Signed)
[facility] Quality of Life Screening Clinical Social Work  Clinical Social Work received follow-up Quality of Life Screener (QOL-CS) back from patient.   Patient scored a 16.2 which indicates low quality of life. This is 1.52 lower than initial score indicating a decreased quality of life. Patient is continuing medical interventions (per chart) of sleep studies, CPAP, and nutrition appointments. She is also starting with new therapist who she will see regularly.  Patient can continue to access support services, including support groups and resource referrals, as needed.     Xavier Munger E, LCSW

## 2020-02-25 NOTE — Progress Notes (Signed)
  Assessment:  Primary concerns today: weight loss.   Pt states se is dealing with fatigue and depression stating the depression is due to how her work treated her during her breast cancer diagnosis. Pt states she is trying to find a new job but it has been hard. Pt state se is working on her PhD. Pt states she is seeking a Education officer, community. Pt state she is not working right due to fatigue. Pt states she wears her C-PAP nightly. Pt states she has a headache weekly. Pt has a hx of breast cancer which was treated and in remission. Pt states her usual weight as an adult is 170 pounds. 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   Body Composition Scale 02/25/2020  Current Body Weight 185.6  Total Body Fat % 37.8  Visceral Fat 11  Fat-Free Mass % 62.1   Total Body Water % 45.5  Muscle-Mass lbs 30.4  BMI 31.3  Body Fat Displacement          Torso  lbs 43.3         Left Leg  lbs 8.6         Right Leg  lbs 8.6         Left Arm  lbs 4.3         Right Arm   lbs 4.3    MEDICATIONS: see list   DIETARY INTAKE:  Usual eating pattern includes 2 meals and 0 snacks per day.  Everyday foods include none stated.  Avoided foods include none stated  24-hr recall: wakes around 7am out of bed 10 B ( 11-12 AM): 2 boiled eggs + coffee Snk ( AM):  L ( PM):  Snk ( PM):  D (7-8 PM):  fast food Snk ( PM):  Beverages: coffee + creamer,   Usual physical activity: ADL's  Estimated energy needs: 1600 calories 180 g carbohydrates 120 g protein 44 g fat  Progress Towards Goal(s):  In progress.    Intervention:  Nutrition Counseling. Dietitian educated pt on healthy meals within the context of weight loss and depression. Dietitian advised pt to work with a Education officer, community.   Goals: -Talk to the hubs about how you can support one another -Try stretching in bed when you first wake up with deep breath -Put a brightly colored post-it on your  elliptical to remind you to get on in the morning  -Aim to eat at least every 5 hours: do not worry so much about what it is just yet Try some plant based options  -Log everything you are putting into your mouth for at least 3 days before your next visit  Teaching Method Utilized:  Visual Auditory Hands on  Handouts given during visit include:  Detailed MyPlate  Barriers to learning/adherence to lifestyle change: stress/depression  Demonstrated degree of understanding via:  Teach Back   Monitoring/Evaluation:  Dietary intake, exercise, and body weight prn.

## 2020-02-27 ENCOUNTER — Other Ambulatory Visit: Payer: Self-pay

## 2020-03-01 ENCOUNTER — Encounter: Payer: Self-pay | Admitting: Oncology

## 2020-03-01 MED ORDER — HYDROXYZINE HCL 25 MG PO TABS: 25.0000 mg | ORAL_TABLET | Freq: Every evening | ORAL | 0 refills | Status: DC | PRN

## 2020-03-03 ENCOUNTER — Other Ambulatory Visit: Payer: Self-pay

## 2020-03-03 ENCOUNTER — Ambulatory Visit (INDEPENDENT_AMBULATORY_CARE_PROVIDER_SITE_OTHER): Payer: 59 | Admitting: Clinical

## 2020-03-03 DIAGNOSIS — F322 Major depressive disorder, single episode, severe without psychotic features: Secondary | ICD-10-CM | POA: Diagnosis not present

## 2020-03-03 DIAGNOSIS — F418 Other specified anxiety disorders: Secondary | ICD-10-CM

## 2020-03-03 NOTE — Progress Notes (Signed)
Virtual Visit via Video Note  I connected with Kara [name] on 03/03/20 at  9:00 AM EDT by a video enabled telemedicine application and verified that I am speaking with the correct person using two identifiers.  Location: Patient: Home Provider: Office   I discussed the limitations of evaluation and management by telemedicine and the availability of in person appointments. The patient expressed understanding and agreed to proceed.     Comprehensive Clinical Assessment (CCA) Note  03/03/2020 Kara [name] [id number]  Visit Diagnosis:      ICD-10-CM   1. 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  (See scanned document in Chart Review)  CCA Part Two A  Intake/Chief Complaint:  CCA Intake With Chief Complaint CCA Part Two Date: 03/03/20 Chief Complaint/Presenting Problem: The patient notes, " I believe i am suffering from Depression i am not sure if this is from extra stress or from my job, my PCP has put me on medication to help with my mood". Collateral Involvement: None Individual's Strengths: Helping others, going above and beyond, communications skills, i am a people person Individual's Preferences: Spending time with my son, i am family oriented, i use to work out everyday and i like sports. Individual's Abilities: Education, teaching others, and being independant and self sufficiant Type of Services Patient Feels Are Needed: Medication Therapy and Individual OPT Initial Clinical Notes/Concerns: The patients PCP started the patient on Wellbutrin to assist with mood.  Mental Health Symptoms Depression:  Depression: Change in energy/activity, Difficulty Concentrating, Fatigue, Irritability, Sleep (too much or little), Tearfulness, Hopelessness, Worthlessness, Weight gain/loss  Mania:  Mania: N/A  Anxiety:   Anxiety: Difficulty  concentrating, Fatigue, Irritability, Restlessness, Sleep, Worrying, Tension  Psychosis:  Psychosis: N/A  Trauma:  Trauma: N/A  Obsessions:  Obsessions: N/A  Compulsions:  Compulsions: N/A  Inattention:  Inattention: N/A  Hyperactivity/Impulsivity:  Hyperactivity/Impulsivity: N/A  Oppositional/Defiant Behaviors:  Oppositional/Defiant Behaviors: N/A  Borderline Personality:  Emotional Irregularity: N/A  Other Mood/Personality Symptoms:  Other Mood/Personality Symtpoms: None noted   Mental Status Exam Appearance and self-care  Stature:  Stature: Average  Weight:  Weight: Overweight  Clothing:  Clothing: Casual  Grooming:  Grooming: Normal  Cosmetic use:     Posture/gait:  Posture/Gait: Normal  Motor activity:  Motor Activity: Not Remarkable  Sensorium  Attention:  Attention: Normal  Concentration:  Concentration: Anxiety interferes  Orientation:  Orientation: X5  Recall/memory:  Recall/Memory: Defective in short-term  Affect and Mood  Affect:  Affect: Depressed  Mood:  Mood: Depressed  Relating  Eye contact:  Eye Contact: Normal  Facial expression:  Facial Expression: Depressed  Attitude toward examiner:  Attitude Toward Examiner: Cooperative  Thought and Language  Speech flow: Speech Flow: Normal  Thought content:  Thought Content: Appropriate to mood and circumstances  Preoccupation:  Preoccupations: Other (Comment)(None noted)  Hallucinations:  Hallucinations: Other (Comment)(None noted)  Organization:   Nurse, mental health of Knowledge:  Fund of Knowledge: Average  Intelligence:  Intelligence: Average  Abstraction:  Abstraction: Normal  Judgement:  Judgement: Normal  Reality Testing:  Reality Testing: Realistic  Insight:  Insight: Good  Decision Making:  Decision Making: Normal  Social Functioning  Social Maturity:  Social Maturity: Responsible  Social Judgement:  Social Judgement: Normal  Stress  Stressors:  Stressors: Family conflict,  Illness(Cancer)  Coping Ability:  Coping Ability: Normal  Skill Deficits:   None noted   Supports:   family    Family and Psychosocial History: Family history Marital status: Married Number of Years Married: 51 What types of issues is patient dealing with in the relationship?: The patient identifies issues in her current relationship with her husband. The patient notes i have been more irritable, we are not sexually active, i have been told i find fault in everything. Additional relationship information: No Additional Are you sexually active?: No What is your sexual orientation?: Heterosexual Has your sexual activity been affected by drugs, alcohol, medication, or emotional stress?: NA Does patient have children?: Yes How many children?: 2 How is patient's relationship with their children?: The patient notes she has 2 sons. The patient notes, " With my older son its good he doesnt live her. My younger son who lives with me says i fuss all day everyday".  Childhood History:  Childhood History By whom was/is the patient raised?: Mother Additional childhood history information: No Additional Description of patient's relationship with caregiver when they were a child: The patient notes, " I had a good relationship with my mother as a child". Patient's description of current relationship with people who raised him/her: The patient notes, " I have a good relationship with my Mother". How were you disciplined when you got in trouble as a child/adolescent?: The patient notes, " I would just get fussed at". Does patient have siblings?: Yes Number of Siblings: 2 Description of patient's current relationship with siblings: The patient notes having 1 brother and 1 sister. The patient notes, " I have a really good relationship with my siblings". Did patient suffer any verbal/emotional/physical/sexual abuse as a child?: No Did patient suffer from severe childhood neglect?: No Has patient ever been  sexually abused/assaulted/raped as an adolescent or adult?: No Was the patient ever a victim of a crime or a disaster?: No Witnessed domestic violence?: No Has patient been effected by domestic violence as an adult?: No  CCA Part Two B  Employment/Work Situation: Employment / Work Situation Employment situation: On disability Why is patient on disability: The patient notes she is currently out on disability due to medical issues due to work stress and cancer How long has patient been on disability: The patient notes the dr took her out of work in September 2020. The patient notes it was around this time she had to go through radiation treatment. 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 Did You Attend Graduate School?: Yes What is Your Post Graduate Degree?: MBA What Was Your Major?: [employer] Did You Have Any Special Interests In School?: NA Did You Have An Individualized Education Program (IIEP): No Did You Have Any Difficulty At School?: No  Religion: Religion/Spirituality Are You A Religious Person?: Yes What is Your Religious Affiliation?: [place] How Might This Affect Treatment?: NA  Leisure/Recreation: Leisure / Recreation Leisure and Hobbies: The patient notes, " I like Football, working out , traveling".  Exercise/Diet: Exercise/Diet Do You Exercise?: No Have You Gained  or Lost A Significant Amount of Weight in the Past Six Months?: No Do You Follow a Special Diet?: No Do You Have Any Trouble  Sleeping?: Yes Explanation of Sleeping Difficulties: The patient notes," I am having difficulty with both falling asleep and staying asleep".  CCA Part Two C  Alcohol/Drug Use: Alcohol / Drug Use Pain Medications: See pt chart Prescriptions: See pt chart Over the Counter: See pt chart History of alcohol / drug use?: No history of alcohol / drug abuse Longest period of sobriety (when/how long): NA                      CCA Part Three  ASAM's:  Six Dimensions of Multidimensional Assessment  Dimension 1:  Acute Intoxication and/or Withdrawal Potential:     Dimension 2:  Biomedical Conditions and Complications:     Dimension 3:  Emotional, Behavioral, or Cognitive Conditions and Complications:     Dimension 4:  Readiness to Change:     Dimension 5:  Relapse, Continued use, or Continued Problem Potential:     Dimension 6:  Recovery/Living Environment:      Substance use Disorder (SUD)    Social Function:  Social Functioning Social Maturity: Responsible Social Judgement: Normal  Stress:  Stress Stressors: Family conflict, Illness(Cancer) Coping Ability: Normal Patient Takes Medications The Way The Doctor Instructed?: Yes Priority Risk: Low Acuity  Risk Assessment- Self-Harm Potential: Risk Assessment For Self-Harm Potential Thoughts of Self-Harm: No current thoughts Method: No plan Availability of Means: No access/NA Additional Comments for Self-Harm Potential: No Current S/I  Risk Assessment -Dangerous to Others Potential: Risk Assessment For Dangerous to Others Potential Method: No Plan Availability of Means: No access or NA Intent: Vague intent or NA Notification Required: No need or identified person Additional Comments for Danger to Others Potential: No current H/I  DSM5 Diagnoses: Patient Active Problem List   Diagnosis Date Noted  . Genetic testing 05/30/2019  . Family history of breast cancer   . Malignant neoplasm of lower-inner quadrant of left  breast in female, estrogen receptor positive ([place]) 04/30/2019  . Obesity (BMI 30-39.9) 11/15/2014  . Headache 11/02/2014  . Migraine 11/02/2014  . CTS (carpal tunnel syndrome) 11/02/2014  . HA (headache)   . OSA (obstructive sleep apnea) 07/16/2014  . HTN (hypertension) 07/16/2014  . Heart murmur 07/16/2014    Patient Centered Plan: Patient is on the following Treatment Plan(s): Depression/Anxiety Recommendations for Services/Supports/Treatments: Recommendations for Services/Supports/Treatments Recommendations For Services/Supports/Treatments: Individual Therapy, Medication Management  Treatment Plan Summary: OP Treatment Plan Summary: The OPT therapist will work with the patient to reduce/eliminate the patients (Depression/Anxiety) symptoms as measured by having fewer than 2 episodes per week, as evidenced by the patients report  Referrals to Alternative Service(s): Referred to Alternative Service(s):   Place:   Date:   Time:    Referred to Alternative Service(s):   Place:   Date:   Time:    Referred to Alternative Service(s):   Place:   Date:   Time:    Referred to Alternative Service(s):   Place:   Date:   Time:     I discussed the assessment and treatment plan with the patient. The patient was provided an opportunity to ask questions and all were answered. The patient agreed with the plan and demonstrated an understanding of the instructions.   The patient was advised to call back or seek an in-person evaluation if the symptoms worsen or if the condition fails to improve as  anticipated.  I provided 60 minutes of non-face-to-face time during this encounter.  Lennox Grumbles, LCSW 03/03/2020

## 2020-03-05 ENCOUNTER — Encounter: Payer: Self-pay | Admitting: *Deleted

## 2020-03-08 ENCOUNTER — Ambulatory Visit (HOSPITAL_COMMUNITY): Payer: 59 | Admitting: Licensed Clinical Social Worker

## 2020-03-09 ENCOUNTER — Encounter: Payer: Self-pay | Admitting: Oncology

## 2020-03-17 ENCOUNTER — Ambulatory Visit (INDEPENDENT_AMBULATORY_CARE_PROVIDER_SITE_OTHER): Payer: No Typology Code available for payment source | Admitting: Clinical

## 2020-03-17 ENCOUNTER — Other Ambulatory Visit: Payer: Self-pay

## 2020-03-17 ENCOUNTER — Ambulatory Visit: Payer: 59 | Admitting: Skilled Nursing Facility1

## 2020-03-17 DIAGNOSIS — F322 Major depressive disorder, single episode, severe without psychotic features: Secondary | ICD-10-CM

## 2020-03-17 DIAGNOSIS — F418 Other specified anxiety disorders: Secondary | ICD-10-CM | POA: Diagnosis not present

## 2020-03-17 NOTE — Progress Notes (Signed)
Virtual Visit via Video Note  I connected with Kara [name] on 03/17/20 at 11:00 AM EDT by a video enabled telemedicine application and verified that I am speaking with the correct person using two identifiers.  Location: Patient: Home Provider: Office   I discussed the limitations of evaluation and management by telemedicine and the availability of in person appointments. The patient expressed understanding and agreed to proceed.     THERAPIST PROGRESS NOTE  Session Time: 11:00AM-11:40AM  Participation Level: Active  Behavioral Response: CasualAlertDepressed  Type of Therapy: Individual Therapy  Treatment Goals addressed: Coping  Interventions: CBT, DBT, Motivational Interviewing and Supportive  Summary: Kara [name] is a 54 y.o. female who presents with Depression and Anxiety. The OPT therapist worked with the patient for her initial session. The OPT therapist utilized Motivational Interviewing to assist in creating therapeutic repore. The patient in the session was engaged and work in collaboration giving feedback about her triggers and symptoms over the past few weeks including finding out earlier in the day of the passing away of a family member. The OPT therapist utilized Cognitive Behavioral Therapy through cognitive restructuring as well as worked with the patient on coping strategies to assist in management of mood and anxiety (including physical exercise). The OPT therapist inquired for holistic care about the patients medication therapy (The patient is scheduled to see psychiatrist on July 1st,2021).   Suicidal/Homicidal: Nowithout intent/plan  Therapist Response: The OPT therapist worked with the patient for the patients initial scheduled session. The patient was engaged in her session and gave feedback in relation to triggers, symptoms, and behavior responses over the past few weeks. The OPT therapist worked with the patient utilizing an in session  Cognitive Behavioral Therapy exercise. The patient was responsive in the session and verbalized, " I am willing to try the techniques we talked about and follow the advice of both you (OPT therapist) and my psychiatrist". The OPT therapist will continue treatment work with the patient in her next scheduled session.  Plan: Return again in 3/4 weeks.  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 The patient was provided an opportunity to ask questions and all were answered. The patient agreed with the plan and demonstrated an understanding of the instructions.   The patient was advised to call back or seek an in-person evaluation if the symptoms worsen or if the condition fails to improve as anticipated.  I provided 40 minutes of non-face-to-face time during this encounter.  Lennox Grumbles, LCSW 03/17/2020

## 2020-04-15 ENCOUNTER — Telehealth (INDEPENDENT_AMBULATORY_CARE_PROVIDER_SITE_OTHER): Payer: No Typology Code available for payment source | Admitting: Psychiatry

## 2020-04-15 ENCOUNTER — Encounter (HOSPITAL_COMMUNITY): Payer: Self-pay | Admitting: Psychiatry

## 2020-04-15 ENCOUNTER — Other Ambulatory Visit: Payer: Self-pay

## 2020-04-15 DIAGNOSIS — F329 Major depressive disorder, single episode, unspecified: Secondary | ICD-10-CM | POA: Diagnosis not present

## 2020-04-15 MED ORDER — FLUOXETINE HCL 20 MG PO CAPS: ORAL_CAPSULE | ORAL | 0 refills | Status: DC

## 2020-04-15 NOTE — Progress Notes (Signed)
Psychiatric Initial Adult Assessment   Patient Identification: [name] MRN:  [id number] Date of Evaluation:  04/15/2020 Referral Source: [name] LCSW Chief Complaint:  Depression, anxiety, fatigue, lack of focus.  Interview was conducted using videoconferencing application and I verified that I was speaking with the correct person using two identifiers. I discussed the limitations of evaluation and management by telemedicine and  the availability of in person appointments. Patient expressed understanding and agreed to proceed. Patient location - home; physician - home office.  Visit Diagnosis:    ICD-10-CM   1. Major depressive disorder, single episode with anxious distress  F32.9     History of Present Illness:  Kara [name] is a 54 yo MAAF with no past psychiatric history who has been referred for medication management of depression/anxiety after being seen by [name] LCSW. She reports approximately a year loing problems with feeling depressed, irritable, excessively worried, tires, having difficulty falling asleep, problems with concentration and remembering things. She feels woryless, unsure of herself. She has no suicidal or homicidal thoughts, no hallucinations, delusions. She has no prior history of depression, no hx of mania. She does nt abuse alcohol or drugs. She  Worries about her health ever since she was diagnosed with breast cancer. She had a surgery in September 2020, went through radiation therapy and is currently on Tamoxifen. She was stated on citalopram by her PCP 6 months ago - currently on 40 mg. She was also placed on disability given her ongoing fatigue, cognitive and emotional problems. She works as a Radiation protection practitioner for [employer]. Long term disability was denied but it is under appeal review. [place] does not see any clear improvement in her depressive symptoms with citalopram. She noted that since she has been on that medication she feels  more restless (particularly noticeable in the evening when she goes to bed) and started to gain some weight. She has never taken any psychotropic medications earlier (briefly on hydroxyzine for sleep).  There is no family psychiatric history.  Medical hx: besides breast CA she has a hx of HA, hypertension, obstructive sleep apnea (on CPAP).   Associated Signs/Symptoms: Depression Symptoms:  depressed mood, anhedonia, fatigue, feelings of worthlessness/guilt, difficulty concentrating,  anxiety (Hypo) Manic Symptoms:  Irritable Mood, Anxiety Symptoms:  Excessive Worry, Psychotic Symptoms:  None PTSD Symptoms: NA  Past Psychiatric History: None  Previous Psychotropic Medications: No   Substance Abuse History in the last 12 months:  No.  Consequences of Substance Abuse: NA  Past Medical History:  Past Medical History:  Diagnosis Date  . Abnormal uterine bleeding (AUB)   . 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 Carpal tunnel syndrome   . Depression   . Family history of breast cancer   . HA (headache)   . Hypertension   . Migraines   . OSA (obstructive sleep apnea)    Mild OSA AHI 9.3/hr now on CPAP at 6CM H2O  . Sleep apnea    not used CPAP in over a month    Past Surgical History:  Procedure Laterality Date  . ABLATION    . BREAST LUMPECTOMY WITH RADIOACTIVE SEED LOCALIZATION Left 07/08/2019   Procedure: LEFT BREAST LUMPECTOMY X 3  WITH RADIOACTIVE SEED LOCALIZATION;  Surgeon: Erroll Luna, MD;  Location: [place];  Service: General;  Laterality: Left;  . BREAST RECONSTRUCTION Left 07/15/2019   Procedure: LEFT BREAST ONCOPLASTIC RECONSTRUCTION;  Surgeon: Irene Limbo, MD;  Location: [place];  Service: Clinical cytogeneticist;  Laterality: Left;  . CESAREAN SECTION     x2  . DILATATION & CURETTAGE/HYSTEROSCOPY WITH MYOSURE N/A 06/04/2019   Procedure: DILATATION /HYSTEROSCOPY;  Surgeon: Janyth Pupa, DO;  Location: [facility];  Service: Gynecology;  Laterality: N/A;  . LAPAROSCOPY N/A 06/04/2019   Procedure: LAPAROSCOPY Diagnostic, Repair Uterine Perforation;  Surgeon: Janyth Pupa, DO;  Location: [facility];  Service: Gynecology;  Laterality: N/A;  . MASTOPEXY Right 07/15/2019   Procedure: RIGHT BREAST MASTOPEXY;  Surgeon: Irene Limbo, MD;  Location: [facility];  Service: Plastics;  Laterality: Right;  . tummy tuck      Family Psychiatric History: None.  Family History:  Family History  Problem Relation Age of Onset  . Hypertension Mother   . Breast cancer Sister 23  . Breast cancer Maternal Aunt        dx 50s-60s  . Lung cancer Maternal Uncle   . Breast cancer Other        MGM's sister  . Other Sister 66       MVA  . Breast cancer Other        Mother's maternal cousins - x3    Social History:   Social History   Socioeconomic History  . Marital status: Married    Spouse name: [name]  . Number of children: 2  . Years of education: college  . Highest education level: Not on file  Occupational History    Comment: [employer]  Tobacco Use  . Smoking status: Never Smoker  . Smokeless tobacco: Never Used  Vaping Use  . Vaping Use: Never used  Substance and Sexual Activity  . Alcohol use: Yes    Alcohol/week: 0.0 standard drinks    Comment: social  . Drug use: No  . Sexual activity: Yes    Birth control/protection: Surgical    Comment: ablation  Other Topics Concern  . Not on file  Social History Narrative   Patient lives at home with her husband and two children.   Patient works full time for [employer].stopped in 2015, then at [employer].   Education college.   Right handed.   Caffeine two cup daily coffee.   Social Determinants of Health   Financial Resource Strain:   . Difficulty of Paying Living Expenses:   Food Insecurity:   . Worried About Charity fundraiser in the Last Year:   . Arboriculturist in the Last Year:   Transportation Needs: No  Transportation Needs  . Lack of Transportation (Medical): No  . Lack of Transportation (Non-Medical): No  Physical Activity:   . Days of Exercise per Week:   . Minutes of Exercise per Session:   Stress:   . Feeling of Stress :   Social Connections:   . Frequency of Communication with Friends and Family:   . Frequency of Social Gatherings with Friends and Family:   . Attends Religious Services:   . Active Member of Clubs or Organizations:   . Attends Archivist Meetings:   Marland Kitchen Marital Status:     Additional Social History: Married, two sons (one lives with them). College educated, currently not working. No hx of trauma/abuse - please see Gershon Mussel note for additional details.  Allergies:   Allergies  Allergen Reactions  . Imitrex [Sumatriptan] Other (See Comments)    Nauseated - Feel like passing out - lack of therapeutic effect.  . Hydrocil [Psyllium] Nausea And Vomiting  .  Other     "some kind of pain medicine a long time ago made me feel nauseous"     Metabolic Disorder Labs: No results found for: HGBA1C, MPG No results found for: PROLACTIN No results found for: CHOL, TRIG, HDL, CHOLHDL, VLDL, LDLCALC No results found for: TSH  Therapeutic Level Labs: No results found for: LITHIUM No results found for: CBMZ No results found for: VALPROATE  Current Medications: Current Outpatient Medications  Medication Sig Dispense Refill  . Cholecalciferol (VITAMIN D3) 125 MCG (5000 UT) CAPS Take 5,000 capsules by mouth.    . clindamycin (CLEOCIN T) 1 % lotion APPLY A SMALL AMOUNT TO SKIN TWICE A DAY AS NEEDED    . FLUoxetine (PROZAC) 20 MG capsule Take 1 capsule (20 mg total) by mouth daily for 8 days, THEN 2 capsules (40 mg total) daily. 68 capsule 0  . hydrochlorothiazide (HYDRODIURIL) 25 MG tablet Take 25 mg by mouth daily.    . hydroquinone 4 % cream APPLY A SMALL AMOUNT TOPICALLY EVERY DAY TO THE SKIN    . hydrOXYzine (ATARAX/VISTARIL) 25 MG tablet Take 1 tablet  (25 mg total) by mouth at bedtime as needed. 30 tablet 0  . Multiple Vitamins-Minerals (HAIR SKIN AND NAILS FORMULA PO) Take as directed    . nabumetone (RELAFEN) 500 MG tablet Take 500 mg by mouth 2 (two) times daily as needed.    . naproxen (NAPROSYN) 500 MG tablet TAKE 1 TABLET BY MOUTH EVERY 12 HOURS AS NEEDED FOR PAIN 30 DAYS    . omeprazole (PRILOSEC) 20 MG capsule Take 20 mg by mouth every morning.    . potassium chloride (KLOR-CON) 10 MEQ tablet Take 1 tablet (10 mEq total) by mouth daily. 90 tablet 4  . tamoxifen (NOLVADEX) 20 MG tablet Take 20 mg by mouth daily.    Marland Kitchen tretinoin (RETIN-A) 0.05 % cream Apply 8.41 application topically as needed.    . vitamin B-12 (CYANOCOBALAMIN) 500 MCG tablet Take 500 mcg by mouth daily.     No current facility-administered medications for this visit.     Psychiatric Specialty Exam: Review of Systems  Constitutional: Positive for fatigue.  Psychiatric/Behavioral: Positive for decreased concentration. The patient is nervous/anxious.   All other systems reviewed and are negative.   There were no vitals taken for this visit.There is no height or weight on file to calculate BMI.  General Appearance: Casual and Fairly Groomed  Eye Contact:  Good  Speech:  Clear and Coherent and Normal Rate  Volume:  Normal  Mood:  Anxious and Depressed  Affect:  Full Range  Thought Process:  Goal Directed  Orientation:  Full (Time, Place, and Person)  Thought Content:  Rumination  Suicidal Thoughts:  No  Homicidal Thoughts:  No  Memory:  Immediate;   Fair Recent;   Fair Remote;   Good  Judgement:  Good  Insight:  Good  Psychomotor Activity:  Normal  Concentration:  Concentration: Fair  Recall:  [place] of Knowledge:Good  Language: Good  Akathisia:  Possible  Handed:  Right  AIMS (if indicated):  not done  Assets:  Communication Skills Desire for Improvement Housing Resilience  ADL's:  Intact  Cognition: WNL  Sleep:  Fair    Assessment and  Plan: 54 yo MAAF with no past psychiatric history who has been referred for medication management of depression/anxiety after being seen by [name] LCSW. She reports approximately a year loing problems with feeling depressed, irritable, excessively worried, tires, having difficulty falling  asleep, problems with concentration and remembering things. She feels woryless, unsure of herself. She has no suicidal or homicidal thoughts, no hallucinations, delusions. She has no prior history of depression, no hx of mania. She does nt abuse alcohol or drugs. She  Worries about her health ever since she was diagnosed with breast cancer. She had a surgery in September 2020, went through radiation therapy and is currently on Tamoxifen. She was stated on citalopram by her PCP 6 months ago - currently on 40 mg. She was also placed on disability given her ongoing fatigue, cognitive and emotional problems. She works as a Radiation protection practitioner for [employer]. Long term disability was denied but it is under appeal review. [place] does not see any clear improvement in her depressive symptoms with citalopram. She noted that since she has been on that medication she feels more restless (particularly noticeable in the evening when she goes to bed) and started to gain some weight.   Dx: Major depressive disorder single episode moderate  Plan: We will taper citalopram off and simultaneously start fluoxetine 20 mg x 8 days then increase to 40 mg daily. She will either continue counseling with [facility] or request appointment with a counselor in [place] office. WE may need to fill out another set of forms for disability (long term) - at this point she is too anxious, depressed, and distractible to  Be ble to adequately perform her job duties. When her emotional condition will stabilize is unclear at this point. Next appointment with me in 4 weeks. The plan was discussed with patient who had an opportunity to ask questions and  these were all answered. I spend 55 minutes in videoconferencing with the patient and devoted approximately 50% of this time to explanation of diagnosis, discussion of treatment options and med education.  Stephanie Acre, MD 7/1/20212:00 PM

## 2020-04-20 ENCOUNTER — Ambulatory Visit
Admission: RE | Admit: 2020-04-20 | Discharge: 2020-04-20 | Disposition: A | Discharge status: Home / Self Care | Payer: No Typology Code available for payment source | Source: Ambulatory Visit | Attending: Adult Health | Admitting: Adult Health

## 2020-04-20 ENCOUNTER — Other Ambulatory Visit: Payer: Self-pay

## 2020-04-20 DIAGNOSIS — Z170 Estrogen receptor positive status [ER+]: Secondary | ICD-10-CM

## 2020-04-20 HISTORY — DX: Personal history of irradiation: Z92.3

## 2020-04-27 ENCOUNTER — Telehealth: Payer: Self-pay | Admitting: *Deleted

## 2020-04-27 ENCOUNTER — Telehealth: Payer: Self-pay | Admitting: Cardiology

## 2020-04-27 NOTE — Telephone Encounter (Signed)
Patient needed a follow up appointment. Patient has a 10 week follow up appointment scheduled for 05/13/20. Patient understands she needs to keep this appointment for insurance compliance. Patient was grateful for the call and thanked me.

## 2020-04-27 NOTE — Telephone Encounter (Signed)
DME changed to [facility].

## 2020-04-27 NOTE — Telephone Encounter (Signed)
  Patient Consent for Virtual Visit         Kara [name] has provided verbal consent on 04/27/2020 for a virtual visit (video or telephone).   CONSENT FOR VIRTUAL VISIT FOR:  Kara [name]  By participating in this virtual visit I agree to the following:  I hereby voluntarily request, consent and authorize [facility] and its employed or contracted physicians, physician assistants, nurse practitioners or other licensed health care professionals (the Practitioner), to provide me with telemedicine health care services (the "Services") as deemed necessary by the treating Practitioner. I acknowledge and consent to receive the Services by the Practitioner via telemedicine. I understand that the telemedicine visit will involve communicating with the Practitioner through live audiovisual communication technology and the disclosure of certain medical information by electronic transmission. I acknowledge that I have been given the opportunity to request an in-person assessment or other available alternative prior to the telemedicine visit and am voluntarily participating in the telemedicine visit.  I understand that I have the right to withhold or withdraw my consent to the use of telemedicine in the course of my care at any time, without affecting my right to future care or treatment, and that the Practitioner or I may terminate the telemedicine visit at any time. I understand that I have the right to inspect all information obtained and/or recorded in the course of the telemedicine visit and may receive copies of available information for a reasonable fee.  I understand that some of the potential risks of receiving the Services via telemedicine include:  Marland Kitchen Delay or interruption in medical evaluation due to technological equipment failure or disruption; . Information transmitted may not be sufficient (e.g. poor resolution of images) to allow for appropriate medical decision making by  the Practitioner; and/or  . In rare instances, security protocols could fail, causing a breach of personal health information.  Furthermore, I acknowledge that it is my responsibility to provide information about my medical history, conditions and care that is complete and accurate to the best of my ability. I acknowledge that Practitioner's advice, recommendations, and/or decision may be based on factors not within their control, such as incomplete or inaccurate data provided by me or distortions of diagnostic images or specimens that may result from electronic transmissions. I understand that the practice of medicine is not an exact science and that Practitioner makes no warranties or guarantees regarding treatment outcomes. I acknowledge that a copy of this consent can be made available to me via my patient portal ([facility]), or I can request a printed copy by calling the office of [facility].    I understand that my insurance will be billed for this visit.   I have read or had this consent read to me. . I understand the contents of this consent, which adequately explains the benefits and risks of the Services being provided via telemedicine.  . I have been provided ample opportunity to ask questions regarding this consent and the Services and have had my questions answered to my satisfaction. . I give my informed consent for the services to be provided through the use of telemedicine in my medical care

## 2020-04-27 NOTE — Telephone Encounter (Signed)
Patient is calling to follow up in regards to status CPAP machine order. Please call to discuss.

## 2020-04-27 NOTE — Telephone Encounter (Signed)
Patient has a 10 week follow up appointment scheduled for 05/13/20. Patient understands she needs to keep this appointment for insurance compliance. Patient was grateful for the call and thanked me.

## 2020-04-29 ENCOUNTER — Telehealth (HOSPITAL_COMMUNITY): Payer: 59 | Admitting: Psychiatry

## 2020-05-08 ENCOUNTER — Other Ambulatory Visit (HOSPITAL_COMMUNITY): Payer: Self-pay | Admitting: Psychiatry

## 2020-05-11 ENCOUNTER — Ambulatory Visit (INDEPENDENT_AMBULATORY_CARE_PROVIDER_SITE_OTHER): Payer: No Typology Code available for payment source | Admitting: Licensed Clinical Social Worker

## 2020-05-11 ENCOUNTER — Other Ambulatory Visit: Payer: Self-pay

## 2020-05-11 ENCOUNTER — Other Ambulatory Visit: Payer: Self-pay | Admitting: Oncology

## 2020-05-11 DIAGNOSIS — F411 Generalized anxiety disorder: Secondary | ICD-10-CM

## 2020-05-11 DIAGNOSIS — F321 Major depressive disorder, single episode, moderate: Secondary | ICD-10-CM

## 2020-05-11 NOTE — Progress Notes (Signed)
THERAPIST PROGRESS NOTE   Session Time: 9:00am - 10:00am  Location: Patient: [facility]  Provider: [facility]    Participation Level: Active   Behavioral Response: Alert, casually dressed, neatly groomed, depressed mood/affect   Type of Therapy:  Individual Therapy   Treatment Goals addressed: Anxiety/depression management; Medication management and psychiatry appointments   Interventions: CBT, safety planning, depression and anxiety screening   Summary: Kara [name] is a 54 year old married African American female that presented for in person therapy today with diagnoses of Major Depressive Disorder, Single Episode, Moderate, and Generalized Anxiety Disorder.     Suicidal/Homicidal: None; without plan or intent.     Therapist Response: Clinician met with Kara [name] in person today for first therapy session together.  Clinician introduced self, and assessed Kara [name] for safety, sobriety, and medication compliance.  Kara [name] presented for appointment on time and was alert, oriented x5, with no evidence or self-report of active SI/HI or A/V H.  Kara [name] reported that she recently met with psychiatrist on 04/15/20 and was started on Fluoxetine, which has led to less tearfulness and irritability.  Kara [name] denied any abuse of alcohol or illicit substances.  Clinician inquired about Kara [name]'s current emotional ratings, as well as any significant changes in thoughts, feelings, or behavior since transition from original therapist.  Kara [name] reported scores of 6/10 for depression and 1-2/10 for anxiety, stating Work has been my biggest stressor.  I was diagnosed with cancer last July and had been written out of work by my doctor for a year, but recently my benefits got cut off and there has been some miscommunication.  Kara [name] reported that she struggles with anxiety a lot, and will ruminate on what if scenarios, which reinforce feelings of hopelessness and at times can make her think that she would be better off dead,  although she denies plan or intent.  Clinician discussed safety planning in session with Kara [name] today based upon this disclosure, including assessing for risk, coping skills that can be utilized in crisis, as well as supports to outreach if necessary.  Kara [name] reported that she finds various forms of cardio exercise, listening to positive music, cleaning/organizing the home, cooking, and football to be helpful coping activities to de-escalate crisis, and noted that she also has 2 close friends and a cousin she feels she can share anything with if these techniques prove ineffective.  Kara [name] reported that she would also voluntarily seek hospitalization to ensure safety if she began to develop intent or plan to harm herself.  Kara [name] reported that despite having no plan or intent, she does own a personal firearm.  Clinician discussed importance of safely securing any weapons in household, including relocating them outside of household if necessary for safekeeping with trusted supports.  Kara [name] reported that she would speak with husband today about hiding the firearm from her while she remains in treatment and follow safety plan closely.  Clinician also utilized PHQ9 and GAD 7 screenings today to gauge severity levels of Kara [name]'s depression and anxiety, and updated diagnoses after discussing results with Kara [name].  Interventions were effective, as evidenced by Kara [name] reporting that she found this first session to be very informative and helpful for clearing some things up.  She reported that she would plan to follow up again for next session in 2 weeks.  Clinician will continue to monitor.   Plan: Follow up again in 2 weeks.       Diagnosis: Major Depressive Disorder, Single Episode, Moderate, and Generalized Anxiety Disorder  [name], LCSW, LCAS 05/11/20

## 2020-05-13 ENCOUNTER — Telehealth (INDEPENDENT_AMBULATORY_CARE_PROVIDER_SITE_OTHER): Payer: No Typology Code available for payment source | Admitting: Cardiology

## 2020-05-13 ENCOUNTER — Encounter: Payer: Self-pay | Admitting: Cardiology

## 2020-05-13 ENCOUNTER — Other Ambulatory Visit: Payer: Self-pay

## 2020-05-13 VITALS — BP 136/68 | Ht 64.5 in | Wt 182.0 lb

## 2020-05-13 DIAGNOSIS — E669 Obesity, unspecified: Secondary | ICD-10-CM | POA: Diagnosis not present

## 2020-05-13 DIAGNOSIS — I10 Essential (primary) hypertension: Secondary | ICD-10-CM

## 2020-05-13 DIAGNOSIS — G4733 Obstructive sleep apnea (adult) (pediatric): Secondary | ICD-10-CM

## 2020-05-13 NOTE — Patient Instructions (Signed)

## 2020-05-13 NOTE — Progress Notes (Signed)
Virtual Visit via Telephone Note   This visit type was conducted due to national recommendations for restrictions regarding the COVID-19 Pandemic (e.g. social distancing) in an effort to limit this patient's exposure and mitigate transmission in our community.  Due to her co-morbid illnesses, this patient is at least at moderate risk for complications without adequate follow up.  This format is felt to be most appropriate for this patient at this time.  The patient did not have access to video technology/had technical difficulties with video requiring transitioning to audio format only (telephone).  All issues noted in this document were discussed and addressed.  No physical exam could be performed with this format.  Please refer to the patient's chart for her  consent to telehealth for [facility].   Evaluation Performed:  Follow-up visit  This visit type was conducted due to national recommendations for restrictions regarding the COVID-19 Pandemic (e.g. social distancing).  This format is felt to be most appropriate for this patient at this time.  All issues noted in this document were discussed and addressed.  No physical exam was performed (except for noted visual exam findings with Video Visits).  Please refer to the patient's chart (MyChart message for video visits and phone note for telephone visits) for the patient's consent to telehealth for [facility].  Date:  05/13/2020   ID:  Kara [name], MRN [id number]  Patient Location:  Home  Provider location:   [place]  PCP:  Aretta Nip, MD  Sleep Medicine:  Fransico Him, MD Electrophysiologist:  None   Chief Complaint:  OSA  History of Present Illness:    Kara [name] is a 54 y.o. female who presents via audio/video conferencing for a telehealth visit today.    Kara Williamsis a 54 y.o.femalewith a hx of mild OSA with AHI of 9.3/hr and was placed on CPAP at 6cm H2O. The  last time I saw her she had not been using her device because she felt that she was not able to get is cleaned correctly and it also was old.  We ordered a new device but insurance required a repeat sleep study first.  She underwent home sleep study which showed mild OSA with an AHI of 13.8/hr and O2 sats as low as 76%.  She underwent CPAP titration to 8cm H2O and is now here for followup.    She is doing well with her CPAP device and thinks that she has gotten used to it.  She tolerates the mask and feels the pressure is adequate.  Since going on CPAP she feels rested in the am and has no significant daytime sleepiness.  She denies any significant mouth or nasal dryness or nasal congestion.  She does not think that he snores.    The patient does not have symptoms concerning for COVID-19 infection (fever, chills, cough, or new shortness of breath).    Prior CV studies:   The following studies were reviewed today:  Home sleep study and CPAP titration, PAP download  Past Medical History:  Diagnosis Date  . Abnormal uterine bleeding (AUB)   . 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 Carpal tunnel syndrome   . Depression   . Family history of breast cancer   . HA (headache)   . Hypertension   . Migraines   . OSA (obstructive sleep apnea)    Mild OSA AHI 9.3/hr now on CPAP at 6CM H2O  . Personal  history of radiation therapy   . Sleep apnea    not used CPAP in over a month   Past Surgical History:  Procedure Laterality Date  . ABLATION    . BREAST LUMPECTOMY Left 07/08/2019  . BREAST LUMPECTOMY WITH RADIOACTIVE SEED LOCALIZATION Left 07/08/2019   Procedure: LEFT BREAST LUMPECTOMY X 3  WITH RADIOACTIVE SEED LOCALIZATION;  Surgeon: Erroll Luna, MD;  Location: [place];  Service: General;  Laterality: Left;  . BREAST RECONSTRUCTION Left 07/15/2019   Procedure: LEFT BREAST ONCOPLASTIC RECONSTRUCTION;  Surgeon: Irene Limbo, MD;  Location: [place];   Service: Plastics;  Laterality: Left;  . CESAREAN SECTION     x2  . DILATATION & CURETTAGE/HYSTEROSCOPY WITH MYOSURE N/A 06/04/2019   Procedure: DILATATION /HYSTEROSCOPY;  Surgeon: Janyth Pupa, DO;  Location: [place];  Service: Gynecology;  Laterality: N/A;  . LAPAROSCOPY N/A 06/04/2019   Procedure: LAPAROSCOPY Diagnostic, Repair Uterine Perforation;  Surgeon: Janyth Pupa, DO;  Location: [place];  Service: Gynecology;  Laterality: N/A;  . MASTOPEXY Right 07/15/2019   Procedure: RIGHT BREAST MASTOPEXY;  Surgeon: Irene Limbo, MD;  Location: [place];  Service: Plastics;  Laterality: Right;  . tummy tuck       Current Meds  Medication Sig  . Cholecalciferol (VITAMIN D3) 125 MCG (5000 UT) CAPS Take 5,000 capsules by mouth.  . citalopram (CELEXA) 40 MG tablet Take by mouth daily.  . clindamycin (CLEOCIN T) 1 % lotion APPLY A SMALL AMOUNT TO SKIN TWICE A DAY AS NEEDED  . FERREX 150 150 MG capsule Take by mouth daily.  Marland Kitchen FLUoxetine (PROZAC) 20 MG capsule Take 1 capsule (20 mg total) by mouth daily for 8 days, THEN 2 capsules (40 mg total) daily.  . hydrochlorothiazide (HYDRODIURIL) 25 MG tablet Take 25 mg by mouth daily.  . hydroquinone 4 % cream APPLY A SMALL AMOUNT TOPICALLY EVERY DAY TO THE SKIN  . ibuprofen (ADVIL) 800 MG tablet Take 800 mg by mouth as needed.  . Multiple Vitamins-Minerals (HAIR SKIN AND NAILS FORMULA PO) Take as directed  . nabumetone (RELAFEN) 500 MG tablet Take 500 mg by mouth 2 (two) times daily as needed.  . naproxen (NAPROSYN) 500 MG tablet TAKE 1 TABLET BY MOUTH EVERY 12 HOURS AS NEEDED FOR PAIN 30 DAYS  . potassium chloride (KLOR-CON) 10 MEQ tablet Take 1 tablet (10 mEq total) by mouth daily.  . tamoxifen (NOLVADEX) 20 MG tablet Take 20 mg by mouth daily.  . vitamin B-12 (CYANOCOBALAMIN) 500 MCG tablet Take 500 mcg by mouth daily.     Allergies:   Imitrex [sumatriptan], Hydrocil [psyllium], and Other    Social History   Tobacco Use  . Smoking status: Never Smoker  . Smokeless tobacco: Never Used  Vaping Use  . Vaping Use: Never used  Substance Use Topics  . Alcohol use: Yes    Alcohol/week: 0.0 standard drinks    Comment: social  . Drug use: No     Family Hx: The patient's family history includes Breast cancer in her maternal aunt and other family members; Breast cancer (age of onset: 70) in her sister; Hypertension in her mother; Lung cancer in her maternal uncle; Other (age of onset: 56) in her sister.  ROS:   Please see the history of present illness.     All other systems reviewed and are negative.   Labs/Other Tests and Data Reviewed:    Recent Labs: 12/23/2019: ALT 9; BUN 12;  Creatinine 0.91; Potassium 3.9; Sodium 144 01/21/2020: Hemoglobin 11.3; Platelet Count 279   Recent Lipid Panel No results found for: CHOL, TRIG, HDL, CHOLHDL, LDLCALC, LDLDIRECT  Wt Readings from Last 3 Encounters:  05/13/20 182 lb (82.6 kg)  02/25/20 185 lb 9.6 oz (84.2 kg)  01/16/20 183 lb (83 kg)     Objective:    Vital Signs:  BP (!) 136/68   Ht 5' 4.5" (1.638 m)   Wt 182 lb (82.6 kg)   BMI 30.76 kg/m     ASSESSMENT & PLAN:    1.  OSA -The patient is tolerating PAP therapy well without any problems. The PAP download was reviewed today and showed an AHI of 0.7/hr on 8 cm H2O with 90% compliance in using more than 4 hours nightly.  The patient has been using and benefiting from PAP use and will continue to benefit from therapy.   2.  HTN -BP controlled  -continue HCTZ 25mg  daily  3.  Obesity -I have encouraged her to get into a routine exercise program and cut back on carbs and portions.   COVID-19 Education: The signs and symptoms of COVID-19 were discussed with the patient and how to seek care for testing (follow up with PCP or arrange E-visit).  The importance of social distancing was discussed today.  Patient Risk:   After full review of this patient's clinical status,  I feel that they are at least moderate risk at this time.  Time:   Today, I have spent 20 minutes on telemedicine discussing medical problems including OSA, HTN, Obesity and reviewing patient's chart including home sleep study, PAP titration and PAP compliance download.  Medication Adjustments/Labs and Tests Ordered: Current medicines are reviewed at length with the patient today.  Concerns regarding medicines are outlined above.  Tests Ordered: No orders of the defined types were placed in this encounter.  Medication Changes: No orders of the defined types were placed in this encounter.   Disposition:  Follow up in 1 year(s)  Signed, Fransico Him, MD  05/13/2020 10:09 AM    [facility]

## 2020-05-17 ENCOUNTER — Telehealth: Payer: Self-pay | Admitting: Oncology

## 2020-05-17 NOTE — Telephone Encounter (Signed)
Rescheduled appts per provider PAL and print out with hand written instructions. Left voicemail with cancellation and new appt details.  

## 2020-05-18 ENCOUNTER — Other Ambulatory Visit: Payer: Self-pay

## 2020-05-18 ENCOUNTER — Telehealth (INDEPENDENT_AMBULATORY_CARE_PROVIDER_SITE_OTHER): Payer: No Typology Code available for payment source | Admitting: Psychiatry

## 2020-05-18 DIAGNOSIS — F411 Generalized anxiety disorder: Secondary | ICD-10-CM | POA: Diagnosis not present

## 2020-05-18 DIAGNOSIS — F329 Major depressive disorder, single episode, unspecified: Secondary | ICD-10-CM

## 2020-05-18 MED ORDER — FLUOXETINE HCL 40 MG PO CAPS: 40.0000 mg | ORAL_CAPSULE | Freq: Every day | ORAL | 0 refills | Status: DC

## 2020-05-18 NOTE — Progress Notes (Signed)
BH MD/PA/NP OP Progress Note  05/18/2020 11:47 AM Kara [name]  MRN:  [id number] Interview was conducted using videoconferencing application and I verified that I was speaking with the correct person using two identifiers. I discussed the limitations of evaluation and management by telemedicine and  the availability of in person appointments. Patient expressed understanding and agreed to proceed. Patient location - home; physician - home office.  Chief Complaint: Anxiety, depression.  HPI: 54 yo MAAF with no past psychiatric history who has been referred for medication management of depression/anxiety after being seen by [name] LCSW. She reports approximately a year loing problems with feeling depressed, irritable, excessively worried, tires, having difficulty falling asleep, problems with concentration and remembering things. She feels woryless, unsure of herself. She has no suicidal or homicidal thoughts, no hallucinations, delusions. She has no prior history of depression, no hx of mania. She does nt abuse alcohol or drugs. She worries about her health ever since she was diagnosed with breast cancer. She had a surgery in September 2020, went through radiation therapy and is currently on Tamoxifen. She was stated on citalopram by her PCP 6 months ago - currently on 40 mg. She was also placed on disability given her ongoing fatigue, cognitive and emotional problems. She works as a Radiation protection practitioner for [employer]. Long term disability was denied but it is under appeal review. [place] did not see any clear improvement in her depressive symptoms with citalopram. She noted that since she has been on that medication she feels more restless (particularly noticeable in the evening when she goes to bed) and started to gain some weight. We have cross tapered it to fluoxetine which she tolerates much better. More anxious (execssive worrying, ruminating) than depressed at this time. She is still  on 20 mg of fluoxetine. She had a counseling session with [name] last week and plans to continue. She is now using CPAP for OSA - sleep has improved from 4 to 6 hours per night. She dos not feel tired during the day.  Visit Diagnosis:    ICD-10-CM   1. GAD (generalized anxiety disorder)  F41.1   2. Major depressive disorder, single episode with anxious distress  F32.9     Past Psychiatric History: Please see intake H&P.  Past Medical History:  Past Medical History:  Diagnosis Date  . Abnormal uterine bleeding (AUB)   . 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 Carpal tunnel syndrome   . Depression   . Family history of breast cancer   . HA (headache)   . Hypertension   . Migraines   . OSA (obstructive sleep apnea)    Mild OSA AHI 9.3/hr now on CPAP at 6CM H2O  . Personal history of radiation therapy   . Sleep apnea    not used CPAP in over a month    Past Surgical History:  Procedure Laterality Date  . ABLATION    . BREAST LUMPECTOMY Left 07/08/2019  . BREAST LUMPECTOMY WITH RADIOACTIVE SEED LOCALIZATION Left 07/08/2019   Procedure: LEFT BREAST LUMPECTOMY X 3  WITH RADIOACTIVE SEED LOCALIZATION;  Surgeon: Erroll Luna, MD;  Location: [place];  Service: General;  Laterality: Left;  . BREAST RECONSTRUCTION Left 07/15/2019   Procedure: LEFT BREAST ONCOPLASTIC RECONSTRUCTION;  Surgeon: Irene Limbo, MD;  Location: [place];  Service: Plastics;  Laterality: Left;  . CESAREAN SECTION     x2  . DILATATION & CURETTAGE/HYSTEROSCOPY WITH MYOSURE N/A  06/04/2019   Procedure: DILATATION [place];  Surgeon: Janyth Pupa, DO;  Location: [place];  Service: Gynecology;  Laterality: N/A;  . LAPAROSCOPY N/A 06/04/2019   Procedure: LAPAROSCOPY Diagnostic, Repair Uterine Perforation;  Surgeon: Janyth Pupa, DO;  Location: [place];  Service: Gynecology;  Laterality: N/A;  . MASTOPEXY Right 07/15/2019    Procedure: RIGHT BREAST MASTOPEXY;  Surgeon: Irene Limbo, MD;  Location: [place];  Service: Plastics;  Laterality: Right;  . tummy tuck      Family Psychiatric History: None.  Family History:  Family History  Problem Relation Age of Onset  . Hypertension Mother   . Breast cancer Sister 22  . Breast cancer Maternal Aunt        dx 50s-60s  . Lung cancer Maternal Uncle   . Breast cancer Other        MGM's sister  . Other Sister 3       MVA  . Breast cancer Other        Mother's maternal cousins - x3    Social History:  Social History   Socioeconomic History  . Marital status: Married    Spouse name: [name]  . Number of children: 2  . Years of education: college  . Highest education level: Not on file  Occupational History    Comment: [employer]  Tobacco Use  . Smoking status: Never Smoker  . Smokeless tobacco: Never Used  Vaping Use  . Vaping Use: Never used  Substance and Sexual Activity  . Alcohol use: Yes    Alcohol/week: 0.0 standard drinks    Comment: social  . Drug use: No  . Sexual activity: Yes    Birth control/protection: Surgical    Comment: ablation  Other Topics Concern  . Not on file  Social History Narrative   Patient lives at home with her husband and two children.   Patient works full time for [employer].stopped in 2015, then at [employer].   Education college.   Right handed.   Caffeine two cup daily coffee.   Social Determinants of Health   Financial Resource Strain:   . Difficulty of Paying Living Expenses:   Food Insecurity:   . Worried About Charity fundraiser in the Last Year:   . Arboriculturist in the Last Year:   Transportation Needs:   . Film/video editor (Medical):   Marland Kitchen Lack of Transportation (Non-Medical):   Physical Activity:   . Days of Exercise per Week:   . Minutes of Exercise per Session:   Stress:   . Feeling of Stress :   Social Connections:   . Frequency of Communication with Friends and  Family:   . Frequency of Social Gatherings with Friends and Family:   . Attends Religious Services:   . Active Member of Clubs or Organizations:   . Attends Archivist Meetings:   Marland Kitchen Marital Status:     Allergies:  Allergies  Allergen Reactions  . Imitrex [Sumatriptan] Other (See Comments)    Nauseated - Feel like passing out - lack of therapeutic effect.  . Hydrocil [Psyllium] Nausea And Vomiting  . Other     "some kind of pain medicine a long time ago made me feel nauseous"     Metabolic Disorder Labs: No results found for: HGBA1C, MPG No results found for: PROLACTIN No results found for: CHOL, TRIG, HDL, CHOLHDL, VLDL, LDLCALC No results found for: TSH  Therapeutic Level Labs:  No results found for: LITHIUM No results found for: VALPROATE No components found for:  CBMZ  Current Medications: Current Outpatient Medications  Medication Sig Dispense Refill  . Cholecalciferol (VITAMIN D3) 125 MCG (5000 UT) CAPS Take 5,000 capsules by mouth.    . clindamycin (CLEOCIN T) 1 % lotion APPLY A SMALL AMOUNT TO SKIN TWICE A DAY AS NEEDED    . FERREX 150 150 MG capsule Take by mouth daily.    Marland Kitchen FLUoxetine (PROZAC) 40 MG capsule Take 1 capsule (40 mg total) by mouth daily. 90 capsule 0  . hydrochlorothiazide (HYDRODIURIL) 25 MG tablet Take 25 mg by mouth daily.    . hydroquinone 4 % cream APPLY A SMALL AMOUNT TOPICALLY EVERY DAY TO THE SKIN    . ibuprofen (ADVIL) 800 MG tablet Take 800 mg by mouth as needed.    . Multiple Vitamins-Minerals (HAIR SKIN AND NAILS FORMULA PO) Take as directed    . nabumetone (RELAFEN) 500 MG tablet Take 500 mg by mouth 2 (two) times daily as needed.    . naproxen (NAPROSYN) 500 MG tablet TAKE 1 TABLET BY MOUTH EVERY 12 HOURS AS NEEDED FOR PAIN 30 DAYS    . potassium chloride (KLOR-CON) 10 MEQ tablet Take 1 tablet (10 mEq total) by mouth daily. 90 tablet 4  . tamoxifen (NOLVADEX) 20 MG tablet Take 20 mg by mouth daily.    . vitamin B-12  (CYANOCOBALAMIN) 500 MCG tablet Take 500 mcg by mouth daily.     No current facility-administered medications for this visit.   Psychiatric Specialty Exam: Review of Systems  Psychiatric/Behavioral: The patient is nervous/anxious.   All other systems reviewed and are negative.   There were no vitals taken for this visit.There is no height or weight on file to calculate BMI.  General Appearance: Casual and Well Groomed  Eye Contact:  Good  Speech:  Clear and Coherent and Normal Rate  Volume:  Normal  Mood:  Anxious and Depressed  Affect:  Constricted  Thought Process:  Goal Directed  Orientation:  Full (Time, Place, and Person)  Thought Content: Rumination   Suicidal Thoughts:  No  Homicidal Thoughts:  No  Memory:  Immediate;   Fair Recent;   Fair Remote;   Good  Judgement:  Good  Insight:  Good  Psychomotor Activity:  Normal  Concentration:  Concentration: Fair  Recall:  [place] of Knowledge: Good  Language: Good  Akathisia:  Negative  Handed:  Right  AIMS (if indicated): not done  Assets:  Communication Skills Desire for Improvement Financial Resources/Insurance Housing Social Support Talents/Skills  ADL's:  Intact  Cognition: WNL  Sleep:  Fair   Screenings: GAD-7     Counselor from 05/11/2020 in [place]  Total GAD-7 Score 13    PHQ2-9     Counselor from 05/11/2020 in [place]  PHQ-2 Total Score 2  PHQ-9 Total Score 12       Assessment and Plan: 54 yo MAAF with no past psychiatric history who has been referred for medication management of depression/anxiety after being seen by [name] LCSW. She reports approximately a year loing problems with feeling depressed, irritable, excessively worried, tires, having difficulty falling asleep, problems with concentration and remembering things. She feels woryless, unsure of herself. She has no suicidal or homicidal thoughts, no  hallucinations, delusions. She has no prior history of depression, no hx of mania. She does nt abuse alcohol or drugs. She worries about  her health ever since she was diagnosed with breast cancer in July last year. She had a surgery in September 2020, went through radiation therapy and is currently on Tamoxifen. She was stated on citalopram by her PCP 6 months ago - currently on 40 mg. She was also placed on disability given her ongoing fatigue, cognitive and emotional problems. She works as a Radiation protection practitioner for [employer]. Long term disability was denied but it is under appeal review. [place] did not see any clear improvement in her depressive symptoms with citalopram. She noted that since she has been on that medication she feels more restless (particularly noticeable in the evening when she goes to bed) and started to gain some weight. We have cross tapered it to fluoxetine which she tolerates much better. More anxious (execssive worrying, ruminating) than depressed at this time. She is still on 20 mg of fluoxetine. She had a counseling session with [name] last week and plans to continue. She is now using CPAP for OSA - sleep has improved from 4 to 6 hours per night. She dos not feel tired during the day.  Dx: Major depressive disorder single episode moderate; GAD  Plan: We will increase fluoxetine to 40 mg daily. She will continue counseling with [name]. We may need to fill out another set of forms for disability (long term) - at this point she is too anxious, depressed, and distractible to be ble to adequately perform her job duties. When her emotional condition will stabilize is unclear at this point but we tentatively will ask for long term disability to be continued till the end of the year. Next appointment with me in 4 weeks. The plan was discussed with patient who had an opportunity to ask questions and these were all answered. I spend 25 minutes in videoconferencing with the  patient.    Stephanie Acre, MD 05/18/2020, 11:47 AM

## 2020-05-25 ENCOUNTER — Ambulatory Visit (INDEPENDENT_AMBULATORY_CARE_PROVIDER_SITE_OTHER): Payer: No Typology Code available for payment source | Admitting: Licensed Clinical Social Worker

## 2020-05-25 ENCOUNTER — Other Ambulatory Visit: Payer: Self-pay

## 2020-05-25 DIAGNOSIS — F321 Major depressive disorder, single episode, moderate: Secondary | ICD-10-CM | POA: Diagnosis not present

## 2020-05-25 DIAGNOSIS — F411 Generalized anxiety disorder: Secondary | ICD-10-CM

## 2020-05-25 NOTE — Progress Notes (Signed)
Virtual Visit via Video Note   I connected with Kara [name] on 05/25/20 at 9:00am by video enabled telemedicine application and verified that I am speaking with the correct person using two identifiers.   I discussed the limitations, risks, security and privacy concerns of performing an evaluation and management service by telephone and the availability of in person appointments. I also discussed with the patient that there may be a patient responsible charge related to this service. The patient expressed understanding and agreed to proceed.   I discussed the assessment and treatment plan with the patient. The patient was provided an opportunity to ask questions and all were answered. The patient agreed with the plan and demonstrated an understanding of the instructions.   The patient was advised to call back or seek an in-person evaluation if the symptoms worsen or if the condition fails to improve as anticipated.   I provided 30 minutes of non-face-to-face time during this encounter.     [name], LCSW, LCAS ____________________ THERAPIST PROGRESS NOTE   Session Time: 9:00am - 9:30am   Location: Patient: Patient Home Provider: [facility] [place] [facility]    Participation Level: Active   Behavioral Response: Alert, casually dressed, neatly groomed, depressed mood/affect   Type of Therapy:  Individual Therapy   Treatment Goals addressed: Anxiety/depression management; Medication management   Interventions: CBT, 5-4-3-2-1 grounding   Summary: Kara [name] is a 54 year old married African American female that presented for virtual therapy today with diagnoses of Major Depressive Disorder, Single Episode, Moderate, and Generalized Anxiety Disorder.     Suicidal/Homicidal: None; without plan or intent.     Therapist Response: Clinician met with Kara [name] for virtual therapy session and assessed for safety, sobriety, and medication compliance.  Kara [name] presented for session on time and was alert,  oriented x5, with no evidence or self-report of active SI/HI or A/V H.  Kara [name] reported compliance with medications and denied any abuse of alcohol or illicit substances.  Clinician inquired about Kara [name]'s emotional ratings today, as well as any significant changes in thoughts, feelings, or behavior since last check-in.  Kara [name] reported scores of 10/10 for both depression and anxiety, stating "I've been sick this past week and my father passed away last 2022-11-30".  Clinician empathized with Kara [name] regarding this loss and inquired about what symptoms she is presently dealing with.  Kara [name] reported that she has been congested, with a sore throat, and cough, but disclosed that she has been vaccinated against COVID, and tested negative for it following onset of symptoms.  Clinician encouraged Kara [name] to monitor her symptoms closely and seek medical attention if condition worsens.  Kara [name] was agreeable to this and reported that she is trying to get rest, stay hydrated, and chose not to attend father's service today in person due to condition.  Kara [name] reported that her mind has been preoccupied lately with recent stressors and she experienced panic attack like symptoms for first time in life last Sunday when she received a letter in the mail about work disability.  Clinician offered to teach Kara [name] the 5-4-3-2-1 grounding technique today which she could use to temporarily distract herself from troubling thoughts and feelings when feeling overwhelmed.  Kara [name] was receptive to this, so clinician guided her through process of using her senses to ground herself by identifying 5 things in her environment she could see, 4 she could touch, 3 she could hear, 2 she could smell, and 1 she could taste.  Intervention was effective, as evidenced by  Kara [name] participating in activity successfully and reporting that it did appear helpful for distraction, so she would practice it in upcoming days when she feels overwhelming anxiety.  Kara [name] ended session early due  to physical condition and agreed to follow up in 2 weeks. Clinician will continue to monitor.   Plan: Follow up again in 2 weeks.       Diagnosis: Major Depressive Disorder, Single Episode, Moderate, and Generalized Anxiety Disorder   [name], LCSW, LCAS 05/25/20

## 2020-06-01 ENCOUNTER — Other Ambulatory Visit: Payer: 59

## 2020-06-01 ENCOUNTER — Ambulatory Visit: Payer: 59 | Admitting: Oncology

## 2020-06-02 ENCOUNTER — Encounter: Payer: Self-pay | Admitting: Adult Health

## 2020-06-02 ENCOUNTER — Other Ambulatory Visit: Payer: Self-pay

## 2020-06-02 ENCOUNTER — Inpatient Hospital Stay: Payer: No Typology Code available for payment source | Attending: Oncology | Admitting: Adult Health

## 2020-06-02 VITALS — BP 136/67 | HR 62 | Temp 97.4°F | Resp 18 | Ht 64.5 in | Wt 178.3 lb

## 2020-06-02 DIAGNOSIS — Z923 Personal history of irradiation: Secondary | ICD-10-CM | POA: Diagnosis not present

## 2020-06-02 DIAGNOSIS — G4733 Obstructive sleep apnea (adult) (pediatric): Secondary | ICD-10-CM | POA: Insufficient documentation

## 2020-06-02 DIAGNOSIS — Z8249 Family history of ischemic heart disease and other diseases of the circulatory system: Secondary | ICD-10-CM | POA: Diagnosis not present

## 2020-06-02 DIAGNOSIS — I10 Essential (primary) hypertension: Secondary | ICD-10-CM | POA: Insufficient documentation

## 2020-06-02 DIAGNOSIS — Z170 Estrogen receptor positive status [ER+]: Secondary | ICD-10-CM | POA: Insufficient documentation

## 2020-06-02 DIAGNOSIS — Z803 Family history of malignant neoplasm of breast: Secondary | ICD-10-CM | POA: Insufficient documentation

## 2020-06-02 DIAGNOSIS — Z801 Family history of malignant neoplasm of trachea, bronchus and lung: Secondary | ICD-10-CM | POA: Insufficient documentation

## 2020-06-02 DIAGNOSIS — F329 Major depressive disorder, single episode, unspecified: Secondary | ICD-10-CM | POA: Insufficient documentation

## 2020-06-02 DIAGNOSIS — Z79899 Other long term (current) drug therapy: Secondary | ICD-10-CM | POA: Insufficient documentation

## 2020-06-02 DIAGNOSIS — Z886 Allergy status to analgesic agent status: Secondary | ICD-10-CM | POA: Insufficient documentation

## 2020-06-02 DIAGNOSIS — Z79810 Long term (current) use of selective estrogen receptor modulators (SERMs): Secondary | ICD-10-CM | POA: Insufficient documentation

## 2020-06-02 DIAGNOSIS — C50312 Malignant neoplasm of lower-inner quadrant of left female breast: Secondary | ICD-10-CM | POA: Insufficient documentation

## 2020-06-02 NOTE — Progress Notes (Signed)
[facility]  [phone] [phone]     ID: Kara [name] DOB: 1965/12/21  MR#: [id number]  QHQ#:[id number]  Patient Care Team: Clayborn Heron, MD as PCP - General (Family Medicine) Quintella Reichert, MD as Consulting Physician (Cardiology) Glenna Fellows, MD as Consulting Physician (Plastic Surgery) Harriette Bouillon, MD as Consulting Physician (General Surgery) Magrinat, Valentino Hue, MD as Consulting Physician (Oncology) Dorothy Puffer, MD as Consulting Physician (Radiation Oncology) Myna Hidalgo, DO as Consulting Physician (Obstetrics and Gynecology) Noreene Filbert, NP OTHER MD:  CHIEF COMPLAINT: Estrogen receptor positive ductal carcinoma in situ  CURRENT TREATMENT: adjuvant radiation    INTERVAL HISTORY: Kara [name] is here today for follow up of her estrogen positive breast cancer.   She is taking Tamoxifen daily.She notes she is tolerating this well.    Geralynn's most recent mammogram was completed on 04/20/2020 and demonstrated no evidence of malignancy and breast density category C.  Her most recent bone density testing was completed on 08/08/2019 and was normal.    REVIEW OF SYSTEMS: Vivadenies any new issues.  She is without any fever, chills, chest pain, or palpitations.  She is not having any bowel/bladder changes, nausea, vomiting, headaches, or vision issues.  She is exercising regularly, and is up to date with her PCP f/u.  A detailed ROS was otherwise non contributory.     HISTORY OF CURRENT ILLNESS: From the original intake note:  Kara [name] had routine screening mammography on 04/11/2019 showing a possible abnormality in the left breast. She underwent bilateral diagnostic mammography with tomography at [facility] on 04/17/2019 showing: breast density category C; indeterminate 4 cm calcifications in the lower-inner quadrant of the left breast.   Accordingly on 04/23/2019 she proceeded to biopsy of the left  breast area in question. The pathology from this procedure ([id number]) showed: ductal carcinoma in situ with calcifications, low grade; fibrocystic and fibroadenomatoid change.  Prognostic indicators significant for: estrogen receptor, 90% positive with strong staining intensity and progesterone receptor, 60% positive with moderate staining intensity.   The patient's subsequent history is as detailed below.   PAST MEDICAL HISTORY: Past Medical History:  Diagnosis Date  . Abnormal uterine bleeding (AUB)   . Cancer (HCC) 04/2019   left breast DCIS  . Carpal tunnel syndrome   . Depression   . Family history of breast cancer   . HA (headache)   . Hypertension   . Migraines   . OSA (obstructive sleep apnea)    Mild OSA AHI 9.3/hr now on CPAP at 6CM H2O  . Personal history of radiation therapy   . Sleep apnea    not used CPAP in over a month    PAST SURGICAL HISTORY: Past Surgical History:  Procedure Laterality Date  . ABLATION    . BREAST LUMPECTOMY Left 07/08/2019  . BREAST LUMPECTOMY WITH RADIOACTIVE SEED LOCALIZATION Left 07/08/2019   Procedure: LEFT BREAST LUMPECTOMY X 3  WITH RADIOACTIVE SEED LOCALIZATION;  Surgeon: Harriette Bouillon, MD;  Location: [facility];  Service: General;  Laterality: Left;  . BREAST RECONSTRUCTION Left 07/15/2019   Procedure: LEFT BREAST ONCOPLASTIC RECONSTRUCTION;  Surgeon: Glenna Fellows, MD;  Location: [facility];  Service: Plastics;  Laterality: Left;  . CESAREAN SECTION     x2  . DILATATION & CURETTAGE/HYSTEROSCOPY WITH MYOSURE N/A 06/04/2019   Procedure: DILATATION /HYSTEROSCOPY;  Surgeon: Myna Hidalgo, DO;  Location: [facility];  Service: Gynecology;  Laterality: N/A;  . LAPAROSCOPY N/A  06/04/2019   Procedure: LAPAROSCOPY Diagnostic, Repair Uterine Perforation;  Surgeon: Janyth Pupa, DO;  Location: [place];  Service: Gynecology;  Laterality: N/A;  . MASTOPEXY Right 07/15/2019    Procedure: RIGHT BREAST MASTOPEXY;  Surgeon: Irene Limbo, MD;  Location: [place];  Service: Plastics;  Laterality: Right;  . tummy tuck    right ankle surgery as a kid   FAMILY HISTORY: Family History  Problem Relation Age of Onset  . Hypertension Mother   . Breast cancer Sister 69  . Breast cancer Maternal Aunt        dx 50s-60s  . Lung cancer Maternal Uncle   . Breast cancer Other        MGM's sister  . Other Sister 78       MVA  . Breast cancer Other        Mother's maternal cousins - x3   Patient's father is living at age 11-80. Patient's mother is also living at age 68. (as of 05/2019) The patient's maternal half-sister was diagnosed with breast cancer at age 17. Another half-sister had breast cancer before the age of 35 and has since passed away.  The patient has 2 half siblings on her mother's side and 3 half siblings on her father's. The patient denies a family hx of ovarian, prostate or pancreatic cancer.   GYNECOLOGIC HISTORY:  No LMP recorded. Patient has had an ablation. Menarche: 54 years old Age at first live birth: 54 years old [place] P 2 LMP she is currently spotting Contraceptive: used for ~25 years with no problems HRT n/a  Hysterectomy? no BSO? 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 She also has a degree in chemistry. She is married but separated. Husband [name] is a Barrister's clerk. They have not completed any separation paperwork at this time and she describes him as friendly and supportive. She lives at home with her youngest son Kara [name], age 53, a Ship broker.  Older son Kara [name], age 53, lives in [place] as a Ship broker of [employer] in liberal arts.  The patient attends [employer].    ADVANCED DIRECTIVES: Kynadi is comfortable with her husband [name] as her HCPOA   HEALTH MAINTENANCE: Social History   Tobacco Use  .  Smoking status: Never Smoker  . Smokeless tobacco: Never Used  Vaping Use  . Vaping Use: Never used  Substance Use Topics  . Alcohol use: Yes    Alcohol/week: 0.0 standard drinks    Comment: social  . Drug use: No     Colonoscopy:   PAP: Due  Bone density: never done   Allergies  Allergen Reactions  . Imitrex [Sumatriptan] Other (See Comments)    Nauseated - Feel like passing out - lack of therapeutic effect.  . Hydrocil [Psyllium] Nausea And Vomiting  . Other     "some kind of pain medicine a long time ago made me feel nauseous"     Current Outpatient Medications  Medication Sig Dispense Refill  . Cholecalciferol (VITAMIN D3) 125 MCG (5000 UT) CAPS Take 5,000 capsules by mouth.    . clindamycin (CLEOCIN T) 1 % lotion APPLY A SMALL AMOUNT TO SKIN TWICE A DAY AS NEEDED    . FERREX 150 150 MG capsule Take by mouth daily.    Marland Kitchen FLUoxetine (PROZAC) 40 MG capsule Take 1 capsule (40 mg total) by  mouth daily. 90 capsule 0  . hydrochlorothiazide (HYDRODIURIL) 25 MG tablet Take 25 mg by mouth daily.    . hydroquinone 4 % cream APPLY A SMALL AMOUNT TOPICALLY EVERY DAY TO THE SKIN    . ibuprofen (ADVIL) 800 MG tablet Take 800 mg by mouth as needed.    . Multiple Vitamins-Minerals (HAIR SKIN AND NAILS FORMULA PO) Take as directed    . nabumetone (RELAFEN) 500 MG tablet Take 500 mg by mouth 2 (two) times daily as needed.    . naproxen (NAPROSYN) 500 MG tablet TAKE 1 TABLET BY MOUTH EVERY 12 HOURS AS NEEDED FOR PAIN 30 DAYS    . potassium chloride (KLOR-CON) 10 MEQ tablet Take 1 tablet (10 mEq total) by mouth daily. 90 tablet 4  . tamoxifen (NOLVADEX) 20 MG tablet Take 20 mg by mouth daily.    . vitamin B-12 (CYANOCOBALAMIN) 500 MCG tablet Take 500 mcg by mouth daily.     No current facility-administered medications for this visit.    OBJECTIVE:   Vitals:   06/02/20 1611  BP: 136/67  Pulse: 62  Resp: 18  Temp: (!) 97.4 F (36.3 C)  SpO2: 100%     Body mass index is 30.13 kg/m.    Wt Readings from Last 3 Encounters:  06/02/20 178 lb 4.8 oz (80.9 kg)  05/13/20 182 lb (82.6 kg)  02/25/20 185 lb 9.6 oz (84.2 kg)      ECOG FS:1 - Symptomatic but completely ambulatory GENERAL: Patient is a well appearing female in no acute distress HEENT:  Sclerae anicteric.  Oropharynx clear and moist. No ulcerations or evidence of oropharyngeal candidiasis. Neck is supple.  NODES:  No cervical, supraclavicular, or axillary lymphadenopathy palpated.  BREAST EXAM:  Right breast s/p reduction, left breast s/p lumpectomy and radiation therapy, no sign of local recurrence.  LUNGS:  Clear to auscultation bilaterally.  No wheezes or rhonchi. HEART:  Regular rate and rhythm. No murmur appreciated. ABDOMEN:  Soft, nontender.  Positive, normoactive bowel sounds. No organomegaly palpated. MSK:  No focal spinal tenderness to palpation. Full range of motion bilaterally in the upper extremities. EXTREMITIES:  No peripheral edema.   SKIN:  Clear with no obvious rashes or skin changes. No nail dyscrasia. NEURO:  Nonfocal. Well oriented.  Appropriate affect.    LAB RESULTS:  CMP     Component Value Date/Time   NA 144 12/23/2019 1153   NA 138 11/02/2014 0932   K 3.9 12/23/2019 1153   CL 105 12/23/2019 1153   CO2 32 12/23/2019 1153   GLUCOSE 99 12/23/2019 1153   BUN 12 12/23/2019 1153   BUN 16 11/02/2014 0932   CREATININE 0.91 12/23/2019 1153   CALCIUM 9.0 12/23/2019 1153   PROT 7.5 12/23/2019 1153   ALBUMIN 3.7 12/23/2019 1153   AST 14 (L) 12/23/2019 1153   ALT 9 12/23/2019 1153   ALKPHOS 47 12/23/2019 1153   BILITOT 0.3 12/23/2019 1153   GFRNONAA >60 12/23/2019 1153   GFRAA >60 12/23/2019 1153    No results found for: TOTALPROTELP, ALBUMINELP, A1GS, A2GS, BETS, BETA2SER, GAMS, MSPIKE, SPEI  No results found for: KPAFRELGTCHN, LAMBDASER, KAPLAMBRATIO  Lab Results  Component Value Date   WBC 3.0 (L) 01/21/2020   NEUTROABS 1.8 01/21/2020   HGB 11.3 (L) 01/21/2020   HCT 34.4  (L) 01/21/2020   MCV 96.1 01/21/2020   PLT 279 01/21/2020    No results found for: LABCA2  No components found for: IWPYKD983  No results for input(s):  INR in the last 168 hours.  No results found for: LABCA2  No results found for: TGG269  No results found for: SWN462  No results found for: VOJ500  No results found for: CA2729  No components found for: HGQUANT  No results found for: CEA1 / No results found for: CEA1   No results found for: AFPTUMOR  No results found for: CHROMOGRNA  No results found for: PSA1  No visits with results within 3 Day(s) from this visit.  Latest known visit with results is:  Appointment on 01/21/2020  Component Date Value Ref Range Status  . WBC Count 01/21/2020 3.0* 4.0 - 10.5 K/uL Final  . RBC 01/21/2020 3.58* 3.87 - 5.11 MIL/uL Final  . Hemoglobin 01/21/2020 11.3* 12.0 - 15.0 g/dL Final  . HCT 01/21/2020 34.4* 36 - 46 % Final  . MCV 01/21/2020 96.1  80.0 - 100.0 fL Final  . MCH 01/21/2020 31.6  26.0 - 34.0 pg Final  . MCHC 01/21/2020 32.8  30.0 - 36.0 g/dL Final  . RDW 01/21/2020 13.2  11.5 - 15.5 % Final  . Platelet Count 01/21/2020 279  150 - 400 K/uL Final  . nRBC 01/21/2020 0.0  0.0 - 0.2 % Final  . Neutrophils Relative % 01/21/2020 60  % Final  . Neutro Abs 01/21/2020 1.8  1.7 - 7.7 K/uL Final  . Lymphocytes Relative 01/21/2020 24  % Final  . Lymphs Abs 01/21/2020 0.7  0.7 - 4.0 K/uL Final  . Monocytes Relative 01/21/2020 14  % Final  . Monocytes Absolute 01/21/2020 0.4  0 - 1 K/uL Final  . Eosinophils Relative 01/21/2020 1  % Final  . Eosinophils Absolute 01/21/2020 0.0  0 - 0 K/uL Final  . Basophils Relative 01/21/2020 0  % Final  . Basophils Absolute 01/21/2020 0.0  0 - 0 K/uL Final  . Immature Granulocytes 01/21/2020 1  % Final  . Abs Immature Granulocytes 01/21/2020 0.02  0.00 - 0.07 K/uL Final   Performed at [facility], [place] [address] [place], [place] [address]    (this displays the last  labs from the last 3 days)  No results found for: TOTALPROTELP, ALBUMINELP, A1GS, A2GS, BETS, BETA2SER, GAMS, MSPIKE, SPEI (this displays SPEP labs)  No results found for: KPAFRELGTCHN, LAMBDASER, KAPLAMBRATIO (kappa/lambda light chains)  No results found for: HGBA, HGBA2QUANT, HGBFQUANT, HGBSQUAN (Hemoglobinopathy evaluation)   No results found for: LDH  No results found for: IRON, TIBC, IRONPCTSAT (Iron and TIBC)  No results found for: FERRITIN  Urinalysis No results found for: COLORURINE, APPEARANCEUR, LABSPEC, PHURINE, GLUCOSEU, HGBUR, BILIRUBINUR, KETONESUR, PROTEINUR, UROBILINOGEN, NITRITE, LEUKOCYTESUR   STUDIES: No results found.  ELIGIBLE FOR AVAILABLE RESEARCH PROTOCOL: opted against COMET  ASSESSMENT: 54 y.o.  [place] woman status post left breast biopsy 04/23/2023 ductal carcinoma in situ, low-grade, estrogen and progesterone receptor positive  (1) s/p left lumpectomy on 07/08/2019 showing 0.2cm DCIS, grade 1, margins negative  (a) s/p bilateral mammoplasty on 07/15/2019  (2) adjuvant radiation started 08/07/2019  (3) considering prophylactic antiestrogens  (a) on 08/07/2019 her LH was 35.6 and FSH 46.3, with estradiol 37.5  (4) genetics testing 05/30/2019 through the [facility] STAT Panel + Common Hereditary Cancers Panel found no deleterious mutations in ATM, BRCA1, BRCA2, CDH1, CHEK2, PALB2, PTEN, STK11 and TP53. or in APC, ATM, AXIN2, BARD1, BMPR1A, BRCA1, BRCA2, BRIP1, CDH1, CDKN2A (p14ARF), CDKN2A (p16INK4a), CKD4, CHEK2, CTNNA1, DICER1, EPCAM (Deletion/duplication testing only), GREM1 (promoter region deletion/duplication testing only), KIT, MEN1, MLH1, MSH2, MSH3, MSH6, MUTYH,  NBN, NF1, NHTL1, PALB2, PDGFRA, PMS2, POLD1, POLE, PTEN, RAD50, RAD51C, RAD51D, RNF43, SDHB, SDHC, SDHD, SMAD4, SMARCA4. STK11, TP53, TSC1, TSC2, and VHL.  The following genes were evaluated for sequence changes only: SDHA and HOXB13 c.251G>A variant only.   PLAN: Ashyra is here today for  follow up of her left breast dcis s/p lumpectomy, radiation therapy, and on tamoxifen daily.  She is tolerating Tamoxifen well and has no clinical or radiographic sign of breast cancer recurrence.  She will be due for mammogram again in 04/2021.  She was recommended to continue with healthy diet and exercise, and regular f/u with her PCP.    We will see her back in 6 months for f/u with Dr. Jana Hakim.  She knows to call for any questions that may arise between now and her next appointment.  We are happy to see her sooner if needed.   Total encounter time: 20 minutes*  Wilber Bihari, NP 06/02/20 4:35 PM Medical Oncology and Hematology [facility] [place], [place] [address] Tel. [phone]    Fax. [phone]  *Total Encounter Time as defined by the Centers for Medicare and Medicaid Services includes, in addition to the face-to-face time of a patient visit (documented in the note above) non-face-to-face time: obtaining and reviewing outside history, ordering and reviewing medications, tests or procedures, care coordination (communications with other health care professionals or caregivers) and documentation in the medical record.

## 2020-06-03 ENCOUNTER — Telehealth: Payer: Self-pay | Admitting: Adult Health

## 2020-06-03 NOTE — Telephone Encounter (Signed)
Scheduled appts per 8/18 los. Left voicemail with appt date and time.

## 2020-06-11 ENCOUNTER — Ambulatory Visit (INDEPENDENT_AMBULATORY_CARE_PROVIDER_SITE_OTHER): Payer: No Typology Code available for payment source | Admitting: Licensed Clinical Social Worker

## 2020-06-11 ENCOUNTER — Other Ambulatory Visit: Payer: Self-pay

## 2020-06-11 DIAGNOSIS — F411 Generalized anxiety disorder: Secondary | ICD-10-CM

## 2020-06-11 DIAGNOSIS — F321 Major depressive disorder, single episode, moderate: Secondary | ICD-10-CM

## 2020-06-11 NOTE — Progress Notes (Signed)
Virtual Visit via Video Note   I connected with Kara [name] on 06/11/20 at 9:00am by video enabled telemedicine application and verified that I am speaking with the correct person using two identifiers.   I discussed the limitations, risks, security and privacy concerns of performing an evaluation and management service by telephone and the availability of in person appointments. I also discussed with the patient that there may be a patient responsible charge related to this service. The patient expressed understanding and agreed to proceed.   I discussed the assessment and treatment plan with the patient. The patient was provided an opportunity to ask questions and all were answered. The patient agreed with the plan and demonstrated an understanding of the instructions.   The patient was advised to call back or seek an in-person evaluation if the symptoms worsen or if the condition fails to improve as anticipated.   I provided 45 minutes of non-face-to-face time during this encounter.     [name], LCSW, LCAS ____________________ THERAPIST PROGRESS NOTE   Session Time: 9:00am - 9:45am   Location: Patient: Patient Home Provider: [facility] [place] [facility]    Participation Level: Active   Behavioral Response: Alert, casually dressed, neatly groomed, depressed mood/affect   Type of Therapy:  Individual Therapy   Treatment Goals addressed: Anxiety/depression management; Medication management   Interventions: CBT, stages of grieving   Summary: Kara [name] is a 54 year old married African American female that presented for virtual therapy today with diagnoses of Major Depressive Disorder, Single Episode, Moderate, and Generalized Anxiety Disorder.     Suicidal/Homicidal: None; without plan or intent.     Therapist Response: Clinician met with Kara [name] for virtual therapy appointment and assessed for safety, sobriety, and medication compliance.  Kara [name] presented for appointment on time and was  alert, oriented x5, with no evidence or self-report of active SI/HI or A/V H.  Kara [name] reported that she is taking medication as prescribed and denied any abuse of alcohol or illicit substances.  Clinician inquired about Kara [name]'s current emotional ratings, as well as any significant changes in thoughts, feelings, or behavior since previous check-in.  Kara [name] reported scores of 8/10 for both depression and anxiety, stating "I'm not sick anymore, but I've been mostly staying inside, sleeping in and doing the minimum".  Kara [name] denied any reoccurrence of panic attacks and reported that she did practice 5-4-3-2-1 grounding a few times, stating "It did help take my mind off things".  Clinician inquired about how Kara [name] has been handling recent loss of her father, and whether she was able to attend service.  Kara [name] reported that she was able to attend the service and acknowledged working towards acceptance of this loss.  Clinician discussed the 5 stages of grieving with Kara [name] today, including denial, anger, bargaining, depression, and acceptance, as well as what one can expect to deal with in each stage, and steps they can take to ensure healthy transitions towards acceptance of loss.  Interventions were effective, as evidenced by Kara [name] reporting that this discussion helped her recognize that she is presently in the depression stage, and dealing with some feelings of guilt regarding lost time and missed opportunities with her father. Kara [name] acknowledged that she needs to reach out more to her supports to aid in healthy grieving, along with praying, and staying engaged in therapy.  Kara [name] stated "I can remember the good things, stay in contact with my family, and I'll make it through all this".  Clinician also referred Kara [name] to [facility] virtual grief and loss group as an additional outlet to process feelings in safe, supportive setting.  Clinician will continue to monitor.   Plan: Follow up again in 2 weeks.        Diagnosis: Major Depressive Disorder, Single Episode, Moderate, and Generalized Anxiety Disorder   [name], LCSW, LCAS 06/11/20

## 2020-06-18 ENCOUNTER — Other Ambulatory Visit: Payer: Self-pay

## 2020-06-18 ENCOUNTER — Telehealth (INDEPENDENT_AMBULATORY_CARE_PROVIDER_SITE_OTHER): Payer: No Typology Code available for payment source | Admitting: Psychiatry

## 2020-06-18 DIAGNOSIS — F329 Major depressive disorder, single episode, unspecified: Secondary | ICD-10-CM | POA: Diagnosis not present

## 2020-06-18 DIAGNOSIS — Z634 Disappearance and death of family member: Secondary | ICD-10-CM

## 2020-06-18 DIAGNOSIS — F411 Generalized anxiety disorder: Secondary | ICD-10-CM

## 2020-06-18 MED ORDER — ZOLPIDEM TARTRATE ER 6.25 MG PO TBCR: 6.2500 mg | EXTENDED_RELEASE_TABLET | Freq: Every evening | ORAL | 1 refills | Status: DC | PRN

## 2020-06-18 MED ORDER — ARIPIPRAZOLE 2 MG PO TABS: 2.0000 mg | ORAL_TABLET | Freq: Every day | ORAL | 1 refills | Status: DC

## 2020-06-18 NOTE — Progress Notes (Signed)
BH MD/PA/NP OP Progress Note  06/18/2020 11:26 AM Kara [name]  MRN:  [id number] Interview was conducted using videoconferencing application and I verified that I was speaking with the correct person using two identifiers. I discussed the limitations of evaluation and management by telemedicine and  the availability of in person appointments. Patient expressed understanding and agreed to proceed. Patient location - home; physician - home office.  Chief Complaint: Depression, anxiety, insomnia.  HPI: 54 yo MAAF with no past psychiatric history who has been referred for medication management of depression/anxiety after being seen by [name] LCSW. She reports approximately a year long problems with feeling depressed, irritable, excessively worried, tires, having difficulty falling asleep, problems with concentration and remembering things. She feels worthless, unsure of herself. She has no suicidal or homicidal thoughts, no hallucinations, delusions. She has no prior history of depression, no hx of mania. She does nt abuse alcohol or drugs. She worries about her health ever since she was diagnosed with breast cancer in July last year. She had a surgery in September 2020, went through radiation therapy and is currently on Tamoxifen. She was stated on citalopram by her PCP 6 months ago - currently on 40 mg. She was also placed on disability given her ongoing fatigue, cognitive and emotional problems. She works as a Radiation protection practitioner for [employer]. Long term disability was denied but it is under appeal review. [name] did not see any clear improvement in her depressive symptoms with citalopram. She noted that since she has been on that medication she feels more restless (particularly noticeable in the evening when she goes to bed) and started to gain some weight. We have cross tapered it to fluoxetine which she tolerates much better. More anxious (execssive worrying, ruminating) than  depressed at this time. She is now on 40 mg of fluoxetine and is in counseling with [name] last week and plans to continue. She is now using CPAP for OSA - sleep has initially improved from 4 to 6 hours per night but recently she has been having more problems with falling and staying asleep. She rates her mood at 7-8 with 10 being most depressed/anxious she ever felt. She lost her father soon after our last session and has been grieving. This may explain why it seems that her mood has not improved much despite doubling the dose of an antidepressant. She remains too anxious, depressed, and distractible to be able to adequately perform her job duties.  Visit Diagnosis:    ICD-10-CM   1. Major depressive disorder, single episode with anxious distress  F32.9   2. GAD (generalized anxiety disorder)  F41.1   3. Bereavement  Z63.4     Past Psychiatric History: Please see intake H&P.  Past Medical History:  Past Medical History:  Diagnosis Date  . Abnormal uterine bleeding (AUB)   . 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 Carpal tunnel syndrome   . Depression   . Family history of breast cancer   . HA (headache)   . Hypertension   . Migraines   . OSA (obstructive sleep apnea)    Mild OSA AHI 9.3/hr now on CPAP at 6CM H2O  . Personal history of radiation therapy   . Sleep apnea    not used CPAP in over a month    Past Surgical History:  Procedure Laterality Date  . ABLATION    . BREAST LUMPECTOMY Left 07/08/2019  . BREAST LUMPECTOMY WITH RADIOACTIVE SEED LOCALIZATION  Left 07/08/2019   Procedure: LEFT BREAST LUMPECTOMY X 3  WITH RADIOACTIVE SEED LOCALIZATION;  Surgeon: Erroll Luna, MD;  Location: [facility];  Service: General;  Laterality: Left;  . BREAST RECONSTRUCTION Left 07/15/2019   Procedure: LEFT BREAST ONCOPLASTIC RECONSTRUCTION;  Surgeon: Irene Limbo, MD;  Location: [facility];  Service: Plastics;  Laterality: Left;  . CESAREAN SECTION      x2  . DILATATION & CURETTAGE/HYSTEROSCOPY WITH MYOSURE N/A 06/04/2019   Procedure: DILATATION /HYSTEROSCOPY;  Surgeon: Janyth Pupa, DO;  Location: [place] [facility];  Service: Gynecology;  Laterality: N/A;  . LAPAROSCOPY N/A 06/04/2019   Procedure: LAPAROSCOPY Diagnostic, Repair Uterine Perforation;  Surgeon: Janyth Pupa, DO;  Location: [facility];  Service: Gynecology;  Laterality: N/A;  . MASTOPEXY Right 07/15/2019   Procedure: RIGHT BREAST MASTOPEXY;  Surgeon: Irene Limbo, MD;  Location: [facility];  Service: Plastics;  Laterality: Right;  . tummy tuck      Family Psychiatric History: None.  Family History:  Family History  Problem Relation Age of Onset  . Hypertension Mother   . Breast cancer Sister 20  . Breast cancer Maternal Aunt        dx 50s-60s  . Lung cancer Maternal Uncle   . Breast cancer Other        MGM's sister  . Other Sister 2       MVA  . Breast cancer Other        Mother's maternal cousins - x3    Social History:  Social History   Socioeconomic History  . Marital status: Married    Spouse name: [name]  . Number of children: 2  . Years of education: college  . Highest education level: Not on file  Occupational History    Comment: [employer]  Tobacco Use  . Smoking status: Never Smoker  . Smokeless tobacco: Never Used  Vaping Use  . Vaping Use: Never used  Substance and Sexual Activity  . Alcohol use: Yes    Alcohol/week: 0.0 standard drinks    Comment: social  . Drug use: No  . Sexual activity: Yes    Birth control/protection: Surgical    Comment: ablation  Other Topics Concern  . Not on file  Social History Narrative   Patient lives at home with her husband and two children.   Patient works full time for [employer].stopped in 2015, then at [employer].   Education college.   Right handed.   Caffeine two cup daily coffee.   Social Determinants of Health   Financial Resource Strain:   .  Difficulty of Paying Living Expenses: Not on file  Food Insecurity:   . Worried About Charity fundraiser in the Last Year: Not on file  . Ran Out of Food in the Last Year: Not on file  Transportation Needs:   . Lack of Transportation (Medical): Not on file  . Lack of Transportation (Non-Medical): Not on file  Physical Activity:   . Days of Exercise per Week: Not on file  . Minutes of Exercise per Session: Not on file  Stress:   . Feeling of Stress : Not on file  Social Connections:   . Frequency of Communication with Friends and Family: Not on file  . Frequency of Social Gatherings with Friends and Family: Not on file  . Attends Religious Services: Not on file  . Active Member of Clubs or Organizations: Not on file  . Attends Club  or Organization Meetings: Not on file  . Marital Status: Not on file    Allergies:  Allergies  Allergen Reactions  . Imitrex [Sumatriptan] Other (See Comments)    Nauseated - Feel like passing out - lack of therapeutic effect.  . Hydrocil [Psyllium] Nausea And Vomiting  . Other     "some kind of pain medicine a long time ago made me feel nauseous"     Metabolic Disorder Labs: No results found for: HGBA1C, MPG No results found for: PROLACTIN No results found for: CHOL, TRIG, HDL, CHOLHDL, VLDL, LDLCALC No results found for: TSH  Therapeutic Level Labs: No results found for: LITHIUM No results found for: VALPROATE No components found for:  CBMZ  Current Medications: Current Outpatient Medications  Medication Sig Dispense Refill  . ARIPiprazole (ABILIFY) 2 MG tablet Take 1 tablet (2 mg total) by mouth daily. 30 tablet 1  . Cholecalciferol (VITAMIN D3) 125 MCG (5000 UT) CAPS Take 5,000 capsules by mouth.    . clindamycin (CLEOCIN T) 1 % lotion APPLY A SMALL AMOUNT TO SKIN TWICE A DAY AS NEEDED    . FERREX 150 150 MG capsule Take by mouth daily.    Marland Kitchen FLUoxetine (PROZAC) 40 MG capsule Take 1 capsule (40 mg total) by mouth daily. 90 capsule 0  .  hydrochlorothiazide (HYDRODIURIL) 25 MG tablet Take 25 mg by mouth daily.    . hydroquinone 4 % cream APPLY A SMALL AMOUNT TOPICALLY EVERY DAY TO THE SKIN    . ibuprofen (ADVIL) 800 MG tablet Take 800 mg by mouth as needed.    . Multiple Vitamins-Minerals (HAIR SKIN AND NAILS FORMULA PO) Take as directed    . nabumetone (RELAFEN) 500 MG tablet Take 500 mg by mouth 2 (two) times daily as needed.    . naproxen (NAPROSYN) 500 MG tablet TAKE 1 TABLET BY MOUTH EVERY 12 HOURS AS NEEDED FOR PAIN 30 DAYS    . potassium chloride (KLOR-CON) 10 MEQ tablet Take 1 tablet (10 mEq total) by mouth daily. 90 tablet 4  . tamoxifen (NOLVADEX) 20 MG tablet Take 20 mg by mouth daily.    . vitamin B-12 (CYANOCOBALAMIN) 500 MCG tablet Take 500 mcg by mouth daily.    Marland Kitchen zolpidem (AMBIEN CR) 6.25 MG CR tablet Take 1 tablet (6.25 mg total) by mouth at bedtime as needed for sleep. 30 tablet 1   No current facility-administered medications for this visit.      Psychiatric Specialty Exam: Review of Systems  Psychiatric/Behavioral: Positive for decreased concentration and sleep disturbance. The patient is nervous/anxious.   All other systems reviewed and are negative.   There were no vitals taken for this visit.There is no height or weight on file to calculate BMI.  General Appearance: Casual and Fairly Groomed  Eye Contact:  Fair  Speech:  Clear and Coherent and Normal Rate  Volume:  Normal  Mood:  Anxious and Depressed  Affect:  Congruent and Constricted  Thought Process:  Goal Directed  Orientation:  Full (Time, Place, and Person)  Thought Content: Rumination   Suicidal Thoughts:  No  Homicidal Thoughts:  No  Memory:  Immediate;   Fair Recent;   Good Remote;   Good  Judgement:  Good  Insight:  Good  Psychomotor Activity:  Normal  Concentration:  Concentration: Fair  Recall:  [place] of Knowledge: Good  Language: Good  Akathisia:  Negative  Handed:  Right  AIMS (if indicated): not done  Assets:   Communication  Skills Desire for Improvement Housing Resilience Social Support  ADL's:  Intact  Cognition: WNL  Sleep:  Poor   Screenings: GAD-7     Counselor from 05/11/2020 in [place]  Total GAD-7 Score 13    PHQ2-9     Counselor from 05/11/2020 in [place]  PHQ-2 Total Score 2  PHQ-9 Total Score 12       Assessment and Plan:  54 yo MAAF with no past psychiatric history who has been referred for medication management of depression/anxiety after being seen by [name] LCSW. She reports approximately a year long problems with feeling depressed, irritable, excessively worried, tires, having difficulty falling asleep, problems with concentration and remembering things. She feels worthless, unsure of herself. She has no suicidal or homicidal thoughts, no hallucinations, delusions. She has no prior history of depression, no hx of mania. She does nt abuse alcohol or drugs. She worries about her health ever since she was diagnosed with breast cancer in July last year. She had a surgery in September 2020, went through radiation therapy and is currently on Tamoxifen. She was stated on citalopram by her PCP 6 months ago - currently on 40 mg. She was also placed on disability given her ongoing fatigue, cognitive and emotional problems. She works as a Radiation protection practitioner for [employer]. Long term disability was denied but it is under appeal review. Shanaiya did not see any clear improvement in her depressive symptoms with citalopram. She noted that since she has been on that medication she feels more restless (particularly noticeable in the evening when she goes to bed) and started to gain some weight. We have cross tapered it to fluoxetine which she tolerates much better. More anxious (execssive worrying, ruminating) than depressed at this time. She is now on 40 mg of fluoxetine and is in counseling with [name] last  week and plans to continue. She is now using CPAP for OSA - sleep has initially improved from 4 to 6 hours per night but recently she has been having more problems with falling and staying asleep. She rates her mood at 7-8 with 10 being most depressed/anxious she ever felt. She lost her father soon after our last session and has been grieving. This may explain why it seems that her mood has not improved much despite doubling the dose of an antidepressant. She remains too anxious, depressed, and distractible to be able to adequately perform her job duties.  Dx: Major depressive disorder single episode moderate; GAD; Bereavement  Plan: We will add low 2 mg dose of aripiprazole to augment fluoxetine 40 mg daily. I will also add zolpidem CR 6.25 mg prn insomnia (she tried antihistamines OTC w/o any benefit).  She will continue counseling with [name]. Next appointment with me in 5 weeks. The plan was discussed with patient who had an opportunity to ask questions and these were all answered. I spend15 minutes invideoconferencingwith the patient. Visit Diagnosis:     Stephanie Acre, MD 06/18/2020, 11:26 AM

## 2020-06-29 ENCOUNTER — Other Ambulatory Visit: Payer: Self-pay

## 2020-06-29 ENCOUNTER — Ambulatory Visit (INDEPENDENT_AMBULATORY_CARE_PROVIDER_SITE_OTHER): Payer: No Typology Code available for payment source | Admitting: Licensed Clinical Social Worker

## 2020-06-29 DIAGNOSIS — F411 Generalized anxiety disorder: Secondary | ICD-10-CM

## 2020-06-29 DIAGNOSIS — F321 Major depressive disorder, single episode, moderate: Secondary | ICD-10-CM | POA: Diagnosis not present

## 2020-06-29 NOTE — Progress Notes (Signed)
Virtual Visit via Video Note   I connected with Kara [name] on 06/29/20 at 11:00am by video enabled telemedicine application and verified that I am speaking with the correct person using two identifiers.   I discussed the limitations, risks, security and privacy concerns of performing an evaluation and management service by telephone and the availability of in person appointments. I also discussed with the patient that there may be a patient responsible charge related to this service. The patient expressed understanding and agreed to proceed.   I discussed the assessment and treatment plan with the patient. The patient was provided an opportunity to ask questions and all were answered. The patient agreed with the plan and demonstrated an understanding of the instructions.   The patient was advised to call back or seek an in-person evaluation if the symptoms worsen or if the condition fails to improve as anticipated.   I provided 1 hour of non-face-to-face time during this encounter.     [name], LCSW, LCAS ____________________ THERAPIST PROGRESS NOTE   Session Time: 11:00am - 12:00pm   Location: Patient: Patient Home Provider: [facility]    Participation Level: Active   Behavioral Response: Alert, casually dressed, neatly groomed, depressed mood/affect   Type of Therapy:  Individual Therapy   Treatment Goals addressed: Anxiety/depression management; Medication management   Interventions: CBT, positive affirmations   Summary: Kara [name] is a 54 year old married African American female that presented for virtual therapy today with diagnoses of Major Depressive Disorder, Single Episode, Moderate, and Generalized Anxiety Disorder.     Suicidal/Homicidal: None; without plan or intent.     Therapist Response: Clinician met with Kara [name] for virtual therapy session and assessed for safety, sobriety, and medication compliance.  Kara [name] presented for session on time and was  alert, oriented x5, with no evidence or self-report of active SI/HI or A/V H.  Kara [name] reported compliance with current medication and noted that she was started on an additional antidepressant in previous week.  She denied any abuse of alcohol or illicit substances.  Clinician inquired about Kara [name]'s emotional ratings today, as well as any significant changes in thoughts, feelings, or behavior since last check-in.  Kara [name] reported scores of 9/10 for depression and 8/10 for anxiety, stating I've been a little down because I found out my job isn't going to pay for the time I've been off, so I'm dealing with that and what to do next".  Kara [name] reported that this news has made it hard to get motivated some days and get things done, so clinician covered topic of positive affirmations today to provide her with examples of uplifting statements which she could use to challenge and overcome self-sabotaging patterns of thinking.  Clinician covered several categories of affirmations with Kara [name], including ones to boost self-esteem, handle aversity, improve outlook for future, and reduce unproductive comparison to other people.  Interventions were effective, as evidenced by Kara [name] engaging in discussion on subject and noting that several of these were appealing to her, such as "I matter and what I have to offer this world also matters", "Everything works out for the best possible good", and "All of my problems have solutions".  Kara [name] reported that she would try to practice several of these throughout the week and stated "I think they could bring me up when I'm down and improve my attitude".  Clinician will continue to monitor.   Plan: Follow up again in 2 weeks.       Diagnosis: Major  Depressive Disorder, Single Episode, Moderate, and Generalized Anxiety Disorder   [name], LCSW, LCAS 06/29/20

## 2020-07-13 ENCOUNTER — Other Ambulatory Visit: Payer: Self-pay

## 2020-07-13 ENCOUNTER — Ambulatory Visit (INDEPENDENT_AMBULATORY_CARE_PROVIDER_SITE_OTHER): Payer: No Typology Code available for payment source | Admitting: Licensed Clinical Social Worker

## 2020-07-13 DIAGNOSIS — F411 Generalized anxiety disorder: Secondary | ICD-10-CM | POA: Diagnosis not present

## 2020-07-13 DIAGNOSIS — F321 Major depressive disorder, single episode, moderate: Secondary | ICD-10-CM | POA: Diagnosis not present

## 2020-07-13 NOTE — Progress Notes (Signed)
Virtual Visit via Video Note   I connected with Kara [name] on 07/13/20 at 10:00am by video enabled telemedicine application and verified that I am speaking with the correct person using two identifiers.   I discussed the limitations, risks, security and privacy concerns of performing an evaluation and management service by video and the availability of in person appointments. I also discussed with the patient that there may be a patient responsible charge related to this service. The patient expressed understanding and agreed to proceed.   I discussed the assessment and treatment plan with the patient. The patient was provided an opportunity to ask questions and all were answered. The patient agreed with the plan and demonstrated an understanding of the instructions.   The patient was advised to call back or seek an in-person evaluation if the symptoms worsen or if the condition fails to improve as anticipated.   I provided 1 hour of non-face-to-face time during this encounter.     [name], LCSW, LCAS ____________________ THERAPIST PROGRESS NOTE   Session Time: 10:00am - 11:00am  Location: Patient: Patient Home Provider: [facility]    Participation Level: Active   Behavioral Response: Alert, casually dressed, neatly groomed, depressed mood/affect   Type of Therapy:  Individual Therapy   Treatment Goals addressed: Anxiety/depression management; Medication management   Interventions: CBT, sleep hygiene, gratitude journaling   Summary: Kara [name] is a 54 year old married African American female that presented for virtual therapy today with diagnoses of Major Depressive Disorder, Single Episode, Moderate, and Generalized Anxiety Disorder.     Suicidal/Homicidal: None; without plan or intent.     Therapist Response: Clinician met with Ottilie for virtual therapy appointment and assessed for safety, sobriety, and medication compliance.  [name] presented for this appointment  on time and was alert, oriented x5, with no evidence or self-report of active SI/HI or A/V H.  [name] reported that she continues taking medication as prescribed and denied any abuse of alcohol or illicit substances.  Clinician inquired about [name]'s current emotional ratings, as well as any significant changes in thoughts, feelings, or behavior since previous check-in.  [name] reported scores of 7.5/10 for both depression and anxiety, stating "I did have a panic attack on Saturday when I was at my friends house".  Clinician inquired about any triggers present before this episode, and how Ebba attempted to cope with related symptoms.  [name] reported that they had previously been discussing work struggles, and after around 10 minutes, she felt her heart racing, so she informed her friend, who encouraged her to sit down and breathe through it, which helped.  Clinician encouraged [name] to continue practicing 5-4-3-2-1 grounding technique as an additional tool when these episodes occur, and offered to explore additional skills today which could assist.  [name] reported that she wished to focus time on problems with sleep, and depression instead, noting that she is only sleeping 4 hours per night on average, and continues to deal with depression interfering with outlook and motivation daily.  Clinician discussed sleep hygiene techniques with [name], such as keeping a regular routine, avoiding use of electronics late at night before bed, following healthy diet, and getting ample exercise during the day to reinforce tiredness at bedtime.  [name] reported that she used to be very active, running 3 miles x3 per week as part of normal routine, and eating several times per day to replenish energy, but noted that this has declined during recent depressive episode. Clinician assisted [name]  with setting realistic, achievable goal for reengaging in exercise in upcoming days ahead.  [name] reported that she would aim to get on her elliptical x2  this week for 15-20 minutes to start, and monitor her sleep to see if this improves as a result of increased physical activity.  Clinician also explored root of [name]'s recent depressive thoughts influencing mood, and strategies for resolving them.  [name] reported that she is in the process of arranging leave accommodations for her job by early October and ruminates on idea of herself as a failure, or too old to begin a new career at times.  Clinician assisted [name] with challenging these automatic negative thoughts by identifying evidence which contradicts them and improves self-esteem.  Clinician also encouraged [name] to continue practice of daily positive affirmations, as well as gratitude journaling.  Clinician provided [name] with some gratitude prompts today as well, such as "What do you have in your life today that you didn't have 1 year ago", and "What is something to look forward to this week".  Interventions were effective, as evidenced by [name] reporting that this session was helpful for shifting her focus away from negative aspects of her life over to more positive areas, which improved her outlook and confidence for tackling future challenges.  [name] was also able to acknowledge points of gratitude in her life such as having new hobby to engage in with a friend, having a supportive family to lift her spirits, and getting accepted into current program through school to further her educational and career options.  Clinician will continue to monitor.   Plan: Follow up again in 2 weeks.       Diagnosis: Major Depressive Disorder, Single Episode, Moderate, and Generalized Anxiety Disorder   [name], LCSW, LCAS 07/13/20

## 2020-07-15 ENCOUNTER — Other Ambulatory Visit (HOSPITAL_COMMUNITY): Payer: Self-pay | Admitting: Psychiatry

## 2020-07-21 ENCOUNTER — Telehealth (INDEPENDENT_AMBULATORY_CARE_PROVIDER_SITE_OTHER): Payer: No Typology Code available for payment source | Admitting: Psychiatry

## 2020-07-21 ENCOUNTER — Other Ambulatory Visit: Payer: Self-pay

## 2020-07-21 DIAGNOSIS — F410 Panic disorder [episodic paroxysmal anxiety] without agoraphobia: Secondary | ICD-10-CM | POA: Diagnosis not present

## 2020-07-21 DIAGNOSIS — F411 Generalized anxiety disorder: Secondary | ICD-10-CM

## 2020-07-21 DIAGNOSIS — F329 Major depressive disorder, single episode, unspecified: Secondary | ICD-10-CM

## 2020-07-21 MED ORDER — ARIPIPRAZOLE 2 MG PO TABS: 2.0000 mg | ORAL_TABLET | Freq: Every day | ORAL | 1 refills | Status: DC

## 2020-07-21 MED ORDER — LORAZEPAM 1 MG PO TABS
1.0000 mg | ORAL_TABLET | Freq: Every day | ORAL | 1 refills | Status: AC | PRN
Start: 2020-07-21 — End: 2020-09-19

## 2020-07-21 MED ORDER — ZOLPIDEM TARTRATE ER 12.5 MG PO TBCR: 12.5000 mg | EXTENDED_RELEASE_TABLET | Freq: Every evening | ORAL | 1 refills | Status: DC | PRN

## 2020-07-21 MED ORDER — FLUOXETINE HCL 20 MG PO TABS: 60.0000 mg | ORAL_TABLET | Freq: Every day | ORAL | 2 refills | Status: DC

## 2020-07-21 NOTE — Progress Notes (Signed)
BH MD/PA/NP OP Progress Note  07/21/2020 10:52 AM Kara [name]  MRN:  [id number] Interview was conducted using videoconferencing application and I verified that I was speaking with the correct person using two identifiers. I discussed the limitations of evaluation and management by telemedicine and  the availability of in person appointments. Patient expressed understanding and agreed to proceed. Patient location - home; physician - home office.  Chief Complaint: Depressed worried, late insomnia, new onset panic attacks.  HPI: 54 yo MAAF with no past psychiatric history who has been referred for medication management of depression/anxiety after being seen by [name] LCSW. She reports approximately a year long problems with feeling depressed, irritable, excessively worried, tires, having difficulty falling asleep, problems with concentration and remembering things. She feels worthless, unsure of herself. She has no suicidal or homicidal thoughts, no hallucinations, delusions. She has no prior history of depression, no hx of mania. She does nt abuse alcohol or drugs. Sheworries about her health ever since she was diagnosed with breast cancerin July last year. She had a surgery in September 2020, went through radiation therapy and is currently on Tamoxifen. She was stated on citalopram by her PCP 6 months ago - currently on 40 mg. She was also placed on disability given her ongoing fatigue, cognitive and emotional problems. She works as a Radiation protection practitioner for [employer]. Long term disability was denied she does not know the rationale for that decision. Kara [name] didnot see any clear improvement in her depressive symptoms with citalopram. She noted that since she has been on that medication she feels more restless (particularly noticeable in the evening when she goes to bed) and started to gain some weight. We have cross tapered it to fluoxetine which she tolerates much better. More  anxious (execssive worrying, ruminating) than depressed at this time. She is now on 40 mg of fluoxetine and is in counseling with [name] last week and plans to continue. She is now using CPAP for OSA - sleep has initially improved from 4 to 6 hours per night but recently she has been having more problems with falling and staying asleep. She rates her mood at 7-8 with 10 being most depressed/anxious she ever felt. She reports new onset panic attacks (twice last week) - palpitations, tension, SOP - no clear triggers. First lasted 20 minutes second was shorter. We have added  aripiprazole to fluoxetine but no clear improvement in mood is seen. She remains too anxious, depressed, and distractible to be able to adequately perform her job duties. She would also like to be tested for ADHD - chronic problems with focusing, forgetfulness. I suggests she calls Dr. Daron Offer office for that.     ICD-10-CM   1. Major depressive disorder, single episode with anxious distress  F32.9   2. GAD (generalized anxiety disorder)  F41.1   3. Panic disorder  F41.0     Past Psychiatric History: Please see intake H&P.  Past Medical History:  Past Medical History:  Diagnosis Date  . Abnormal uterine bleeding (AUB)   . 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 Carpal tunnel syndrome   . Depression   . Family history of breast cancer   . HA (headache)   . Hypertension   . Migraines   . OSA (obstructive sleep apnea)    Mild OSA AHI 9.3/hr now on CPAP at 6CM H2O  . Personal history of radiation therapy   . Sleep apnea    not used  CPAP in over a month    Past Surgical History:  Procedure Laterality Date  . ABLATION    . BREAST LUMPECTOMY Left 07/08/2019  . BREAST LUMPECTOMY WITH RADIOACTIVE SEED LOCALIZATION Left 07/08/2019   Procedure: LEFT BREAST LUMPECTOMY X 3  WITH RADIOACTIVE SEED LOCALIZATION;  Surgeon: Erroll Luna, MD;  Location: [facility];  Service: General;  Laterality: Left;  .  BREAST RECONSTRUCTION Left 07/15/2019   Procedure: LEFT BREAST ONCOPLASTIC RECONSTRUCTION;  Surgeon: Irene Limbo, MD;  Location: [facility];  Service: Plastics;  Laterality: Left;  . CESAREAN SECTION     x2  . DILATATION & CURETTAGE/HYSTEROSCOPY WITH MYOSURE N/A 06/04/2019   Procedure: DILATATION /HYSTEROSCOPY;  Surgeon: Janyth Pupa, DO;  Location: [facility];  Service: Gynecology;  Laterality: N/A;  . LAPAROSCOPY N/A 06/04/2019   Procedure: LAPAROSCOPY Diagnostic, Repair Uterine Perforation;  Surgeon: Janyth Pupa, DO;  Location: [facility];  Service: Gynecology;  Laterality: N/A;  . MASTOPEXY Right 07/15/2019   Procedure: RIGHT BREAST MASTOPEXY;  Surgeon: Irene Limbo, MD;  Location: [facility];  Service: Plastics;  Laterality: Right;  . tummy tuck      Family Psychiatric History: None.  Family History:  Family History  Problem Relation Age of Onset  . Hypertension Mother   . Breast cancer Sister 69  . Breast cancer Maternal Aunt        dx 50s-60s  . Lung cancer Maternal Uncle   . Breast cancer Other        MGM's sister  . Other Sister 39       MVA  . Breast cancer Other        Mother's maternal cousins - x3    Social History:  Social History   Socioeconomic History  . Marital status: Married    Spouse name: [name]  . Number of children: 2  . Years of education: college  . Highest education level: Not on file  Occupational History    Comment: [employer]  Tobacco Use  . Smoking status: Never Smoker  . Smokeless tobacco: Never Used  Vaping Use  . Vaping Use: Never used  Substance and Sexual Activity  . Alcohol use: Yes    Alcohol/week: 0.0 standard drinks    Comment: social  . Drug use: No  . Sexual activity: Yes    Birth control/protection: Surgical    Comment: ablation  Other Topics Concern  . Not on file  Social History Narrative   Patient lives at home with her husband and two children.    Patient works full time for [employer].stopped in 2015, then at [employer].   Education college.   Right handed.   Caffeine two cup daily coffee.   Social Determinants of Health   Financial Resource Strain:   . Difficulty of Paying Living Expenses: Not on file  Food Insecurity:   . Worried About Charity fundraiser in the Last Year: Not on file  . Ran Out of Food in the Last Year: Not on file  Transportation Needs:   . Lack of Transportation (Medical): Not on file  . Lack of Transportation (Non-Medical): Not on file  Physical Activity:   . Days of Exercise per Week: Not on file  . Minutes of Exercise per Session: Not on file  Stress:   . Feeling of Stress : Not on file  Social Connections:   . Frequency of Communication with Friends and Family: Not on file  .  Frequency of Social Gatherings with Friends and Family: Not on file  . Attends Religious Services: Not on file  . Active Member of Clubs or Organizations: Not on file  . Attends Archivist Meetings: Not on file  . Marital Status: Not on file    Allergies:  Allergies  Allergen Reactions  . Imitrex [Sumatriptan] Other (See Comments)    Nauseated - Feel like passing out - lack of therapeutic effect.  . Hydrocil [Psyllium] Nausea And Vomiting  . Other     "some kind of pain medicine a long time ago made me feel nauseous"     Metabolic Disorder Labs: No results found for: HGBA1C, MPG No results found for: PROLACTIN No results found for: CHOL, TRIG, HDL, CHOLHDL, VLDL, LDLCALC No results found for: TSH  Therapeutic Level Labs: No results found for: LITHIUM No results found for: VALPROATE No components found for:  CBMZ  Current Medications: Current Outpatient Medications  Medication Sig Dispense Refill  . ARIPiprazole (ABILIFY) 2 MG tablet Take 1 tablet (2 mg total) by mouth daily. 30 tablet 1  . Cholecalciferol (VITAMIN D3) 125 MCG (5000 UT) CAPS Take 5,000 capsules by mouth.    . clindamycin (CLEOCIN T) 1  % lotion APPLY A SMALL AMOUNT TO SKIN TWICE A DAY AS NEEDED    . FERREX 150 150 MG capsule Take by mouth daily.    Marland Kitchen FLUoxetine (PROZAC) 20 MG tablet Take 3 tablets (60 mg total) by mouth daily. 90 tablet 2  . hydrochlorothiazide (HYDRODIURIL) 25 MG tablet Take 25 mg by mouth daily.    . hydroquinone 4 % cream APPLY A SMALL AMOUNT TOPICALLY EVERY DAY TO THE SKIN    . ibuprofen (ADVIL) 800 MG tablet Take 800 mg by mouth as needed.    Marland Kitchen LORazepam (ATIVAN) 1 MG tablet Take 1 tablet (1 mg total) by mouth daily as needed for anxiety. 30 tablet 1  . Multiple Vitamins-Minerals (HAIR SKIN AND NAILS FORMULA PO) Take as directed    . nabumetone (RELAFEN) 500 MG tablet Take 500 mg by mouth 2 (two) times daily as needed.    . naproxen (NAPROSYN) 500 MG tablet TAKE 1 TABLET BY MOUTH EVERY 12 HOURS AS NEEDED FOR PAIN 30 DAYS    . potassium chloride (KLOR-CON) 10 MEQ tablet Take 1 tablet (10 mEq total) by mouth daily. 90 tablet 4  . tamoxifen (NOLVADEX) 20 MG tablet Take 20 mg by mouth daily.    . vitamin B-12 (CYANOCOBALAMIN) 500 MCG tablet Take 500 mcg by mouth daily.    Marland Kitchen zolpidem (AMBIEN CR) 12.5 MG CR tablet Take 1 tablet (12.5 mg total) by mouth at bedtime as needed for sleep. 30 tablet 1   No current facility-administered medications for this visit.     Psychiatric Specialty Exam: Review of Systems  Constitutional: Positive for fatigue.  Psychiatric/Behavioral: Positive for decreased concentration and sleep disturbance. The patient is nervous/anxious.   All other systems reviewed and are negative.   There were no vitals taken for this visit.There is no height or weight on file to calculate BMI.  General Appearance: Casual and Well Groomed  Eye Contact:  Good  Speech:  Clear and Coherent and Normal Rate  Volume:  Normal  Mood:  Anxious and Depressed  Affect:  Constricted  Thought Process:  Goal Directed  Orientation:  Full (Time, Place, and Person)  Thought Content: Rumination   Suicidal  Thoughts:  No  Homicidal Thoughts:  No  Memory:  Immediate;  Fair Recent;   Fair Remote;   Good  Judgement:  Good  Insight:  Good  Psychomotor Activity:  Normal  Concentration:  Concentration: Fair  Recall:  [place] of Knowledge: Good  Language: Good  Akathisia:  Negative  Handed:  Right  AIMS (if indicated): not done  Assets:  Communication Skills Desire for Improvement Financial Resources/Insurance Housing Social Support  ADL's:  Intact  Cognition: WNL  Sleep:  Fair   Screenings: GAD-7     Counselor from 05/11/2020 in [facility]  Total GAD-7 Score 13    PHQ2-9     Counselor from 05/11/2020 in [facility]  PHQ-2 Total Score 2  PHQ-9 Total Score 12       Assessment and Plan: 54 yo MAAF with no past psychiatric history who has been referred for medication management of depression/anxiety after being seen by [name] LCSW. She reports approximately a year long problems with feeling depressed, irritable, excessively worried, tires, having difficulty falling asleep, problems with concentration and remembering things. She feels worthless, unsure of herself. She has no suicidal or homicidal thoughts, no hallucinations, delusions. She has no prior history of depression, no hx of mania. She does nt abuse alcohol or drugs. Sheworries about her health ever since she was diagnosed with breast cancerin July last year. She had a surgery in September 2020, went through radiation therapy and is currently on Tamoxifen. She was stated on citalopram by her PCP 6 months ago - currently on 40 mg. She was also placed on disability given her ongoing fatigue, cognitive and emotional problems. She works as a Radiation protection practitioner for [employer]. Long term disability was denied she does not know the rationale for that decision. Eydie didnot see any clear improvement in her depressive symptoms with citalopram. She noted  that since she has been on that medication she feels more restless (particularly noticeable in the evening when she goes to bed) and started to gain some weight. We have cross tapered it to fluoxetine which she tolerates much better. More anxious (execssive worrying, ruminating) than depressed at this time. She is now on 40 mg of fluoxetine and is in counseling with [name] last week and plans to continue. She is now using CPAP for OSA - sleep has initially improved from 4 to 6 hours per night but recently she has been having more problems with falling and staying asleep. She rates her mood at 7-8 with 10 being most depressed/anxious she ever felt. She reports new onset panic attacks (twice last week) - palpitations, tension, SOP - no clear triggers. First lasted 20 minutes second was shorter. We have added  aripiprazole to fluoxetine but no clear improvement in mood is seen. She remains too anxious, depressed, and distractible to be able to adequately perform her job duties. She would also like to be tested for ADHD - chronic problems with focusing, forgetfulness. I suggests she calls Dr. Daron Offer office for that.  Dx: Major depressive disorder single episode moderate; GAD; Panic disorder  Plan: We willcontinue 2 mg dose of aripiprazole, increase fluoxetine to 60 mgdaily, zolpidem CR to 12.5 mg prn insomnia and lorazepam 1 mg prn panic attacks. She will continue counseling withCory Redmond Baseman.Next appointment with me in 2 months. The plan was discussed with patient who had an opportunity to ask questions and these were all answered. I spend20 minutes invideoconferencingwith the patient.   Stephanie Acre, MD 07/21/2020, 10:52 AM

## 2020-07-30 ENCOUNTER — Ambulatory Visit (HOSPITAL_COMMUNITY): Payer: No Typology Code available for payment source | Admitting: Licensed Clinical Social Worker

## 2020-07-30 ENCOUNTER — Other Ambulatory Visit: Payer: Self-pay

## 2020-08-02 ENCOUNTER — Telehealth (HOSPITAL_COMMUNITY): Payer: Self-pay | Admitting: Licensed Clinical Social Worker

## 2020-08-02 ENCOUNTER — Other Ambulatory Visit: Payer: Self-pay

## 2020-08-02 ENCOUNTER — Ambulatory Visit (HOSPITAL_COMMUNITY): Payer: No Typology Code available for payment source | Admitting: Licensed Clinical Social Worker

## 2020-08-02 NOTE — Telephone Encounter (Signed)
Kara [name] had a virtual therapy appointment scheduled today at 11am.  Clinician contacted her by phone at 11:05am when she did not appear after email and text reminders were sent.  Kara [name] did not answer phone call, so a voicemail was left reminding her of this appointment, and provided contact numbers for office and front desk.  Clinician ended virtual session at 11:15am when Kara [name] had not returned call or presented for meeting.  Clinician informed front desk of no-show event.    [name], LCSW, LCAS 08/02/20

## 2020-08-15 ENCOUNTER — Other Ambulatory Visit (HOSPITAL_COMMUNITY): Payer: Self-pay | Admitting: Psychiatry

## 2020-08-17 ENCOUNTER — Other Ambulatory Visit: Payer: Self-pay

## 2020-08-17 ENCOUNTER — Ambulatory Visit (INDEPENDENT_AMBULATORY_CARE_PROVIDER_SITE_OTHER): Payer: No Typology Code available for payment source | Admitting: Licensed Clinical Social Worker

## 2020-08-17 DIAGNOSIS — F321 Major depressive disorder, single episode, moderate: Secondary | ICD-10-CM

## 2020-08-17 DIAGNOSIS — F411 Generalized anxiety disorder: Secondary | ICD-10-CM | POA: Diagnosis not present

## 2020-08-17 NOTE — Progress Notes (Signed)
Virtual Visit via Video Note   I connected with Kara [name] on 08/17/20 at 10:00am by video enabled telemedicine application and verified that I am speaking with the correct person using two identifiers.   I discussed the limitations, risks, security and privacy concerns of performing an evaluation and management service by video and the availability of in person appointments. I also discussed with the patient that there may be a patient responsible charge related to this service. The patient expressed understanding and agreed to proceed.   I discussed the assessment and treatment plan with the patient. The patient was provided an opportunity to ask questions and all were answered. The patient agreed with the plan and demonstrated an understanding of the instructions.   The patient was advised to call back or seek an in-person evaluation if the symptoms worsen or if the condition fails to improve as anticipated.   I provided 45 minutes of non-face-to-face time during this encounter.     [name], LCSW, LCAS ____________________ THERAPIST PROGRESS NOTE   Session Time: 10:00am - 10:45am  Location: Patient: Patient Home Provider: [facility] Office    Participation Level: Active   Behavioral Response: Alert, casually dressed, neatly groomed, depressed mood/affect   Type of Therapy:  Individual Therapy   Treatment Goals addressed: Anxiety/depression management; Medication management   Interventions: CBT, strengths based   Summary: Kara [name] is a 54 year old married African American female that presented for virtual therapy today with diagnoses of Major Depressive Disorder, Single Episode, Moderate, and Generalized Anxiety Disorder.     Suicidal/Homicidal: None; without plan or intent.     Therapist Response: Clinician met with Kashvi for virtual session and assessed for safety, sobriety, and medication compliance.  Kara [name] presented for today's therapy session on time and was  alert, oriented x5, with no evidence or self-report of active SI/HI or A/V H.  Sybel reported ongoing compliant with medication and denied any abuse of alcohol or illicit substances.  Clinician inquired about [name]'s emotional ratings today, as well as any significant changes in thoughts, feelings, or behavior since last check-in.  Kara [name] reported scores of 7/10 for both depression and anxiety, but denied any reoccurrence of panic attacks.  Clinician inquired about Kara [name]'s successes and struggles experienced since last appointment.  [name] reported that she has been trying to focus more upon positive things each day, work towards completing her classes, and apply for new jobs, but she has found it hard at times to stay optimistic.  Clinician went through handout in session with Kara [name] today focused upon identifying her personal strengths (I.e. creativity, bravery, humility, flexibility, persistence, etc), as well as ways to utilize these in following week to accomplish personal goals, increase self-esteem, and improve outlook.  Interventions were effective, as evidenced by Kara [name] participating in activity and reporting that this made her more aware of personal strengths such as forgiveness, honesty, spirituality, and gratitude, as well as strategies for reinforcing these during following week, such as reaching out to family and friends to show appreciation, challenging herself to try new things, and being willing to open up to supports when she needs help.  She stated "I think I took a lot of these for granted, but I'm going to focus more on my strengths and remind myself that things will work out if I keep my head up".  Clinician will continue to monitor.   Plan: Follow up again in 2 weeks.       Diagnosis: Major Depressive Disorder,  Single Episode, Moderate, and Generalized Anxiety Disorder   [name], LCSW, LCAS 08/17/20

## 2020-08-19 ENCOUNTER — Other Ambulatory Visit (HOSPITAL_COMMUNITY): Payer: Self-pay | Admitting: Psychiatry

## 2020-08-19 ENCOUNTER — Encounter (HOSPITAL_COMMUNITY): Payer: Self-pay

## 2020-09-07 ENCOUNTER — Other Ambulatory Visit: Payer: Self-pay

## 2020-09-07 ENCOUNTER — Ambulatory Visit (INDEPENDENT_AMBULATORY_CARE_PROVIDER_SITE_OTHER): Payer: No Typology Code available for payment source | Admitting: Licensed Clinical Social Worker

## 2020-09-07 DIAGNOSIS — F321 Major depressive disorder, single episode, moderate: Secondary | ICD-10-CM | POA: Diagnosis not present

## 2020-09-07 DIAGNOSIS — F411 Generalized anxiety disorder: Secondary | ICD-10-CM | POA: Diagnosis not present

## 2020-09-07 NOTE — Progress Notes (Signed)
Virtual Visit via Video Note   I connected with Kara [name] on 09/07/20 at 10:00am by video enabled telemedicine application and verified that I am speaking with the correct person using two identifiers.   I discussed the limitations, risks, security and privacy concerns of performing an evaluation and management service by video and the availability of in person appointments. I also discussed with the patient that there may be a patient responsible charge related to this service. The patient expressed understanding and agreed to proceed.   I discussed the assessment and treatment plan with the patient. The patient was provided an opportunity to ask questions and all were answered. The patient agreed with the plan and demonstrated an understanding of the instructions.    The patient was advised to call back or seek an in-person evaluation if the symptoms worsen or if the condition fails to improve as anticipated.   I provided 30 minutes of non-face-to-face time during this encounter.     Kara Flood, LCSW, LCAS ____________________ THERAPIST PROGRESS NOTE   Session Time: 10:00am - 10:30am  Location: Patient: Patient Home Provider: [facility]    Participation Level: Active   Behavioral Response: Alert, casually dressed, neatly groomed, anxious mood/affect   Type of Therapy:  Individual Therapy   Treatment Goals addressed: Anxiety/depression management; Medication management; Psychiatry followup    Interventions: CBT, ADHD screening/psychoeducation    Summary: Kara [name] is a 54 year old married African American female that presented for virtual therapy today with diagnoses of Major Depressive Disorder, Single Episode, Moderate, and Generalized Anxiety Disorder.    Suicidal/Homicidal: None; without plan or intent.     Therapist Response: Clinician met with Kara [name] for virtual appointment and assessed for safety, sobriety, and medication compliance.  Kara [name] presented for  today's session on time and was alert, oriented x5, with no evidence or self-report of active SI/HI or A/V H.  Kara [name] reported that she continues taking medication as prescribed and denied any abuse of alcohol or illicit substances.  Clinician inquired about Kara [name]'s current emotional ratings today, as well as any significant changes in thoughts, feelings, or behavior since previous check-in.  Kara [name] reported scores of 7/10 for depression, 10/10 for anxiety, and 3/10 for anger, but denied experiencing any panic attacks.  Clinician inquired about stressors contributing to Kara [name]'s heightened anxiety severity.  Kara [name] reported that she has felt overwhelmed between juggling schoolwork, issues regarding her job, and insurance.  She reported that she has also not been sleeping well due to being up late with racing thoughts, and finding excuses to avoid major tasks.  Clinician completed ADHD screening with Kara [name] in session based upon her reports of numerous clinically relevant symptoms being present over 6 months now, including making careless mistakes, trouble with sustaining attention on tasks, avoiding lengthy mental tasks, fidgeting frequently, restlessness, always feeling on the go, and more, all of which equally impact home and work related life.  Clinician discussed results with Kara [name] and provided psychoeducation on potential diagnosis, encouraging her to consider speaking with psychiatrist about appropriateness for medication that could alleviate some of the symptoms.  Kara [name] was agreeable to this and reported that she would outreach MD and/or nurse today about other options, since their original referral for a doctor specializing in this area fell through.  She engaged in a half session today due to not feeling well, reporting flu like symptoms.  She reported that she would take time to rest today following end of session, and is currently awaiting  COVID test results and will visit doctor if condition does not improve.   Clinician will continue to monitor.   Plan: Follow up again in 2 weeks.       Diagnosis: Major Depressive Disorder, Single Episode, Moderate, and Generalized Anxiety Disorder.   Kara [name], [place], LCAS 09/07/20

## 2020-09-11 ENCOUNTER — Other Ambulatory Visit (HOSPITAL_COMMUNITY): Payer: Self-pay | Admitting: Psychiatry

## 2020-09-21 ENCOUNTER — Other Ambulatory Visit (HOSPITAL_COMMUNITY): Payer: Self-pay | Admitting: Psychiatry

## 2020-09-22 ENCOUNTER — Telehealth (INDEPENDENT_AMBULATORY_CARE_PROVIDER_SITE_OTHER): Payer: No Typology Code available for payment source | Admitting: Psychiatry

## 2020-09-22 ENCOUNTER — Other Ambulatory Visit: Payer: Self-pay

## 2020-09-22 DIAGNOSIS — F329 Major depressive disorder, single episode, unspecified: Secondary | ICD-10-CM

## 2020-09-22 DIAGNOSIS — F410 Panic disorder [episodic paroxysmal anxiety] without agoraphobia: Secondary | ICD-10-CM

## 2020-09-22 DIAGNOSIS — F909 Attention-deficit hyperactivity disorder, unspecified type: Secondary | ICD-10-CM | POA: Insufficient documentation

## 2020-09-22 DIAGNOSIS — F411 Generalized anxiety disorder: Secondary | ICD-10-CM | POA: Diagnosis not present

## 2020-09-22 MED ORDER — BUPROPION HCL ER (XL) 150 MG PO TB24: 150.0000 mg | ORAL_TABLET | Freq: Every day | ORAL | 1 refills | Status: DC

## 2020-09-22 MED ORDER — LORAZEPAM 1 MG PO TABS: 1.0000 mg | ORAL_TABLET | Freq: Every day | ORAL | 1 refills | Status: AC | PRN

## 2020-09-22 MED ORDER — ESZOPICLONE 2 MG PO TABS
2.0000 mg | ORAL_TABLET | Freq: Every evening | ORAL | 0 refills | Status: AC | PRN
Start: 2020-09-22 — End: 2024-06-23

## 2020-09-22 NOTE — Progress Notes (Signed)
BH MD/PA/NP OP Progress Note  09/22/2020 10:56 AM Joory Maridee Slape  MRN:  [id number] Interview was conducted using videoconferencing application and I verified that I was speaking with the correct person using two identifiers. I discussed the limitations of evaluation and management by telemedicine and  the availability of in person appointments. Patient expressed understanding and agreed to proceed. Participants in the visit: patient (location - home); physician (location - home office).  Chief Complaint: Constant worrying, lack of focus/easily distracted, low energy, early awakening.  HPI: 54 yo MAAF with no past psychiatric history who has been referred for medication management of depression/anxiety after being seen by [name] LCSW. She reports approximately a year long problems with feeling depressed, irritable, excessively worried, tires, having difficulty falling asleep, problems with concentration and remembering things. She feels worthless, unsure of herself. She has no suicidal or homicidal thoughts, no hallucinations, delusions. She has no prior history of depression, no hx of mania. She does nt abuse alcohol or drugs. Sheworries about her health ever since she was diagnosed with breast cancerin July last year. She had a surgery in September 2020, went through radiation therapy and is currently on Tamoxifen. She was prescribed citalopram by her PCP up to 40 mg. She was also placed on disability given her ongoing fatigue, cognitive and emotional problems. She works as a Radiation protection practitioner for [employer]. Long term disability was denied she does not know the rationale for that decision. Scherry didnot see any clear improvement in her depressive symptoms with citalopram. She noted that since she has been on that medication she feels more restless (particularly noticeable in the evening when she goes to bed) and started to gain some weight. We have cross tapered it to fluoxetine  which she tolerates much better. More anxious (execssive worrying, ruminating) than depressed at this time. She isnow on 60mg  of fluoxetineand is in [place] last week and plans to continue. She is now using CPAP for OSA - sleep hasinitiallyimproved from 4 to 6 hours per night but recently she has been having more problems with falling and staying asleep. We have added aripiprazole for augmentation but her mood only minimally improved. Her panic attacks on the other hand resolved. Sheremainstoo anxious, depressed, and distractible to be able to adequately perform her job duties. She described having chronic problems with focusing, forgetfulness, task completion suggestive of ADD. She sleeps about 5 hours with  Zolpidem CR - better than before.    Visit Diagnosis:    ICD-10-CM   1. GAD (generalized anxiety disorder)  F41.1   2. Adult ADHD  F90.9   3. Panic disorder  F41.0   4. Major depressive disorder, single episode with anxious distress  F32.9     Past Psychiatric History: Please see intake H&P.  Past Medical History:  Past Medical History:  Diagnosis Date  . Abnormal uterine bleeding (AUB)   . 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 Carpal tunnel syndrome   . Depression   . Family history of breast cancer   . HA (headache)   . Hypertension   . Migraines   . OSA (obstructive sleep apnea)    Mild OSA AHI 9.3/hr now on CPAP at 6CM H2O  . Personal history of radiation therapy   . Sleep apnea    not used CPAP in over a month    Past Surgical History:  Procedure Laterality Date  . ABLATION    . BREAST LUMPECTOMY Left 07/08/2019  .  BREAST LUMPECTOMY WITH RADIOACTIVE SEED LOCALIZATION Left 07/08/2019   Procedure: LEFT BREAST LUMPECTOMY X 3  WITH RADIOACTIVE SEED LOCALIZATION;  Surgeon: Erroll Luna, MD;  Location: [facility];  Service: General;  Laterality: Left;  . BREAST RECONSTRUCTION Left 07/15/2019   Procedure: LEFT BREAST ONCOPLASTIC  RECONSTRUCTION;  Surgeon: Irene Limbo, MD;  Location: [facility];  Service: Plastics;  Laterality: Left;  . CESAREAN SECTION     x2  . DILATATION & CURETTAGE/HYSTEROSCOPY WITH MYOSURE N/A 06/04/2019   Procedure: DILATATION /HYSTEROSCOPY;  Surgeon: Janyth Pupa, DO;  Location: [facility];  Service: Gynecology;  Laterality: N/A;  . LAPAROSCOPY N/A 06/04/2019   Procedure: LAPAROSCOPY Diagnostic, Repair Uterine Perforation;  Surgeon: Janyth Pupa, DO;  Location: [facility];  Service: Gynecology;  Laterality: N/A;  . MASTOPEXY Right 07/15/2019   Procedure: RIGHT BREAST MASTOPEXY;  Surgeon: Irene Limbo, MD;  Location: [facility];  Service: Plastics;  Laterality: Right;  . tummy tuck      Family Psychiatric History: None.  Family History:  Family History  Problem Relation Age of Onset  . Hypertension Mother   . Breast cancer Sister 19  . Breast cancer Maternal Aunt        dx 50s-60s  . Lung cancer Maternal Uncle   . Breast cancer Other        MGM's sister  . Other Sister 54       MVA  . Breast cancer Other        Mother's maternal cousins - x3    Social History:  Social History   Socioeconomic History  . Marital status: Married    Spouse name: [name]  . Number of children: 2  . Years of education: college  . Highest education level: Not on file  Occupational History    Comment: [employer]  Tobacco Use  . Smoking status: Never Smoker  . Smokeless tobacco: Never Used  Vaping Use  . Vaping Use: Never used  Substance and Sexual Activity  . Alcohol use: Yes    Alcohol/week: 0.0 standard drinks    Comment: social  . Drug use: No  . Sexual activity: Yes    Birth control/protection: Surgical    Comment: ablation  Other Topics Concern  . Not on file  Social History Narrative   Patient lives at home with her husband and two children.   Patient works full time for [employer].stopped in 2015, then at [employer].    Education college.   Right handed.   Caffeine two cup daily coffee.   Social Determinants of Health   Financial Resource Strain:   . Difficulty of Paying Living Expenses: Not on file  Food Insecurity:   . Worried About Charity fundraiser in the Last Year: Not on file  . Ran Out of Food in the Last Year: Not on file  Transportation Needs:   . Lack of Transportation (Medical): Not on file  . Lack of Transportation (Non-Medical): Not on file  Physical Activity:   . Days of Exercise per Week: Not on file  . Minutes of Exercise per Session: Not on file  Stress:   . Feeling of Stress : Not on file  Social Connections:   . Frequency of Communication with Friends and Family: Not on file  . Frequency of Social Gatherings with Friends and Family: Not on file  . Attends Religious Services: Not on file  . Active Member of Clubs or Organizations: Not  on file  . Attends Archivist Meetings: Not on file  . Marital Status: Not on file    Allergies:  Allergies  Allergen Reactions  . Imitrex [Sumatriptan] Other (See Comments)    Nauseated - Feel like passing out - lack of therapeutic effect.  . Hydrocil [Psyllium] Nausea And Vomiting  . Other     "some kind of pain medicine a long time ago made me feel nauseous"     Metabolic Disorder Labs: No results found for: HGBA1C, MPG No results found for: PROLACTIN No results found for: CHOL, TRIG, HDL, CHOLHDL, VLDL, LDLCALC No results found for: TSH  Therapeutic Level Labs: No results found for: LITHIUM No results found for: VALPROATE No components found for:  CBMZ  Current Medications: Current Outpatient Medications  Medication Sig Dispense Refill  . Cholecalciferol (VITAMIN D3) 125 MCG (5000 UT) CAPS Take 5,000 capsules by mouth.    . clindamycin (CLEOCIN T) 1 % lotion APPLY A SMALL AMOUNT TO SKIN TWICE A DAY AS NEEDED    . FERREX 150 150 MG capsule Take by mouth daily.    Marland Kitchen FLUoxetine (PROZAC) 20 MG tablet TAKE 3 TABLETS  BY MOUTH EVERY DAY 270 tablet 1  . hydrochlorothiazide (HYDRODIURIL) 25 MG tablet Take 25 mg by mouth daily.    . hydroquinone 4 % cream APPLY A SMALL AMOUNT TOPICALLY EVERY DAY TO THE SKIN    . ibuprofen (ADVIL) 800 MG tablet Take 800 mg by mouth as needed.    . Multiple Vitamins-Minerals (HAIR SKIN AND NAILS FORMULA PO) Take as directed    . nabumetone (RELAFEN) 500 MG tablet Take 500 mg by mouth 2 (two) times daily as needed.    . naproxen (NAPROSYN) 500 MG tablet TAKE 1 TABLET BY MOUTH EVERY 12 HOURS AS NEEDED FOR PAIN 30 DAYS    . potassium chloride (KLOR-CON) 10 MEQ tablet Take 1 tablet (10 mEq total) by mouth daily. 90 tablet 4  . tamoxifen (NOLVADEX) 20 MG tablet Take 20 mg by mouth daily.    . vitamin B-12 (CYANOCOBALAMIN) 500 MCG tablet Take 500 mcg by mouth daily.     No current facility-administered medications for this visit.      Psychiatric Specialty Exam: Review of Systems  Constitutional: Positive for fatigue.  Psychiatric/Behavioral: Positive for decreased concentration and sleep disturbance. The patient is nervous/anxious.   All other systems reviewed and are negative.   There were no vitals taken for this visit.There is no height or weight on file to calculate BMI.  General Appearance: Casual and Fairly Groomed  Eye Contact:  Good  Speech:  Clear and Coherent and Normal Rate  Volume:  Normal  Mood:  Anxious and Depressed  Affect:  Constricted  Thought Process:  Goal Directed  Orientation:  Full (Time, Place, and Person)  Thought Content: Rumination   Suicidal Thoughts:  No  Homicidal Thoughts:  No  Memory:  Immediate;   Fair Recent;   Good Remote;   Good  Judgement:  Good  Insight:  Fair  Psychomotor Activity:  Normal  Concentration:  Concentration: Fair  Recall:  [place] of Knowledge: Good  Language: Good  Akathisia:  Negative  Handed:  Right  AIMS (if indicated): not done  Assets:  Communication Skills Desire for  Improvement Housing Resilience Social Support  ADL's:  Intact  Cognition: WNL  Sleep:  Fair   Screenings: GAD-7     Counselor from 05/11/2020 in [facility]  Total  GAD-7 Score 13    PHQ2-9     Counselor from 05/11/2020 in [facility]  PHQ-2 Total Score 2  PHQ-9 Total Score 12       Assessment and Plan: 54 yo MAAF with no past psychiatric history who has been referred for medication management of depression/anxiety after being seen by [name] LCSW. She reports approximately a year long problems with feeling depressed, irritable, excessively worried, tires, having difficulty falling asleep, problems with concentration and remembering things. She feels worthless, unsure of herself. She has no suicidal or homicidal thoughts, no hallucinations, delusions. She has no prior history of depression, no hx of mania. She does nt abuse alcohol or drugs. Sheworries about her health ever since she was diagnosed with breast cancerin July last year. She had a surgery in September 2020, went through radiation therapy and is currently on Tamoxifen. She was prescribed citalopram by her PCP up to 40 mg. She was also placed on disability given her ongoing fatigue, cognitive and emotional problems. She works as a Radiation protection practitioner for [employer]. Long term disability was denied she does not know the rationale for that decision. Rhiannon didnot see any clear improvement in her depressive symptoms with citalopram. She noted that since she has been on that medication she feels more restless (particularly noticeable in the evening when she goes to bed) and started to gain some weight. We have cross tapered it to fluoxetine which she tolerates much better. More anxious (execssive worrying, ruminating) than depressed at this time. She isnow on 60mg  of fluoxetineand is in [place] last week and plans to continue. She is  now using CPAP for OSA - sleep hasinitiallyimproved from 4 to 6 hours per night but recently she has been having more problems with falling and staying asleep. We have added aripiprazole for augmentation but her mood only minimally improved. Her panic attacks on the other hand resolved. Sheremainstoo anxious, depressed, and distractible to be able to adequately perform her job duties. She described having chronic problems with focusing, forgetfulness, task completion suggestive of ADD. She sleeps about 5 hours with zolpidem CR - better than before.   Dx: Major depressive disorder single episode moderate; GAD; Panic disorder;  Plan: We willdiscontinue 2 mg dose of aripiprazole, continue fluoxetine 60 mgdaily and lorazepam 1 mg prn panic attacks. I will try adding bupropion XL 150 mg to help with fatigue/concentration and we will try eszopiclone 2 mg instead of zolpidem for insomnia. She will continue counseling withCory Redmond Baseman.Next appointment with me in1 month. The plan was discussed with patient who had an opportunity to ask questions and these were all answered. I spend20 minutes invideoconferencingwith the patient.   Stephanie Acre, MD 09/22/2020, 10:56 AM

## 2020-09-23 ENCOUNTER — Ambulatory Visit (INDEPENDENT_AMBULATORY_CARE_PROVIDER_SITE_OTHER): Payer: No Typology Code available for payment source | Admitting: Licensed Clinical Social Worker

## 2020-09-23 ENCOUNTER — Other Ambulatory Visit: Payer: Self-pay

## 2020-09-23 DIAGNOSIS — F411 Generalized anxiety disorder: Secondary | ICD-10-CM | POA: Diagnosis not present

## 2020-09-23 DIAGNOSIS — F321 Major depressive disorder, single episode, moderate: Secondary | ICD-10-CM | POA: Diagnosis not present

## 2020-09-23 NOTE — Progress Notes (Signed)
Virtual Visit via Video Note   I connected with Kara [name] on 09/23/20 at 10:15am by video enabled telemedicine application and verified that I am speaking with the correct person using two identifiers.   I discussed the limitations, risks, security and privacy concerns of performing an evaluation and management service by video and the availability of in person appointments. I also discussed with the patient that there may be a patient responsible charge related to this service. The patient expressed understanding and agreed to proceed.   I discussed the assessment and treatment plan with the patient. The patient was provided an opportunity to ask questions and all were answered. The patient agreed with the plan and demonstrated an understanding of the instructions.    The patient was advised to call back or seek an in-person evaluation if the symptoms worsen or if the condition fails to improve as anticipated.   I provided 30 minutes of non-face-to-face time during this encounter.     [name], LCSW, LCAS ____________________ THERAPIST PROGRESS NOTE   Session Time: 10:15am - 10:45am  Location: Patient: Patient Home Provider: [facility]    Participation Level: Active   Behavioral Response: Alert, casually dressed, neatly groomed, depressed mood/affect   Type of Therapy:  Individual Therapy   Treatment Goals addressed: Anxiety/depression management; Medication management; Psychiatry followup; Exercise regimen and sleep hygiene    Interventions: CBT, problem solving/sleep hygiene    Summary: Kara [name] is a 54 year old married African American female that presented for virtual therapy today with diagnoses of Major Depressive Disorder, Single Episode, Moderate, and Generalized Anxiety Disorder.    Suicidal/Homicidal: None; without plan or intent.     Therapist Response: Clinician contacted Kara [name] by phone at 10:05am when she had not presented on time for appointment.   Kara [name] presented to virtual session 15 minutes late and apologized, reporting that she had overslept.  Clinician assessed for safety, sobriety, and medication compliance.  Kara [name] presented as alert, oriented x5, with no evidence or self-report of active SI/HI or A/V H.  Kara [name] reported ongoing compliance with medication and denied any abuse of alcohol or illicit substances.  Clinician inquired about Kara [name]'s emotional ratings today, as well as any significant changes in thoughts, feelings, or behavior since last check-in.  Kara [name] reported scores of 8/10 for depression, 8/10 for anxiety, and 0/10 for anger/irritability, but denied experiencing any panic attacks or outbursts.  Clinician inquired about Kara [name]'s successes and struggles over past 2 weeks.  Kara [name] reported that she has stayed busy working on school assignments, applying for jobs, and met with psychiatrist for appointment, where she was started on Bupropion to assist with both her depression and focus.  Kara [name] reported that she continues to struggle with low motivation and has been having trouble falling asleep at night, as she lays there but doesn't feel tired, which has led her to rely on sleeping medication to assist.  Clinician inquired about how much exercise Kara [name] has been getting lately, and benefits that adding this to routine might offer, such as improving mental and physical health, increasing energy and motivation, and helping to fall/stay asleep more effectively.  Kara [name] reported that she used to exercise x4 per week for 45 min to 1 hour each time on elliptical, but has found it difficult to push herself to get on it each day.  Clinician explored solutions with Kara [name] for modifying this routine in order to make exercise goal more realistic and achievable, such as decreasing amount of scheduled  work out days and/or length of time she would aim to work out for so it is less intimidating, as well as seeking assistance from someone in her support network to accompany  her to boost motivation.  Kara [name] reported that she does not think she needs to alter the frequency or length of time she works out for, but could benefit from having her husband accompany her via buddy system, since they walked together for several miles x2 last month, and she enjoyed this outing greatly.  Interventions were effective, as evidenced by Kara [name] reporting that this session was helpful for giving her ideas on ways to increase her physical activity in order to improve nightly rest, in addition to providing safe space to vent about some of her stressors without fear of judgement.  Clinician will continue to monitor.   Plan: Follow up again in 2 weeks.       Diagnosis: Major Depressive Disorder, Single Episode, Moderate, and Generalized Anxiety Disorder.   [name], LCSW, LCAS 09/23/20

## 2020-09-28 ENCOUNTER — Other Ambulatory Visit: Payer: Self-pay | Admitting: Oncology

## 2020-09-29 ENCOUNTER — Other Ambulatory Visit (HOSPITAL_COMMUNITY): Payer: Self-pay | Admitting: Psychiatry

## 2020-10-05 ENCOUNTER — Ambulatory Visit (HOSPITAL_COMMUNITY): Payer: No Typology Code available for payment source | Admitting: Licensed Clinical Social Worker

## 2020-10-05 ENCOUNTER — Telehealth (HOSPITAL_COMMUNITY): Payer: Self-pay | Admitting: Licensed Clinical Social Worker

## 2020-10-05 ENCOUNTER — Other Ambulatory Visit: Payer: Self-pay

## 2020-10-05 NOTE — Telephone Encounter (Signed)
Cena had a virtual appointment scheduled today at 10am.  When Ileanna connected to session with clinician, the video feed appeared unstable and audio was absent, so clinician contacted her by phone instead.  Shelbia answered phone call and spoke in a manner that was alert, oriented x5, with no evidence or self-report of SI/HI or A/V H.  Sheril reported that her reception was not good, and she was presently waiting to be called in for a doctor's appointment to be assessed for headaches that had plagued for her for several days now.  Clinician and Cedra agreed to cancel therapy session today so that she could prioritize medical appointment.  Janasia agreed to follow up later this week if she began to feel better.  Clinician will continue to monitor.    [name], [place], LCAS 10/05/20

## 2020-10-17 ENCOUNTER — Other Ambulatory Visit (HOSPITAL_COMMUNITY): Payer: Self-pay | Admitting: Psychiatry

## 2020-10-25 ENCOUNTER — Telehealth (HOSPITAL_COMMUNITY): Payer: Self-pay | Admitting: *Deleted

## 2020-10-25 NOTE — Telephone Encounter (Signed)
I will try but she has [facility] and she does not have ICD for insomnia and has only tried Ambien. I most definitely will submit it though.

## 2020-10-25 NOTE — Telephone Encounter (Signed)
She slept 5 hours on average with 12.5 ng of Ambien CR which is suboptimal. I think we should try to get Lunesta approved.

## 2020-10-25 NOTE — Telephone Encounter (Signed)
Received fax from [facility] requesting a PA on the Lunesta. FYI pt has an appointment tomorrow. Go forward with PA?

## 2020-10-26 ENCOUNTER — Telehealth (INDEPENDENT_AMBULATORY_CARE_PROVIDER_SITE_OTHER): Payer: Medicaid Other | Admitting: Psychiatry

## 2020-10-26 ENCOUNTER — Other Ambulatory Visit: Payer: Self-pay

## 2020-10-26 DIAGNOSIS — F410 Panic disorder [episodic paroxysmal anxiety] without agoraphobia: Secondary | ICD-10-CM | POA: Diagnosis not present

## 2020-10-26 DIAGNOSIS — F411 Generalized anxiety disorder: Secondary | ICD-10-CM

## 2020-10-26 DIAGNOSIS — F5101 Primary insomnia: Secondary | ICD-10-CM

## 2020-10-26 DIAGNOSIS — F909 Attention-deficit hyperactivity disorder, unspecified type: Secondary | ICD-10-CM

## 2020-10-26 DIAGNOSIS — F329 Major depressive disorder, single episode, unspecified: Secondary | ICD-10-CM

## 2020-10-26 MED ORDER — TRAZODONE HCL 100 MG PO TABS: 100.0000 mg | ORAL_TABLET | Freq: Every evening | ORAL | 0 refills | Status: DC | PRN

## 2020-10-26 NOTE — Progress Notes (Signed)
BH MD/PA/NP OP Progress Note  10/26/2020 10:44 AM Kara [name]  MRN:  [id number] Interview was conducted using videoconferencing application and I verified that I was speaking with the correct person using two identifiers. I discussed the limitations of evaluation and management by telemedicine and  the availability of in person appointments. Patient expressed understanding and agreed to proceed. Participants in the visit: patient (location - home); physician (location - home office).  Chief Complaint: Insomnia, anxiety, depression.  HPI: 55 yo MAAF with no past psychiatric history who has been referred for medication management of depression/anxiety after being seen by [name] LCSW. She reports approximately a year long problems with feeling depressed, irritable, excessively worried, tires, having difficulty falling asleep, problems with concentration and remembering things. She feels worthless, unsure of herself. She has no suicidal or homicidal thoughts, no hallucinations, delusions. She has no prior history of depression, no hx of mania. She does nt abuse alcohol or drugs. Sheworries about her health ever since she was diagnosed with breast cancerin July last year. She had a surgery in September 2020, went through radiation therapy and is currently on Tamoxifen. She was prescribed citalopram by her PCP up to 40 mg. She was also placed on disability given her ongoing fatigue, cognitive and emotional problems. She works as a Radiation protection practitioner for [employer]. Long term disability was deniedshe does not know the rationale for that decision. Rosamae didnot see any clear improvement in her depressive symptoms with citalopram. She noted that since she has been on that medication she feels more restless (particularly noticeable in the evening when she goes to bed) and started to gain some weight. We have cross tapered it to fluoxetine which she tolerates much better. More anxious  (execssive worrying, ruminating) than depressed at this time. She isnow on 60mg  of fluoxetineand is in [place] last week and plans to continue. She is now using CPAP for OSA - sleep hasinitiallyimproved from 4 to 6 hours per night but recently she has been having more problems with falling and staying asleep. We have added aripiprazole for augmentation but her mood only minimally improved. Her panic attacks on the other hand resolved.She described having chronic problems with focusing, forgetfulness, task completion suggestive of ADD. We have added bupropion to help with fatigue/concentration and it seems to have some beneficial effect for mood as well. Shecontinues to report problems with initiation and maintaining sleep - with zolpidem CR 12.5 mg was sleeping less than 5 hours.     Visit Diagnosis:    ICD-10-CM   1. Primary insomnia  F51.01   2. Panic disorder  F41.0   3. GAD (generalized anxiety disorder)  F41.1   4. Adult ADHD  F90.9   5. Major depressive disorder, single episode with anxious distress  F32.9     Past Psychiatric History: Please see intake H&P>  Past Medical History:  Past Medical History:  Diagnosis Date  . Abnormal uterine bleeding (AUB)   . 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 Carpal tunnel syndrome   . Depression   . Family history of breast cancer   . HA (headache)   . Hypertension   . Migraines   . OSA (obstructive sleep apnea)    Mild OSA AHI 9.3/hr now on CPAP at 6CM H2O  . Personal history of radiation therapy   . Sleep apnea    not used CPAP in over a month    Past Surgical History:  Procedure  Laterality Date  . ABLATION    . BREAST LUMPECTOMY Left 07/08/2019  . BREAST LUMPECTOMY WITH RADIOACTIVE SEED LOCALIZATION Left 07/08/2019   Procedure: LEFT BREAST LUMPECTOMY X 3  WITH RADIOACTIVE SEED LOCALIZATION;  Surgeon: Erroll Luna, MD;  Location: [facility];  Service: General;  Laterality: Left;  .  BREAST RECONSTRUCTION Left 07/15/2019   Procedure: LEFT BREAST ONCOPLASTIC RECONSTRUCTION;  Surgeon: Irene Limbo, MD;  Location: [facility];  Service: Plastics;  Laterality: Left;  . CESAREAN SECTION     x2  . DILATATION & CURETTAGE/HYSTEROSCOPY WITH MYOSURE N/A 06/04/2019   Procedure: DILATATION /HYSTEROSCOPY;  Surgeon: Janyth Pupa, DO;  Location: [facility];  Service: Gynecology;  Laterality: N/A;  . LAPAROSCOPY N/A 06/04/2019   Procedure: LAPAROSCOPY Diagnostic, Repair Uterine Perforation;  Surgeon: Janyth Pupa, DO;  Location: [facility];  Service: Gynecology;  Laterality: N/A;  . MASTOPEXY Right 07/15/2019   Procedure: RIGHT BREAST MASTOPEXY;  Surgeon: Irene Limbo, MD;  Location: [facility];  Service: Plastics;  Laterality: Right;  . tummy tuck      Family Psychiatric History: None.  Family History:  Family History  Problem Relation Age of Onset  . Hypertension Mother   . Breast cancer Sister 57  . Breast cancer Maternal Aunt        dx 50s-60s  . Lung cancer Maternal Uncle   . Breast cancer Other        MGM's sister  . Other Sister 4       MVA  . Breast cancer Other        Mother's maternal cousins - x3    Social History:  Social History   Socioeconomic History  . Marital status: Married    Spouse name: [name]  . Number of children: 2  . Years of education: college  . Highest education level: Not on file  Occupational History    Comment: [employer]  Tobacco Use  . Smoking status: Never Smoker  . Smokeless tobacco: Never Used  Vaping Use  . Vaping Use: Never used  Substance and Sexual Activity  . Alcohol use: Yes    Alcohol/week: 0.0 standard drinks    Comment: social  . Drug use: No  . Sexual activity: Yes    Birth control/protection: Surgical    Comment: ablation  Other Topics Concern  . Not on file  Social History Narrative   Patient lives at home with her husband and two children.    Patient works full time for [employer].stopped in 2015, then at [employer].   Education college.   Right handed.   Caffeine two cup daily coffee.   Social Determinants of Health   Financial Resource Strain: Not on file  Food Insecurity: Not on file  Transportation Needs: Not on file  Physical Activity: Not on file  Stress: Not on file  Social Connections: Not on file    Allergies:  Allergies  Allergen Reactions  . Imitrex [Sumatriptan] Other (See Comments)    Nauseated - Feel like passing out - lack of therapeutic effect.  . Hydrocil [Psyllium] Nausea And Vomiting  . Other     "some kind of pain medicine a long time ago made me feel nauseous"     Metabolic Disorder Labs: No results found for: HGBA1C, MPG No results found for: PROLACTIN No results found for: CHOL, TRIG, HDL, CHOLHDL, VLDL, LDLCALC No results found for: TSH  Therapeutic Level Labs: No results found for: LITHIUM No  results found for: VALPROATE No components found for:  CBMZ  Current Medications: Current Outpatient Medications  Medication Sig Dispense Refill  . traZODone (DESYREL) 100 MG tablet Take 1 tablet (100 mg total) by mouth at bedtime as needed for sleep. 30 tablet 0  . buPROPion (WELLBUTRIN XL) 150 MG 24 hr tablet TAKE 1 TABLET BY MOUTH EVERY DAY 90 tablet 1  . Cholecalciferol (VITAMIN D3) 125 MCG (5000 UT) CAPS Take 5,000 capsules by mouth.    . clindamycin (CLEOCIN T) 1 % lotion APPLY A SMALL AMOUNT TO SKIN TWICE A DAY AS NEEDED    . eszopiclone (LUNESTA) 2 MG TABS tablet Take 1 tablet (2 mg total) by mouth at bedtime as needed for sleep. Take immediately before bedtime 30 tablet 0  . FERREX 150 150 MG capsule Take by mouth daily.    Marland Kitchen FLUoxetine (PROZAC) 20 MG tablet TAKE 3 TABLETS BY MOUTH EVERY DAY 270 tablet 1  . hydrochlorothiazide (HYDRODIURIL) 25 MG tablet Take 25 mg by mouth daily.    . hydroquinone 4 % cream APPLY A SMALL AMOUNT TOPICALLY EVERY DAY TO THE SKIN    . ibuprofen (ADVIL) 800 MG  tablet Take 800 mg by mouth as needed.    Marland Kitchen LORazepam (ATIVAN) 1 MG tablet Take 1 tablet (1 mg total) by mouth daily as needed for anxiety. 30 tablet 1  . Multiple Vitamins-Minerals (HAIR SKIN AND NAILS FORMULA PO) Take as directed    . nabumetone (RELAFEN) 500 MG tablet Take 500 mg by mouth 2 (two) times daily as needed.    . naproxen (NAPROSYN) 500 MG tablet TAKE 1 TABLET BY MOUTH EVERY 12 HOURS AS NEEDED FOR PAIN 30 DAYS    . potassium chloride (KLOR-CON) 10 MEQ tablet TAKE 1 TABLET BY MOUTH EVERY DAY 90 tablet 4  . tamoxifen (NOLVADEX) 20 MG tablet TAKE 1 TABLET BY MOUTH EVERY DAY 90 tablet 4  . vitamin B-12 (CYANOCOBALAMIN) 500 MCG tablet Take 500 mcg by mouth daily.     No current facility-administered medications for this visit.    Psychiatric Specialty Exam: Review of Systems  Constitutional: Positive for fatigue.  Psychiatric/Behavioral: Positive for decreased concentration and sleep disturbance. The patient is nervous/anxious.   All other systems reviewed and are negative.   There were no vitals taken for this visit.There is no height or weight on file to calculate BMI.  General Appearance: Casual and Well Groomed  Eye Contact:  Good  Speech:  Clear and Coherent and Normal Rate  Volume:  Normal  Mood:  Anxious and Depressed  Affect:  Full Range  Thought Process:  Goal Directed  Orientation:  Full (Time, Place, and Person)  Thought Content: Logical   Suicidal Thoughts:  No  Homicidal Thoughts:  No  Memory:  Immediate;   Good Recent;   Good Remote;   Good  Judgement:  Good  Insight:  Good  Psychomotor Activity:  Normal  Concentration:  Concentration: Fair  Recall:  Good  Fund of Knowledge: Good  Language: Good  Akathisia:  Negative  Handed:  Right  AIMS (if indicated): not done  Assets:  Communication Skills Desire for Improvement Financial Resources/Insurance Housing Social Support Talents/Skills  ADL's:  Intact  Cognition: WNL  Sleep:  Poor    Screenings: GAD-7   Flowsheet Row Counselor from 05/11/2020 in [facility]  Total GAD-7 Score 13    PHQ2-9   Flowsheet Row Counselor from 05/11/2020 in [facility]  PHQ-2  Total Score 2  PHQ-9 Total Score 12       Assessment and Plan: 55 yo MAAF with no past psychiatric history who has been referred for medication management of depression/anxiety after being seen by [name] LCSW. She reports approximately a year long problems with feeling depressed, irritable, excessively worried, tires, having difficulty falling asleep, problems with concentration and remembering things. She feels worthless, unsure of herself. She has no suicidal or homicidal thoughts, no hallucinations, delusions. She has no prior history of depression, no hx of mania. She does nt abuse alcohol or drugs. Sheworries about her health ever since she was diagnosed with breast cancerin July last year. She had a surgery in September 2020, went through radiation therapy and is currently on Tamoxifen. She was prescribed citalopram by her PCP up to 40 mg. She was also placed on disability given her ongoing fatigue, cognitive and emotional problems. She works as a Radiation protection practitioner for [employer]. Long term disability was deniedshe does not know the rationale for that decision. Aleeta didnot see any clear improvement in her depressive symptoms with citalopram. She noted that since she has been on that medication she feels more restless (particularly noticeable in the evening when she goes to bed) and started to gain some weight. We have cross tapered it to fluoxetine which she tolerates much better. More anxious (execssive worrying, ruminating) than depressed at this time. She isnow on 60mg  of fluoxetineand is in [place] last week and plans to continue. She is now using CPAP for OSA - sleep hasinitiallyimproved from 4 to 6 hours per  night but recently she has been having more problems with falling and staying asleep. We have added aripiprazole for augmentation but her mood only minimally improved. Her panic attacks on the other hand resolved.She described having chronic problems with focusing, forgetfulness, task completion suggestive of ADD. We have added bupropion to help with fatigue/concentration and it seems to have some beneficial effect for mood as well. Shecontinues to report problems with initiation and maintaining sleep - with zolpidem CR 12.5 mg was sleeping less than 5 hours.   Dx: Major depressive disorder single episode mild; GAD;Panic disorder; Insomnia; susp. Adult ADD  Plan: We willcontinuefluoxetine60mg daily, bupropion XL 150 mg in amand lorazepam 1 mg prn panic attacks.I will try adding trazodone 100 mg for insomnia - if it is ineffective we can try temazepam next. She will continue counseling withCory Redmond Baseman.Next appointment with me in1 month. The plan was discussed with patient who had an opportunity to ask questions and these were all answered. I spend24minutes invideoconferencingwith the patient.   Stephanie Acre, MD 10/26/2020, 10:44 AM

## 2020-11-25 ENCOUNTER — Other Ambulatory Visit (HOSPITAL_COMMUNITY): Payer: Self-pay | Admitting: Psychiatry

## 2020-11-29 ENCOUNTER — Other Ambulatory Visit: Payer: Self-pay

## 2020-11-29 ENCOUNTER — Telehealth (HOSPITAL_COMMUNITY): Payer: Medicaid Other | Admitting: Psychiatry

## 2020-12-03 ENCOUNTER — Telehealth: Payer: Self-pay | Admitting: Oncology

## 2020-12-03 NOTE — Telephone Encounter (Signed)
Called pt per 2/18 sch msg - no answer. Left message for patient to call back to reschedule appt.

## 2020-12-06 ENCOUNTER — Inpatient Hospital Stay: Payer: Medicaid Other | Attending: Oncology | Admitting: Oncology

## 2020-12-06 ENCOUNTER — Inpatient Hospital Stay: Payer: Medicaid Other

## 2020-12-06 ENCOUNTER — Other Ambulatory Visit: Payer: Self-pay

## 2020-12-06 VITALS — BP 133/62 | HR 59 | Temp 98.2°F | Resp 20 | Ht 64.5 in | Wt 187.9 lb

## 2020-12-06 DIAGNOSIS — Z170 Estrogen receptor positive status [ER+]: Secondary | ICD-10-CM

## 2020-12-06 DIAGNOSIS — Z1501 Genetic susceptibility to malignant neoplasm of breast: Secondary | ICD-10-CM | POA: Insufficient documentation

## 2020-12-06 DIAGNOSIS — Z886 Allergy status to analgesic agent status: Secondary | ICD-10-CM | POA: Insufficient documentation

## 2020-12-06 DIAGNOSIS — G4733 Obstructive sleep apnea (adult) (pediatric): Secondary | ICD-10-CM | POA: Diagnosis not present

## 2020-12-06 DIAGNOSIS — C50312 Malignant neoplasm of lower-inner quadrant of left female breast: Secondary | ICD-10-CM

## 2020-12-06 DIAGNOSIS — F32A Depression, unspecified: Secondary | ICD-10-CM | POA: Diagnosis not present

## 2020-12-06 DIAGNOSIS — Z8249 Family history of ischemic heart disease and other diseases of the circulatory system: Secondary | ICD-10-CM | POA: Diagnosis not present

## 2020-12-06 DIAGNOSIS — I10 Essential (primary) hypertension: Secondary | ICD-10-CM | POA: Diagnosis not present

## 2020-12-06 DIAGNOSIS — Z801 Family history of malignant neoplasm of trachea, bronchus and lung: Secondary | ICD-10-CM | POA: Diagnosis not present

## 2020-12-06 DIAGNOSIS — Z923 Personal history of irradiation: Secondary | ICD-10-CM | POA: Diagnosis not present

## 2020-12-06 DIAGNOSIS — Z803 Family history of malignant neoplasm of breast: Secondary | ICD-10-CM | POA: Insufficient documentation

## 2020-12-06 DIAGNOSIS — Z79899 Other long term (current) drug therapy: Secondary | ICD-10-CM | POA: Diagnosis not present

## 2020-12-06 DIAGNOSIS — R232 Flushing: Secondary | ICD-10-CM | POA: Insufficient documentation

## 2020-12-06 LAB — CBC WITH DIFFERENTIAL (CANCER CENTER ONLY)
Abs Immature Granulocytes: 0.01 10*3/uL (ref 0.00–0.07)
Basophils Absolute: 0 10*3/uL (ref 0.0–0.1)
Basophils Relative: 0 %
Eosinophils Absolute: 0 10*3/uL (ref 0.0–0.5)
Eosinophils Relative: 1 %
HCT: 34.6 % — ABNORMAL LOW (ref 36.0–46.0)
Hemoglobin: 11.5 g/dL — ABNORMAL LOW (ref 12.0–15.0)
Immature Granulocytes: 0 %
Lymphocytes Relative: 24 %
Lymphs Abs: 0.9 10*3/uL (ref 0.7–4.0)
MCH: 31.2 pg (ref 26.0–34.0)
MCHC: 33.2 g/dL (ref 30.0–36.0)
MCV: 93.8 fL (ref 80.0–100.0)
Monocytes Absolute: 0.2 10*3/uL (ref 0.1–1.0)
Monocytes Relative: 6 %
Neutro Abs: 2.6 10*3/uL (ref 1.7–7.7)
Neutrophils Relative %: 69 %
Platelet Count: 314 10*3/uL (ref 150–400)
RBC: 3.69 MIL/uL — ABNORMAL LOW (ref 3.87–5.11)
RDW: 13.7 % (ref 11.5–15.5)
WBC Count: 3.7 10*3/uL — ABNORMAL LOW (ref 4.0–10.5)
nRBC: 0 % (ref 0.0–0.2)

## 2020-12-06 LAB — BASIC METABOLIC PANEL - CANCER CENTER ONLY
Anion gap: 9 (ref 5–15)
BUN: 12 mg/dL (ref 6–20)
CO2: 27 mmol/L (ref 22–32)
Calcium: 8.9 mg/dL (ref 8.9–10.3)
Chloride: 103 mmol/L (ref 98–111)
Creatinine: 1.01 mg/dL — ABNORMAL HIGH (ref 0.44–1.00)
GFR, Estimated: >60 mL/min (ref >60)
Glucose, Bld: 132 mg/dL — ABNORMAL HIGH (ref 70–99)
Potassium: 3.6 mmol/L (ref 3.5–5.1)
Sodium: 139 mmol/L (ref 135–145)

## 2020-12-06 MED ORDER — TAMOXIFEN CITRATE 20 MG PO TABS: 20.0000 mg | ORAL_TABLET | Freq: Every day | ORAL | 4 refills | Status: DC

## 2020-12-06 NOTE — Progress Notes (Signed)
[place]  [phone] [phone]     ID: Kara [name] DOB: 16-Feb-1966  MR#: [id number]  ONG#:[id number]  Patient Care Team: Aretta Nip, MD as PCP - General (Family Medicine) Sueanne Margarita, MD as Consulting Physician (Cardiology) Irene Limbo, MD as Consulting Physician (Plastic Surgery) Erroll Luna, MD as Consulting Physician (General Surgery) Andrea Ferrer, Virgie Dad, MD as Consulting Physician (Oncology) Kyung Rudd, MD as Consulting Physician (Radiation Oncology) Janyth Pupa, DO as Consulting Physician (Obstetrics and Gynecology) Chauncey Cruel, MD OTHER MD:  CHIEF COMPLAINT: Estrogen receptor positive ductal carcinoma in situ  CURRENT TREATMENT: tamoxifen   INTERVAL HISTORY: Kara [name] returns today for follow up of her noninvasive breast cancer.   She is taking Tamoxifen daily. She has hot flashes which however have improved.  She has a little bit of vaginal wetness.  Kara [name]'s most recent mammogram was completed on 04/20/2020 and demonstrated no evidence of malignancy and breast density category C.    Her most recent bone density testing was completed on 08/08/2019 and was normal.     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  She and her husband are back together.  The main problem she has is feeling tired.  She does think some of this may be due to depression and she is on medication for that.  She has an elliptical at home which he uses.  Detailed review of systems today was otherwise noncontributory   COVID 19 VACCINATION STATUS: Status post Pfizer x2 with booster November 2021   HISTORY OF CURRENT ILLNESS: From the original intake note:  Kara [name] had routine screening mammography on 04/11/2019 showing a possible abnormality in the left breast. She  underwent bilateral diagnostic mammography with tomography at [facility] on 04/17/2019 showing: breast density category C; indeterminate 4 cm calcifications in the lower-inner quadrant of the left breast.   Accordingly on 04/23/2019 she proceeded to biopsy of the left breast area in question. The pathology from this procedure ([id number]) showed: ductal carcinoma in situ with calcifications, low grade; fibrocystic and fibroadenomatoid change.  Prognostic indicators significant for: estrogen receptor, 90% positive with strong staining intensity and progesterone receptor, 60% positive with moderate staining intensity.   The patient's subsequent history is as detailed below.   PAST MEDICAL HISTORY: Past Medical History:  Diagnosis Date  . Abnormal uterine bleeding (AUB)   . Cancer ([place]) 04/2019   left breast DCIS  . Carpal tunnel syndrome   . Depression   . Family history of breast cancer   . HA (headache)   . Hypertension   . Migraines   . OSA (obstructive sleep apnea)    Mild OSA AHI 9.3/hr now on CPAP at 6CM H2O  . Personal history of radiation therapy   . Sleep apnea    not used CPAP in over a month    PAST SURGICAL HISTORY: Past Surgical History:  Procedure Laterality Date  . ABLATION    . BREAST LUMPECTOMY Left 07/08/2019  . BREAST LUMPECTOMY WITH RADIOACTIVE SEED LOCALIZATION Left 07/08/2019   Procedure: LEFT BREAST LUMPECTOMY X 3  WITH RADIOACTIVE SEED LOCALIZATION;  Surgeon: Erroll Luna, MD;  Location: [place];  Service: General;  Laterality: Left;  . BREAST RECONSTRUCTION Left 07/15/2019   Procedure: LEFT BREAST ONCOPLASTIC RECONSTRUCTION;  Surgeon: Irene Limbo, MD;  Location: [facility];  Service: Plastics;  Laterality: Left;  . CESAREAN SECTION     x2  . DILATATION & CURETTAGE/HYSTEROSCOPY WITH MYOSURE N/A 06/04/2019   Procedure: DILATATION /HYSTEROSCOPY;  Surgeon: Janyth Pupa, DO;  Location: [facility];   Service: Gynecology;  Laterality: N/A;  . LAPAROSCOPY N/A 06/04/2019   Procedure: LAPAROSCOPY Diagnostic, Repair Uterine Perforation;  Surgeon: Janyth Pupa, DO;  Location: [facility];  Service: Gynecology;  Laterality: N/A;  . MASTOPEXY Right 07/15/2019   Procedure: RIGHT BREAST MASTOPEXY;  Surgeon: Irene Limbo, MD;  Location: [facility];  Service: Plastics;  Laterality: Right;  . tummy tuck    right ankle surgery as a kid   FAMILY HISTORY: Family History  Problem Relation Age of Onset  . Hypertension Mother   . Breast cancer Sister 4  . Breast cancer Maternal Aunt        dx 50s-60s  . Lung cancer Maternal Uncle   . Breast cancer Other        MGM's sister  . Other Sister 10       MVA  . Breast cancer Other        Mother's maternal cousins - x3  Patient's father is living at age 63-80. Patient's mother is also living at age 47. (as of 05/2019) The patient's maternal half-sister was diagnosed with breast cancer at age 29. Another half-sister had breast cancer before the age of 74 and has since passed away.  The patient has 2 half siblings on her mother's [name] and 3 half siblings on her father's. The patient denies a family hx of ovarian, prostate or pancreatic cancer.   GYNECOLOGIC HISTORY:  No LMP recorded. Patient has had an ablation. Menarche: 55 years old Age at first live birth: 55 years old Kara [name] 2 LMP she is currently spotting Contraceptive: used for ~25 years with no problems HRT n/a  Hysterectomy? no BSO? 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  Also she is in school for a PhD in Proofreader.. She already has a degree in chemistry. Husband Kara [name] is a Barrister's clerk.  She lives at home with her husband and their youngest son Kara [name], age 61, a Ship broker.  Older son Kara [name], , lives in [place] as a Ship broker of [employer] in [employer].  The patient attends [employer].    ADVANCED DIRECTIVES: Kara [name] is comfortable with her husband Kara [name] as her HCPOA   HEALTH MAINTENANCE: Social History   Tobacco Use  . Smoking status: Never Smoker  . Smokeless tobacco: Never Used  Vaping Use  . Vaping Use: Never used  Substance Use Topics  . Alcohol use: Yes    Alcohol/week: 0.0 standard drinks    Comment: social  . Drug use: No     Colonoscopy:   PAP: Due  Bone density: never done   Allergies  Allergen Reactions  . Imitrex [Sumatriptan] Other (See Comments)    Nauseated - Feel like passing out - lack of therapeutic effect.  . Hydrocil [Psyllium] Nausea And Vomiting  . Other     "some kind of pain medicine a long time ago made me feel nauseous"     Current Outpatient Medications  Medication Sig Dispense Refill  . buPROPion (WELLBUTRIN XL) 150 MG 24 hr tablet TAKE 1 TABLET BY MOUTH EVERY DAY 90 tablet 1  .  Cholecalciferol (VITAMIN D3) 125 MCG (5000 UT) CAPS Take 5,000 capsules by mouth.    . clindamycin (CLEOCIN T) 1 % lotion APPLY A SMALL AMOUNT TO SKIN TWICE A DAY AS NEEDED    . eszopiclone (LUNESTA) 2 MG TABS tablet Take 1 tablet (2 mg total) by mouth at bedtime as needed for sleep. Take immediately before bedtime 30 tablet 0  . FERREX 150 150 MG capsule Take by mouth daily.    Marland Kitchen FLUoxetine (PROZAC) 20 MG tablet TAKE 3 TABLETS BY MOUTH EVERY DAY 270 tablet 1  . hydrochlorothiazide (HYDRODIURIL) 25 MG tablet Take 25 mg by mouth daily.    . hydroquinone 4 % cream APPLY A SMALL AMOUNT TOPICALLY EVERY DAY TO THE SKIN    . ibuprofen (ADVIL) 800 MG tablet Take 800 mg by mouth as needed.    . Multiple Vitamins-Minerals (HAIR SKIN AND NAILS FORMULA PO) Take as directed    . nabumetone (RELAFEN) 500 MG tablet Take 500 mg by mouth 2 (two) times daily as needed.    . naproxen (NAPROSYN) 500 MG tablet TAKE 1 TABLET BY MOUTH EVERY 12 HOURS AS NEEDED FOR PAIN 30 DAYS    . potassium chloride (KLOR-CON) 10 MEQ tablet TAKE  1 TABLET BY MOUTH EVERY DAY 90 tablet 4  . tamoxifen (NOLVADEX) 20 MG tablet TAKE 1 TABLET BY MOUTH EVERY DAY 90 tablet 4  . traZODone (DESYREL) 100 MG tablet TAKE 1 TABLET BY MOUTH AT BEDTIME AS NEEDED FOR SLEEP. 90 tablet 1  . vitamin B-12 (CYANOCOBALAMIN) 500 MCG tablet Take 500 mcg by mouth daily.     No current facility-administered medications for this visit.    OBJECTIVE: African-American [name] who appears stated age  23:   12/06/20 1554  BP: 133/62  Pulse: (!) 59  Resp: 20  Temp: 98.2 F (36.8 C)  SpO2: 100%     Body mass index is 31.75 kg/m.   Wt Readings from Last 3 Encounters:  12/06/20 187 lb 14.4 oz (85.2 kg)  06/02/20 178 lb 4.8 oz (80.9 kg)  05/13/20 182 lb (82.6 kg)      ECOG FS:1 - Symptomatic but completely ambulatory  Sclerae unicteric, EOMs intact Wearing a mask No cervical or supraclavicular adenopathy Lungs no rales or rhonchi Heart regular rate and rhythm Abd soft, nontender, positive bowel sounds MSK no focal spinal tenderness, no upper extremity lymphedema Neuro: nonfocal, well oriented, appropriate affect Breasts: The right breast is status post reduction.  The left breast is status post lumpectomy and radiation.  There is no evidence of local recurrence.  Both axillae are benign   LAB RESULTS:  CMP     Component Value Date/Time   NA 139 12/06/2020 1502   NA 138 11/02/2014 0932   K 3.6 12/06/2020 1502   CL 103 12/06/2020 1502   CO2 27 12/06/2020 1502   GLUCOSE 132 (H) 12/06/2020 1502   BUN 12 12/06/2020 1502   BUN 16 11/02/2014 0932   CREATININE 1.01 (H) 12/06/2020 1502   CALCIUM 8.9 12/06/2020 1502   PROT 7.5 12/23/2019 1153   ALBUMIN 3.7 12/23/2019 1153   AST 14 (L) 12/23/2019 1153   ALT 9 12/23/2019 1153   ALKPHOS 47 12/23/2019 1153   BILITOT 0.3 12/23/2019 1153   GFRNONAA >60 12/06/2020 1502   GFRAA >60 12/23/2019 1153   Lab Results  Component Value Date   WBC 3.7 (L) 12/06/2020   NEUTROABS 2.6 12/06/2020   HGB 11.5  (L) 12/06/2020   HCT 34.6 (  L) 12/06/2020   MCV 93.8 12/06/2020   PLT 314 12/06/2020    No results found for: LABCA2  No components found for: [id number]  No results for input(s): INR in the last 168 hours.  No results found for: LABCA2  No results found for: FHL456  No results found for: YBW389  No results found for: HTD428  No results found for: CA2729  No components found for: HGQUANT  No results found for: CEA1 / No results found for: CEA1   No results found for: AFPTUMOR  No results found for: CHROMOGRNA  No results found for: TOTALPROTELP, ALBUMINELP, A1GS, A2GS, BETS, BETA2SER, GAMS, MSPIKE, SPEI (this displays SPEP labs)  No results found for: KPAFRELGTCHN, LAMBDASER, KAPLAMBRATIO (kappa/lambda light chains)  No results found for: HGBA, HGBA2QUANT, HGBFQUANT, HGBSQUAN (Hemoglobinopathy evaluation)   No results found for: LDH  No results found for: IRON, TIBC, IRONPCTSAT (Iron and TIBC)  No results found for: FERRITIN  Urinalysis No results found for: COLORURINE, APPEARANCEUR, LABSPEC, PHURINE, GLUCOSEU, HGBUR, BILIRUBINUR, KETONESUR, PROTEINUR, UROBILINOGEN, NITRITE, LEUKOCYTESUR   STUDIES: No results found.  ELIGIBLE FOR AVAILABLE RESEARCH PROTOCOL: opted against COMET  ASSESSMENT: 55 y.o.  Kara [name] status post left breast biopsy 04/23/2023 ductal carcinoma in situ, low-grade, estrogen and progesterone receptor positive  (1) s/[name] left lumpectomy on 07/08/2019 showing 0.2cm DCIS, grade 1, margins negative  (a) s/[name] bilateral mammoplasty on 07/15/2019  (2) adjuvant radiation started 08/07/2019  (3) tamoxifen started December 2020  (a) on 08/07/2019 her LH was 35.6 and FSH 46.3, with estradiol 37.5  (4) genetics testing 05/30/2019 through the [facility] STAT Panel + Common Hereditary Cancers Panel found no deleterious mutations in ATM, BRCA1, BRCA2, CDH1, CHEK2, PALB2, PTEN, STK11 and TP53. or in APC, ATM, AXIN2, BARD1, BMPR1A, BRCA1, BRCA2,  BRIP1, CDH1, CDKN2A (p14ARF), CDKN2A (p16INK4a), CKD4, CHEK2, CTNNA1, DICER1, EPCAM (Deletion/duplication testing only), GREM1 (promoter region deletion/duplication testing only), KIT, MEN1, MLH1, MSH2, MSH3, MSH6, MUTYH, NBN, NF1, NHTL1, PALB2, PDGFRA, PMS2, POLD1, POLE, PTEN, RAD50, RAD51C, RAD51D, RNF43, SDHB, SDHC, SDHD, SMAD4, SMARCA4. STK11, TP53, TSC1, TSC2, and VHL.  The following genes were evaluated for sequence changes only: SDHA and HOXB13 c.251G>A variant only.    PLAN: Marlise is now about a year and a half out from definitive surgery with no evidence of disease recurrence.  This is very favorable.  She is tolerating tamoxifen well and the plan will be to continue that a total of 5 years.  She will have her mammography in July of this year.  She will see me again in August.  From that point we will start seeing her on a once a year basis  Total encounter time 20 minutes.Sarajane Jews C. Antoinett Dorman, MD 12/06/20 4:09 PM Medical Oncology and Hematology [facility] [place], [place] [address] Tel. [phone]    Fax. [phone]   I, Wilburn Mylar, am acting as scribe for Dr. Virgie Dad. Kaitelyn Jamison.  I, Lurline Del MD, have reviewed the above documentation for accuracy and completeness, and I agree with the above.    *Total Encounter Time as defined by the Centers for Medicare and Medicaid Services includes, in addition to the face-to-face time of a patient visit (documented in the note above) non-face-to-face time: obtaining and reviewing outside history, ordering and reviewing medications, tests or procedures, care coordination (communications with other health care professionals or caregivers) and documentation in the medical record.

## 2020-12-08 ENCOUNTER — Telehealth: Payer: Self-pay | Admitting: Oncology

## 2020-12-08 NOTE — Telephone Encounter (Signed)
Scheduled appts per 2/21 los. Left voicemail with appt date/time.

## 2020-12-21 DIAGNOSIS — G4733 Obstructive sleep apnea (adult) (pediatric): Secondary | ICD-10-CM | POA: Diagnosis not present

## 2020-12-30 ENCOUNTER — Telehealth (INDEPENDENT_AMBULATORY_CARE_PROVIDER_SITE_OTHER): Payer: BC Managed Care – PPO | Admitting: Psychiatry

## 2020-12-30 ENCOUNTER — Telehealth (HOSPITAL_COMMUNITY): Payer: Self-pay | Admitting: *Deleted

## 2020-12-30 ENCOUNTER — Other Ambulatory Visit: Payer: Self-pay

## 2020-12-30 DIAGNOSIS — F909 Attention-deficit hyperactivity disorder, unspecified type: Secondary | ICD-10-CM

## 2020-12-30 DIAGNOSIS — F329 Major depressive disorder, single episode, unspecified: Secondary | ICD-10-CM

## 2020-12-30 DIAGNOSIS — F411 Generalized anxiety disorder: Secondary | ICD-10-CM

## 2020-12-30 MED ORDER — FLUOXETINE HCL 20 MG PO TABS: 60.0000 mg | ORAL_TABLET | Freq: Every day | ORAL | 1 refills | Status: DC

## 2020-12-30 MED ORDER — AMPHETAMINE-DEXTROAMPHET ER 20 MG PO CP24: 20.0000 mg | ORAL_CAPSULE | Freq: Every day | ORAL | 0 refills | Status: DC

## 2020-12-30 MED ORDER — AMPHETAMINE-DEXTROAMPHET ER 20 MG PO CP24: 20.0000 mg | ORAL_CAPSULE | Freq: Every day | ORAL | 0 refills | Status: AC

## 2020-12-30 NOTE — Telephone Encounter (Signed)
PA for Adderall XR 20mg  has been approved from 12/30/20 through 12/30/21. PA was submitted via CoverMyMeds this morning.Marland Kitchen

## 2020-12-30 NOTE — Progress Notes (Signed)
BH MD/PA/NP OP Progress Note  12/30/2020 8:35 AM Kara [name]  MRN:  [id number] Interview was conducted by phone and I verified that I was speaking with the correct person using two identifiers. I discussed the limitations of evaluation and management by telemedicine and  the availability of in person appointments. Patient expressed understanding and agreed to proceed. Participants in the visit: patient (location - home); physician (location - home office).  Chief Complaint: Lack of focus, forgetfulness.  HPI: 55 yo MAAF with no past psychiatric history who has been referred for medication management of depression/anxiety after being seen by [name] LCSW. She reports approximately a year long problems with feeling depressed, irritable, excessively worried, tires, having difficulty falling asleep, problems with concentration and remembering things. She feels worthless, unsure of herself. She has no suicidal or homicidal thoughts, no hallucinations, delusions. She has no prior history of depression, no hx of mania. She does nt abuse alcohol or drugs. Sheworries about her health ever since she was diagnosed with breast cancerin July last year. She had a surgery in September 2020, went through radiation therapy and is currently on Tamoxifen. She wasprescribedcitalopram by her PCPup to40 mg. She was also placed on disability given her ongoing fatigue, cognitive and emotional problems. 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 She also is in school and struggles with educational tasks due to focusing problems. [name] didnot see any clear improvement in her depressive symptoms with citalopram. She noted that since she has been on that medication she feels more restless (particularly noticeable in the evening when she goes to bed) and started to gain some weight. We have cross tapered it to fluoxetine which she tolerates much better. More anxious  (execssive worrying, ruminating) than depressed at this time. She isnow on60mg  of fluoxetine. She is using CPAP for OSA - sleep hasinitiallyimproved from 4 to 6 hours per night but recently she has been having more problems with falling and staying asleep.We have added aripiprazole for augmentation but her mood only minimally improved. Her panic attacks on the other hand resolved and she no longer is taking lorazepam.Shedescribed havingchronic problems with focusing, forgetfulness, task completion suggestive of ADD. We have added bupropion to help with fatigue/concentration and it seems to have some beneficial effect for mood as well. Shecontinued to report problems with initiation and maintaining sleep - with zolpidem CR 12.5 mg was sleeping less than 5 hours but recently prescribed trazodone appears to work better.    Visit Diagnosis:    ICD-10-CM   1. Adult ADHD  F90.9   2. GAD (generalized anxiety disorder)  F41.1   3. Panic disorder  F41.0   4. Major depressive disorder, single episode with anxious distress  F32.9     Past Psychiatric History: Please see intake H&P.  Past Medical History:  Past Medical History:  Diagnosis Date  . Abnormal uterine bleeding (AUB)   . 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 Carpal tunnel syndrome   . Depression   . Family history of breast cancer   . HA (headache)   . Hypertension   . Migraines   . OSA (obstructive sleep apnea)    Mild OSA AHI 9.3/hr now on CPAP at 6CM H2O  . Personal history of radiation therapy   . Sleep apnea    not used CPAP in over a month    Past Surgical History:  Procedure Laterality Date  . ABLATION    .  BREAST LUMPECTOMY Left 07/08/2019  . BREAST LUMPECTOMY WITH RADIOACTIVE SEED LOCALIZATION Left 07/08/2019   Procedure: LEFT BREAST LUMPECTOMY X 3  WITH RADIOACTIVE SEED LOCALIZATION;  Surgeon: Erroll Luna, MD;  Location: [facility];  Service: General;  Laterality: Left;  . BREAST  RECONSTRUCTION Left 07/15/2019   Procedure: LEFT BREAST ONCOPLASTIC RECONSTRUCTION;  Surgeon: Irene Limbo, MD;  Location: [facility];  Service: Plastics;  Laterality: Left;  . CESAREAN SECTION     x2  . DILATATION & CURETTAGE/HYSTEROSCOPY WITH MYOSURE N/A 06/04/2019   Procedure: DILATATION /HYSTEROSCOPY;  Surgeon: Janyth Pupa, DO;  Location: [facility];  Service: Gynecology;  Laterality: N/A;  . LAPAROSCOPY N/A 06/04/2019   Procedure: LAPAROSCOPY Diagnostic, Repair Uterine Perforation;  Surgeon: Janyth Pupa, DO;  Location: [facility];  Service: Gynecology;  Laterality: N/A;  . MASTOPEXY Right 07/15/2019   Procedure: RIGHT BREAST MASTOPEXY;  Surgeon: Irene Limbo, MD;  Location: [facility];  Service: Plastics;  Laterality: Right;  . tummy tuck      Family Psychiatric History: None.  Family History:  Family History  Problem Relation Age of Onset  . Hypertension Mother   . Breast cancer Sister 75  . Breast cancer Maternal Aunt        dx 50s-60s  . Lung cancer Maternal Uncle   . Breast cancer Other        MGM's sister  . Other Sister 65       MVA  . Breast cancer Other        Mother's maternal cousins - x3    Social History:  Social History   Socioeconomic History  . Marital status: Married    Spouse name: [name]  . Number of children: 2  . Years of education: college  . Highest education level: Not on file  Occupational History    Comment: [employer]  Tobacco Use  . Smoking status: Never Smoker  . Smokeless tobacco: Never Used  Vaping Use  . Vaping Use: Never used  Substance and Sexual Activity  . Alcohol use: Yes    Alcohol/week: 0.0 standard drinks    Comment: social  . Drug use: No  . Sexual activity: Yes    Birth control/protection: Surgical    Comment: ablation  Other Topics Concern  . Not on file  Social History Narrative   Patient lives at home with her husband and two children.    Patient works full time for [employer].stopped in 2015, then at [employer].   Education college.   Right handed.   Caffeine two cup daily coffee.   Social Determinants of Health   Financial Resource Strain: Not on file  Food Insecurity: Not on file  Transportation Needs: Not on file  Physical Activity: Not on file  Stress: Not on file  Social Connections: Not on file    Allergies:  Allergies  Allergen Reactions  . Imitrex [Sumatriptan] Other (See Comments)    Nauseated - Feel like passing out - lack of therapeutic effect.  . Hydrocil [Psyllium] Nausea And Vomiting  . Other     "some kind of pain medicine a long time ago made me feel nauseous"     Metabolic Disorder Labs: No results found for: HGBA1C, MPG No results found for: PROLACTIN No results found for: CHOL, TRIG, HDL, CHOLHDL, VLDL, LDLCALC No results found for: TSH  Therapeutic Level Labs: No results found for: LITHIUM No results found for: VALPROATE No components found for:  CBMZ  Current Medications: Current Outpatient Medications  Medication Sig Dispense Refill  . amphetamine-dextroamphetamine (ADDERALL XR) 20 MG 24 hr capsule Take 1 capsule (20 mg total) by mouth daily. 30 capsule 0  . [START ON 01/30/2021] amphetamine-dextroamphetamine (ADDERALL XR) 20 MG 24 hr capsule Take 1 capsule (20 mg total) by mouth daily. 30 capsule 0  . Cholecalciferol (VITAMIN D3) 125 MCG (5000 UT) CAPS Take 5,000 capsules by mouth.    . clindamycin (CLEOCIN T) 1 % lotion APPLY A SMALL AMOUNT TO SKIN TWICE A DAY AS NEEDED    . eszopiclone (LUNESTA) 2 MG TABS tablet Take 1 tablet (2 mg total) by mouth at bedtime as needed for sleep. Take immediately before bedtime 30 tablet 0  . FERREX 150 150 MG capsule Take by mouth daily.    Marland Kitchen FLUoxetine (PROZAC) 20 MG tablet Take 3 tablets (60 mg total) by mouth daily. 270 tablet 1  . hydrochlorothiazide (HYDRODIURIL) 25 MG tablet Take 25 mg by mouth daily.    . hydroquinone 4 % cream APPLY A SMALL  AMOUNT TOPICALLY EVERY DAY TO THE SKIN    . ibuprofen (ADVIL) 800 MG tablet Take 800 mg by mouth as needed.    . Multiple Vitamins-Minerals (HAIR SKIN AND NAILS FORMULA PO) Take as directed    . nabumetone (RELAFEN) 500 MG tablet Take 500 mg by mouth 2 (two) times daily as needed.    . naproxen (NAPROSYN) 500 MG tablet TAKE 1 TABLET BY MOUTH EVERY 12 HOURS AS NEEDED FOR PAIN 30 DAYS    . potassium chloride (KLOR-CON) 10 MEQ tablet TAKE 1 TABLET BY MOUTH EVERY DAY 90 tablet 4  . tamoxifen (NOLVADEX) 20 MG tablet Take 1 tablet (20 mg total) by mouth daily. 90 tablet 4  . traZODone (DESYREL) 100 MG tablet TAKE 1 TABLET BY MOUTH AT BEDTIME AS NEEDED FOR SLEEP. 90 tablet 1  . vitamin B-12 (CYANOCOBALAMIN) 500 MCG tablet Take 500 mcg by mouth daily.     No current facility-administered medications for this visit.       Psychiatric Specialty Exam: Review of Systems  Psychiatric/Behavioral: Positive for decreased concentration. The patient is nervous/anxious.   All other systems reviewed and are negative.   There were no vitals taken for this visit.There is no height or weight on file to calculate BMI.  General Appearance: NA  Eye Contact:  NA  Speech:  Clear and Coherent  Volume:  Normal  Mood:  Anxious  Affect:  NA  Thought Process:  Goal Directed  Orientation:  Full (Time, Place, and Person)  Thought Content: Logical   Suicidal Thoughts:  No  Homicidal Thoughts:  No  Memory:  Immediate;   Fair Recent;   Good Remote;   Good  Judgement:  Good  Insight:  Good  Psychomotor Activity:  NA  Concentration:  Concentration: Fair  Recall:  Peters of Knowledge: Good  Language: Good  Akathisia:  Negative  Handed:  Right  AIMS (if indicated): not done  Assets:  Communication Skills Desire for Improvement Financial Resources/Insurance Housing Social Support Talents/Skills  ADL's:  Intact  Cognition: WNL  Sleep:  Fair   Screenings: GAD-7   Health and safety inspector from  05/11/2020 in [place]  Total GAD-7 Score 13    PHQ2-9   Flowsheet Row Counselor from 05/11/2020 in [place]  PHQ-2 Total Score 2  PHQ-9 Total Score 12       Assessment and Plan: 54  yo MAAF with no past psychiatric history who has been referred for medication management of depression/anxiety after being seen by [name] LCSW. She reports approximately a year long problems with feeling depressed, irritable, excessively worried, tires, having difficulty falling asleep, problems with concentration and remembering things. She feels worthless, unsure of herself. She has no suicidal or homicidal thoughts, no hallucinations, delusions. She has no prior history of depression, no hx of mania. She does nt abuse alcohol or drugs. Sheworries about her health ever since she was diagnosed with breast cancerin July last year. She had a surgery in September 2020, went through radiation therapy and is currently on Tamoxifen. She wasprescribedcitalopram by her PCPup to40 mg. She was also placed on disability given her ongoing fatigue, cognitive and emotional problems. 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 She also is in school and struggles with educational tasks due to focusing problems. Rosaleen didnot see any clear improvement in her depressive symptoms with citalopram. She noted that since she has been on that medication she feels more restless (particularly noticeable in the evening when she goes to bed) and started to gain some weight. We have cross tapered it to fluoxetine which she tolerates much better. More anxious (execssive worrying, ruminating) than depressed at this time. She isnow on60mg  of fluoxetine. She is using CPAP for OSA - sleep hasinitiallyimproved from 4 to 6 hours per night but recently she has been having more problems with falling and staying asleep.We have  added aripiprazole for augmentation but her mood only minimally improved. Her panic attacks on the other hand resolved and she no longer is taking lorazepam.Shedescribed havingchronic problems with focusing, forgetfulness, task completion suggestive of ADD. We have added bupropion to help with fatigue/concentration and it seems to have some beneficial effect for mood as well. Shecontinued to report problems with initiation and maintaining sleep - with zolpidem CR 12.5 mg was sleeping less than 5 hours but recently prescribed trazodone appears to work better.   Dx: Major depressive disorder single episode mild; GAD;Adult ADD  Plan: We willcontinuefluoxetine60mg daily, trazodone prn occasional insomnia, stop bupropion XL 150 mg in amand add Adderall XR 20 mg daily for ADD symptoms.Next appointment in two months with a new provider. The plan was discussed with patient who had an opportunity to ask questions and these were all answered. I spend25minutes inphone consultationwith the patient.   Stephanie Acre, MD 12/30/2020, 8:35 AM

## 2020-12-30 NOTE — Telephone Encounter (Signed)
PA Case ID [id number].

## 2021-01-04 ENCOUNTER — Ambulatory Visit (INDEPENDENT_AMBULATORY_CARE_PROVIDER_SITE_OTHER): Payer: BC Managed Care – PPO | Admitting: Licensed Clinical Social Worker

## 2021-01-04 ENCOUNTER — Other Ambulatory Visit: Payer: Self-pay

## 2021-01-04 DIAGNOSIS — F909 Attention-deficit hyperactivity disorder, unspecified type: Secondary | ICD-10-CM | POA: Diagnosis not present

## 2021-01-04 DIAGNOSIS — F321 Major depressive disorder, single episode, moderate: Secondary | ICD-10-CM | POA: Diagnosis not present

## 2021-01-04 DIAGNOSIS — F411 Generalized anxiety disorder: Secondary | ICD-10-CM

## 2021-01-04 NOTE — Progress Notes (Signed)
Virtual Visit via Video Note   I connected with Kara [name] on 01/04/21 at 8:00am by video enabled telemedicine application and verified that I am speaking with the correct person using two identifiers.   I discussed the limitations, risks, security and privacy concerns of performing an evaluation and management service by video and the availability of in person appointments. I also discussed with the patient that there may be a patient responsible charge related to this service. The patient expressed understanding and agreed to proceed.   I discussed the assessment and treatment plan with the patient. The patient was provided an opportunity to ask questions and all were answered. The patient agreed with the plan and demonstrated an understanding of the instructions.    The patient was advised to call back or seek an in-person evaluation if the symptoms worsen or if the condition fails to improve as anticipated.   I provided 35 minutes of non-face-to-face time during this encounter.     [name], LCSW, LCAS ____________________ THERAPIST PROGRESS NOTE   Session Time: 8:00am - 8:35am  Location: Patient: Patient Home Provider: [facility]    Participation Level: Active   Behavioral Response: Alert, casually dressed, neatly groomed, anxious mood/affect   Type of Therapy:  Individual Therapy   Treatment Goals addressed: Anxiety/depression management; Medication management; Psychiatry followup; Exercise regimen and sleep hygiene    Interventions: CBT, nutritional and pain assessments, PHQ9, GAD7, CSSRS screenings, treatment planning    Summary: Kara [name] is a 55 year old married African American female that presented for virtual therapy today with diagnoses of Major Depressive Disorder, Single Episode, Moderate; Generalized Anxiety Disorder, and ADHD.    Suicidal/Homicidal: None; without plan or intent.     Therapist Response: Clinician spoke with Kara [name] for virtual  therapy today and assessed for safety, sobriety, and medication compliance.  Kara [name] presented to appointment on time and was alert, oriented x5, with no evidence or self-report of active SI/HI or A/V H.  Kara [name] reported ongoing compliance with medication and denied any abuse of alcohol or illicit substances.  Clinician inquired about Kara [name]'s current emotional ratings, as well as any significant changes in thoughts, feelings, or behavior since previous check-in.  Kara [name] reported scores of 4/10 for depression, 6/10 for anxiety, 5/10 for anger/irritability, and noted one panic attack a few weeks ago.  Kara [name] reported that she has been unable to set therapy appointments for some time now because she left her old job and didn't have insurance, but recently started a new one, and plans to meet once per month for therapy again.  Kara [name] reported that this new job has been less stressful, and she has been managing anxiety by revisiting skills taught from therapy.  She reported that she is still pursuing her college degree.  She reported that she recently met with her psychiatrist in past week and was started on Adderall, stating "It has been working good from what I can tell. It seems like I'm more alert, calmer, and don't fuss as much".  Kara [name] denied haven't an agenda in mind for today's session, so clinician suggested updating assessment screenings due to length of time that has passed.  Kara [name] was agreeable to this, so clinician completed nutritional and pain assessments with her first, revealing no issues to address at this time.  Kara [name] also completed updated PHQ9, GAD7, and CSSRS screenings with Kara [name], reflecting reduced symptoms and showing no risk of self-harm at this time.  Clinician also revisited treatment plan with [facility]  to make updates as follows: Schedule appointments for individual therapy once per month to check in with clinician regarding progress towards goals and any needs to be addressed during treatment; Meet with  psychiatrist once every 2-3 months to address efficacy of medication and make adjustments as needed to regimen and/or dosage; Take medication daily as prescribed to reduce symptoms and improve overall daily functioning; Reduce average depression severity level from 4/10 down to 2/10 in next 90 days by setting aside 1 hour per day towards self-care activities; Reduce average anxiety severity level from 6/10 down to a 4/10 and panic attacks from 1 to 0 monthly for the next 90 days by continuing daily practice of stress reduction techniques such as mindful breathing, progressive muscle relaxation, visualization, and grounding; Maintain sense of spiritual support/hope by watching virtual church sermons 3-4 times per month, in addition to reading daily scripture shared on social media x1 per day; Engage in 45 minutes to 1 hour of light cardio exercise daily to maintain positive mood, and physical health, in addition to following healthier diet with supervision from PCP; Ensure average nightly rest is 6 hours at a minimum via implementation of sleep hygiene techniques such as following healthy diet, meditating before bed, avoiding sugar/caffeine 1-2 hours before bed, and following a regular routine;  Maintain 40 hour per week at new job to stay productive and ensure financial stability; Work with Veterinary surgeon to successfully Theatre stage manager project for degree by September 2022. Interventions were effective, as evidenced by Kara [name] stating "It was good to catch up and do some reassessments because it showed that I've gotten a lot better.  This new job has been helpful along with the meds, and I don't worry about little things as much now".  Clinician will continue to monitor.   Plan: Follow up again virtually in 1 month.       Diagnosis: Major Depressive Disorder, Single Episode, Moderate; Generalized Anxiety Disorder, and ADHD.   [name], LCSW, LCAS 01/04/21

## 2021-04-19 DIAGNOSIS — G4733 Obstructive sleep apnea (adult) (pediatric): Secondary | ICD-10-CM | POA: Diagnosis not present

## 2021-04-19 DIAGNOSIS — Z6832 Body mass index (BMI) 32.0-32.9, adult: Secondary | ICD-10-CM | POA: Diagnosis not present

## 2021-04-19 DIAGNOSIS — I10 Essential (primary) hypertension: Secondary | ICD-10-CM | POA: Diagnosis not present

## 2021-04-19 DIAGNOSIS — F32A Depression, unspecified: Secondary | ICD-10-CM | POA: Diagnosis not present

## 2021-04-22 ENCOUNTER — Other Ambulatory Visit: Payer: Self-pay

## 2021-04-22 ENCOUNTER — Ambulatory Visit
Admission: RE | Admit: 2021-04-22 | Discharge: 2021-04-22 | Disposition: A | Discharge status: Home / Self Care | Payer: BC Managed Care – PPO | Source: Ambulatory Visit | Attending: Oncology | Admitting: Oncology

## 2021-04-22 DIAGNOSIS — R922 Inconclusive mammogram: Secondary | ICD-10-CM | POA: Diagnosis not present

## 2021-04-22 DIAGNOSIS — Z853 Personal history of malignant neoplasm of breast: Secondary | ICD-10-CM | POA: Diagnosis not present

## 2021-04-22 DIAGNOSIS — C50312 Malignant neoplasm of lower-inner quadrant of left female breast: Secondary | ICD-10-CM

## 2021-04-22 DIAGNOSIS — Z170 Estrogen receptor positive status [ER+]: Secondary | ICD-10-CM

## 2021-04-26 DIAGNOSIS — Z79899 Other long term (current) drug therapy: Secondary | ICD-10-CM | POA: Diagnosis not present

## 2021-05-27 DIAGNOSIS — Z7689 Persons encountering health services in other specified circumstances: Secondary | ICD-10-CM | POA: Diagnosis not present

## 2021-05-27 DIAGNOSIS — L739 Follicular disorder, unspecified: Secondary | ICD-10-CM | POA: Diagnosis not present

## 2021-05-27 DIAGNOSIS — S30860A Insect bite (nonvenomous) of lower back and pelvis, initial encounter: Secondary | ICD-10-CM | POA: Diagnosis not present

## 2021-05-27 DIAGNOSIS — I10 Essential (primary) hypertension: Secondary | ICD-10-CM | POA: Diagnosis not present

## 2021-05-31 ENCOUNTER — Inpatient Hospital Stay: Payer: BC Managed Care – PPO

## 2021-05-31 ENCOUNTER — Other Ambulatory Visit: Payer: Self-pay

## 2021-05-31 ENCOUNTER — Other Ambulatory Visit: Payer: Self-pay | Admitting: *Deleted

## 2021-05-31 ENCOUNTER — Inpatient Hospital Stay: Payer: BC Managed Care – PPO | Attending: Oncology | Admitting: Oncology

## 2021-05-31 VITALS — BP 179/74 | HR 72 | Temp 97.5°F | Resp 18 | Ht 64.5 in | Wt 197.8 lb

## 2021-05-31 DIAGNOSIS — Z801 Family history of malignant neoplasm of trachea, bronchus and lung: Secondary | ICD-10-CM | POA: Diagnosis not present

## 2021-05-31 DIAGNOSIS — Z170 Estrogen receptor positive status [ER+]: Secondary | ICD-10-CM

## 2021-05-31 DIAGNOSIS — C50312 Malignant neoplasm of lower-inner quadrant of left female breast: Secondary | ICD-10-CM | POA: Diagnosis not present

## 2021-05-31 DIAGNOSIS — Z803 Family history of malignant neoplasm of breast: Secondary | ICD-10-CM | POA: Insufficient documentation

## 2021-05-31 DIAGNOSIS — D649 Anemia, unspecified: Secondary | ICD-10-CM | POA: Insufficient documentation

## 2021-05-31 DIAGNOSIS — Z923 Personal history of irradiation: Secondary | ICD-10-CM | POA: Insufficient documentation

## 2021-05-31 DIAGNOSIS — Z886 Allergy status to analgesic agent status: Secondary | ICD-10-CM | POA: Diagnosis not present

## 2021-05-31 DIAGNOSIS — R232 Flushing: Secondary | ICD-10-CM | POA: Diagnosis not present

## 2021-05-31 DIAGNOSIS — Z853 Personal history of malignant neoplasm of breast: Secondary | ICD-10-CM | POA: Insufficient documentation

## 2021-05-31 DIAGNOSIS — Z8249 Family history of ischemic heart disease and other diseases of the circulatory system: Secondary | ICD-10-CM | POA: Insufficient documentation

## 2021-05-31 DIAGNOSIS — Z79899 Other long term (current) drug therapy: Secondary | ICD-10-CM | POA: Insufficient documentation

## 2021-05-31 LAB — CBC WITH DIFFERENTIAL (CANCER CENTER ONLY)
Abs Immature Granulocytes: 0.01 10*3/uL (ref 0.00–0.07)
Basophils Absolute: 0 10*3/uL (ref 0.0–0.1)
Basophils Relative: 0 %
Eosinophils Absolute: 0 10*3/uL (ref 0.0–0.5)
Eosinophils Relative: 1 %
HCT: 33.8 % — ABNORMAL LOW (ref 36.0–46.0)
Hemoglobin: 11.2 g/dL — ABNORMAL LOW (ref 12.0–15.0)
Immature Granulocytes: 0 %
Lymphocytes Relative: 28 %
Lymphs Abs: 0.9 10*3/uL (ref 0.7–4.0)
MCH: 30.4 pg (ref 26.0–34.0)
MCHC: 33.1 g/dL (ref 30.0–36.0)
MCV: 91.6 fL (ref 80.0–100.0)
Monocytes Absolute: 0.4 10*3/uL (ref 0.1–1.0)
Monocytes Relative: 10 %
Neutro Abs: 2 10*3/uL (ref 1.7–7.7)
Neutrophils Relative %: 61 %
Platelet Count: 329 10*3/uL (ref 150–400)
RBC: 3.69 MIL/uL — ABNORMAL LOW (ref 3.87–5.11)
RDW: 13.3 % (ref 11.5–15.5)
WBC Count: 3.4 10*3/uL — ABNORMAL LOW (ref 4.0–10.5)
nRBC: 0 % (ref 0.0–0.2)

## 2021-05-31 LAB — CMP (CANCER CENTER ONLY)
ALT: 15 U/L (ref 0–44)
AST: 16 U/L (ref 15–41)
Albumin: 3.5 g/dL (ref 3.5–5.0)
Alkaline Phosphatase: 55 U/L (ref 38–126)
Anion gap: 8 (ref 5–15)
BUN: 12 mg/dL (ref 6–20)
CO2: 27 mmol/L (ref 22–32)
Calcium: 9.2 mg/dL (ref 8.9–10.3)
Chloride: 105 mmol/L (ref 98–111)
Creatinine: 0.82 mg/dL (ref 0.44–1.00)
GFR, Estimated: >60 mL/min (ref >60)
Glucose, Bld: 99 mg/dL (ref 70–99)
Potassium: 3.8 mmol/L (ref 3.5–5.1)
Sodium: 140 mmol/L (ref 135–145)
Total Bilirubin: <0.2 mg/dL — ABNORMAL LOW (ref 0.3–1.2)
Total Protein: 7.9 g/dL (ref 6.5–8.1)

## 2021-05-31 MED ORDER — TAMOXIFEN CITRATE 20 MG PO TABS: 20.0000 mg | ORAL_TABLET | Freq: Every day | ORAL | 4 refills | Status: DC

## 2021-05-31 NOTE — Progress Notes (Signed)
[facility]  [phone]) [phone] Fax:(336) [phone]     ID: Kara [name] DOB: 1966-08-31  MR#: [id number]  [id number]#:[id number]  Patient Care Team: Aretta Nip, MD as PCP - General (Family Medicine) Sueanne Margarita, MD as Consulting Physician (Cardiology) Irene Limbo, MD as Consulting Physician (Plastic Surgery) Erroll Luna, MD as Consulting Physician (General Surgery) Cace Osorto, Virgie Dad, MD as Consulting Physician (Oncology) Kyung Rudd, MD as Consulting Physician (Radiation Oncology) Janyth Pupa, DO as Consulting Physician (Obstetrics and Gynecology) Chauncey Cruel, MD OTHER MD:  CHIEF COMPLAINT: Estrogen receptor positive ductal carcinoma in situ  CURRENT TREATMENT: tamoxifen   INTERVAL HISTORY: Kara [name] returns today for follow up of her noninvasive breast cancer.   She is taking Tamoxifen daily.  Hot flashes are not problem.  She has minimal vaginal wetness.  Her most recent bone density testing was completed on 08/08/2019 and was normal.    Since her last visit, she underwent bilateral diagnostic mammography with tomography at [facility] on 04/22/2021 showing: breast density category B; no evidence of malignancy in either breast.    REVIEW OF SYSTEMS: Niralya has gained about 25 pounds.  She went through menopause in the last couple of years and we discussed that.  She is excited that her son who just graduated from high school has gotten all first from the a different colleges including [employer] in jail as well as Social research officer, government.  He is a Psychologist, educational among other things.  Aside from these issues a detailed review of systems today was stable   COVID 19 VACCINATION STATUS: Status post [place] x2 with booster November 2021   HISTORY OF CURRENT ILLNESS: From the original intake note:  Malaina [name] had routine screening mammography on 04/11/2019 showing a possible abnormality in the left breast. She underwent  bilateral diagnostic mammography with tomography at [facility] on 04/17/2019 showing: breast density category C; indeterminate 4 cm calcifications in the lower-inner quadrant of the left breast.   Accordingly on 04/23/2019 she proceeded to biopsy of the left breast area in question. The pathology from this procedure ([id number]) showed: ductal carcinoma in situ with calcifications, low grade; fibrocystic and fibroadenomatoid change.  Prognostic indicators significant for: estrogen receptor, 90% positive with strong staining intensity and progesterone receptor, 60% positive with moderate staining intensity.   The patient's subsequent history is as detailed below.   PAST MEDICAL HISTORY: Past Medical History:  Diagnosis Date   Abnormal uterine bleeding (AUB)    Cancer ([place]) 04/2019   left breast DCIS   Carpal tunnel syndrome    Depression    Family history of breast cancer    HA (headache)    Hypertension    Migraines    OSA (obstructive sleep apnea)    Mild OSA AHI 9.3/hr now on CPAP at 6CM H2O   Personal history of radiation therapy    Sleep apnea    not used CPAP in over a month    PAST SURGICAL HISTORY: Past Surgical History:  Procedure Laterality Date   ABLATION     BREAST LUMPECTOMY Left 07/08/2019   BREAST LUMPECTOMY WITH RADIOACTIVE SEED LOCALIZATION Left 07/08/2019   Procedure: LEFT BREAST LUMPECTOMY X 3  WITH RADIOACTIVE SEED LOCALIZATION;  Surgeon: Erroll Luna, MD;  Location: [facility];  Service: General;  Laterality: Left;   BREAST RECONSTRUCTION Left 07/15/2019   Procedure: LEFT BREAST ONCOPLASTIC RECONSTRUCTION;  Surgeon: Irene Limbo, MD;  Location: [facility];  Service:  Plastics;  Laterality: Left;   CESAREAN SECTION     x2   DILATATION & CURETTAGE/HYSTEROSCOPY WITH MYOSURE N/A 06/04/2019   Procedure: DILATATION /HYSTEROSCOPY;  Surgeon: Janyth Pupa, DO;  Location: [facility];  Service: Gynecology;   Laterality: N/A;   LAPAROSCOPY N/A 06/04/2019   Procedure: LAPAROSCOPY Diagnostic, Repair Uterine Perforation;  Surgeon: Janyth Pupa, DO;  Location: [facility];  Service: Gynecology;  Laterality: N/A;   MASTOPEXY Right 07/15/2019   Procedure: RIGHT BREAST MASTOPEXY;  Surgeon: Irene Limbo, MD;  Location: [facility];  Service: Plastics;  Laterality: Right;   tummy tuck    right ankle surgery as a kid   FAMILY HISTORY: Family History  Problem Relation Age of Onset   Hypertension Mother    Breast cancer Sister 62   Breast cancer Maternal Aunt        dx 50s-60s   Lung cancer Maternal Uncle    Breast cancer Other        MGM's sister   Other Sister 89       MVA   Breast cancer Other        Mother's maternal cousins - x3  Patient's father is living at age 77-80. Patient's mother is also living at age 62. (as of 05/2019) The patient's maternal half-sister was diagnosed with breast cancer at age 43. Another half-sister had breast cancer before the age of 78 and has since passed away.  The patient has 2 half siblings on her mother's side and 3 half siblings on her father's. The patient denies a family hx of ovarian, prostate or pancreatic cancer.   GYNECOLOGIC HISTORY:  No LMP recorded. Patient has had an ablation. Menarche: 55 years old Age at first live birth: 55 years old [place] P 2 LMP she is currently spotting Contraceptive: used for ~25 years with no problems HRT n/a  Hysterectomy? no BSO? 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  Also she is in school for a PhD in Proofreader.. She already has a degree in chemistry. Husband [name] is a Barrister's clerk.  She lives at home with her husband and their youngest son Kara [name], age 31, who just graduated from high school and has his choice of colleges.  Older son Kara [name], lives in [place] and is studying liberal arts t at [employer].The  patient attends [employer].    ADVANCED DIRECTIVES: Li is comfortable with her husband [name] as her HCPOA   HEALTH MAINTENANCE: Social History   Tobacco Use   Smoking status: Never   Smokeless tobacco: Never  Vaping Use   Vaping Use: Never used  Substance Use Topics   Alcohol use: Yes    Alcohol/week: 0.0 standard drinks    Comment: social   Drug use: No     Colonoscopy:   PAP: Due  Bone density: never done   Allergies  Allergen Reactions   Imitrex [Sumatriptan] Other (See Comments)    Nauseated - Feel like passing out - lack of therapeutic effect.   Hydrocil [Psyllium] Nausea And Vomiting   Other     "some kind of pain medicine a long time ago made me feel nauseous"     Current Outpatient Medications  Medication Sig Dispense Refill   amphetamine-dextroamphetamine (ADDERALL XR) 20 MG 24 hr capsule Take 1 capsule (20 mg total) by mouth daily. 30 capsule 0   amphetamine-dextroamphetamine (ADDERALL XR) 20 MG  24 hr capsule Take 1 capsule (20 mg total) by mouth daily. 30 capsule 0   Cholecalciferol (VITAMIN D3) 125 MCG (5000 UT) CAPS Take 5,000 capsules by mouth.     clindamycin (CLEOCIN T) 1 % lotion APPLY A SMALL AMOUNT TO SKIN TWICE A DAY AS NEEDED     eszopiclone (LUNESTA) 2 MG TABS tablet Take 1 tablet (2 mg total) by mouth at bedtime as needed for sleep. Take immediately before bedtime 30 tablet 0   FERREX 150 150 MG capsule Take by mouth daily.     FLUoxetine (PROZAC) 20 MG tablet Take 3 tablets (60 mg total) by mouth daily. 270 tablet 1   hydrochlorothiazide (HYDRODIURIL) 25 MG tablet Take 25 mg by mouth daily.     hydroquinone 4 % cream APPLY A SMALL AMOUNT TOPICALLY EVERY DAY TO THE SKIN     ibuprofen (ADVIL) 800 MG tablet Take 800 mg by mouth as needed.     Multiple Vitamins-Minerals (HAIR SKIN AND NAILS FORMULA PO) Take as directed     nabumetone (RELAFEN) 500 MG tablet Take 500 mg by mouth 2 (two) times daily as needed.     naproxen (NAPROSYN) 500 MG  tablet TAKE 1 TABLET BY MOUTH EVERY 12 HOURS AS NEEDED FOR PAIN 30 DAYS     potassium chloride (KLOR-CON) 10 MEQ tablet TAKE 1 TABLET BY MOUTH EVERY DAY 90 tablet 4   tamoxifen (NOLVADEX) 20 MG tablet Take 1 tablet (20 mg total) by mouth daily. 90 tablet 4   traZODone (DESYREL) 100 MG tablet TAKE 1 TABLET BY MOUTH AT BEDTIME AS NEEDED FOR SLEEP. 90 tablet 1   vitamin B-12 (CYANOCOBALAMIN) 500 MCG tablet Take 500 mcg by mouth daily.     No current facility-administered medications for this visit.    OBJECTIVE: African-American woman in no acute distress  Vitals:   05/31/21 1548  BP: (!) 179/74  Pulse: 72  Resp: 18  Temp: (!) 97.5 F (36.4 C)  SpO2: 100%      Body mass index is 33.43 kg/m.   Wt Readings from Last 3 Encounters:  05/31/21 197 lb 12.8 oz (89.7 kg)  12/06/20 187 lb 14.4 oz (85.2 kg)  06/02/20 178 lb 4.8 oz (80.9 kg)      ECOG FS:1 - Symptomatic but completely ambulatory  Sclerae unicteric, EOMs intact Wearing a mask No cervical or supraclavicular adenopathy Lungs no rales or rhonchi Heart regular rate and rhythm Abd soft, nontender, positive bowel sounds MSK no focal spinal tenderness, no upper extremity lymphedema Neuro: nonfocal, well oriented, appropriate affect Breasts: There is no evidence of local recurrence.  Both axillae are benign.   LAB RESULTS:  CMP     Component Value Date/Time   NA 139 12/06/2020 1502   NA 138 11/02/2014 0932   K 3.6 12/06/2020 1502   CL 103 12/06/2020 1502   CO2 27 12/06/2020 1502   GLUCOSE 132 (H) 12/06/2020 1502   BUN 12 12/06/2020 1502   BUN 16 11/02/2014 0932   CREATININE 1.01 (H) 12/06/2020 1502   CALCIUM 8.9 12/06/2020 1502   PROT 7.5 12/23/2019 1153   ALBUMIN 3.7 12/23/2019 1153   AST 14 (L) 12/23/2019 1153   ALT 9 12/23/2019 1153   ALKPHOS 47 12/23/2019 1153   BILITOT 0.3 12/23/2019 1153   GFRNONAA >60 12/06/2020 1502   GFRAA >60 12/23/2019 1153   Lab Results  Component Value Date   WBC 3.4 (L)  05/31/2021   NEUTROABS 2.0 05/31/2021   HGB  11.2 (L) 05/31/2021   HCT 33.8 (L) 05/31/2021   MCV 91.6 05/31/2021   PLT 329 05/31/2021    No results found for: LABCA2  No components found for: KCLEXN170  No results for input(s): INR in the last 168 hours.  No results found for: LABCA2  No results found for: YFV494  No results found for: WHQ759  No results found for: FMB846  No results found for: CA2729  No components found for: HGQUANT  No results found for: CEA1 / No results found for: CEA1   No results found for: AFPTUMOR  No results found for: CHROMOGRNA  No results found for: TOTALPROTELP, ALBUMINELP, A1GS, A2GS, BETS, BETA2SER, GAMS, MSPIKE, SPEI (this displays SPEP labs)  No results found for: KPAFRELGTCHN, LAMBDASER, KAPLAMBRATIO (kappa/lambda light chains)  No results found for: HGBA, HGBA2QUANT, HGBFQUANT, HGBSQUAN (Hemoglobinopathy evaluation)   No results found for: LDH  No results found for: IRON, TIBC, IRONPCTSAT (Iron and TIBC)  No results found for: FERRITIN  Urinalysis No results found for: COLORURINE, APPEARANCEUR, LABSPEC, PHURINE, GLUCOSEU, HGBUR, BILIRUBINUR, KETONESUR, PROTEINUR, UROBILINOGEN, NITRITE, LEUKOCYTESUR   STUDIES: No results found.   ELIGIBLE FOR AVAILABLE RESEARCH PROTOCOL: opted against COMET  ASSESSMENT: 55 y.o.  [place] woman status post left breast biopsy 04/23/2023 ductal carcinoma in situ, low-grade, estrogen and progesterone receptor positive  (1) s/p left lumpectomy on 07/08/2019 showing 0.2cm DCIS, grade 1, margins negative  (a) s/p bilateral mammoplasty on 07/15/2019  (2) adjuvant radiation 08/18/19 - 10/06/19  Site/dose:   The patient initially received a dose of 50.4 Gy in 28 fractions to the breast using whole-breast tangent fields. This was delivered using a 3-D conformal technique. The patient then received a boost to the seroma. This delivered an additional 10 Gy in 5 fractions using a 3-field photon boost  technique. The total dose was 60.4 Gy.    (3) tamoxifen started December 2020  (a) on 08/07/2019 her LH was 35.6 and FSH 46.3, with estradiol 37.5  (4) genetics testing 05/30/2019 through the [facility] STAT Panel + Common Hereditary Cancers Panel found no deleterious mutations in ATM, BRCA1, BRCA2, CDH1, CHEK2, PALB2, PTEN, STK11 and TP53. or in APC, ATM, AXIN2, BARD1, BMPR1A, BRCA1, BRCA2, BRIP1, CDH1, CDKN2A (p14ARF), CDKN2A (p16INK4a), CKD4, CHEK2, CTNNA1, DICER1, EPCAM (Deletion/duplication testing only), GREM1 (promoter region deletion/duplication testing only), KIT, MEN1, MLH1, MSH2, MSH3, MSH6, MUTYH, NBN, NF1, NHTL1, PALB2, PDGFRA, PMS2, POLD1, POLE, PTEN, RAD50, RAD51C, RAD51D, RNF43, SDHB, SDHC, SDHD, SMAD4, SMARCA4. STK11, TP53, TSC1, TSC2, and VHL.  The following genes were evaluated for sequence changes only: SDHA and HOXB13 c.251G>A variant only.    PLAN: Sherrilyn is coming up on 2 years from definitive surgery for her breast cancer with no evidence of disease recurrence.  This is very favorable.  She is tolerating tamoxifen well.  The plan is to continue that a total of 5 years.  We discussed weight gain after menopause.  She has a very good understanding of the benefits of exercise and she has good equipment at home.  She tells me she is going to get on that.  As far as diet is concerned we discussed avoiding carbohydrates and I gave her a list of things to avoid and things to eat without limitations  Reviewing her labs she does have a steady minimal anemia, and her white cell count also runs on the low side.  She had a B12 level a couple years ago which was elevated.  I Minna set her up for an anemia panel  and a couple of other studies and I will let her know those results.  Otherwise she will return to see [place] in a year.  She knows to call for any other issue that may develop before then.  Total encounter time 30 minutes.Sarajane Jews C. Marnae Madani, MD 05/31/21 4:00 PM Medical Oncology  and Hematology [facility] [place], [place] [address] Tel. [phone]    Fax. [phone]   I, Wilburn Mylar, am acting as scribe for Dr. Virgie Dad. Karthik Whittinghill.  I, Lurline Del MD, have reviewed the above documentation for accuracy and completeness, and I agree with the above.   *Total Encounter Time as defined by the Centers for Medicare and Medicaid Services includes, in addition to the face-to-face time of a patient visit (documented in the note above) non-face-to-face time: obtaining and reviewing outside history, ordering and reviewing medications, tests or procedures, care coordination (communications with other health care professionals or caregivers) and documentation in the medical record.

## 2021-06-01 DIAGNOSIS — M170 Bilateral primary osteoarthritis of knee: Secondary | ICD-10-CM | POA: Diagnosis not present

## 2021-06-01 IMAGING — MG STEREOTACTIC CORE NEEDLE BIOPSY
8 of 15 series · 8 of 27 positions shown · non-contrast
Comparison: Previous exams.
COMPARISON: Previous exams.

Addendum:
CLINICAL DATA: Calcifications identified in the lower inner
quadrant of the left breast and recommended for stereotactic biopsy.

EXAM:
LEFT BREAST STEREOTACTIC CORE NEEDLE BIOPSY

[L (1 of 6)]
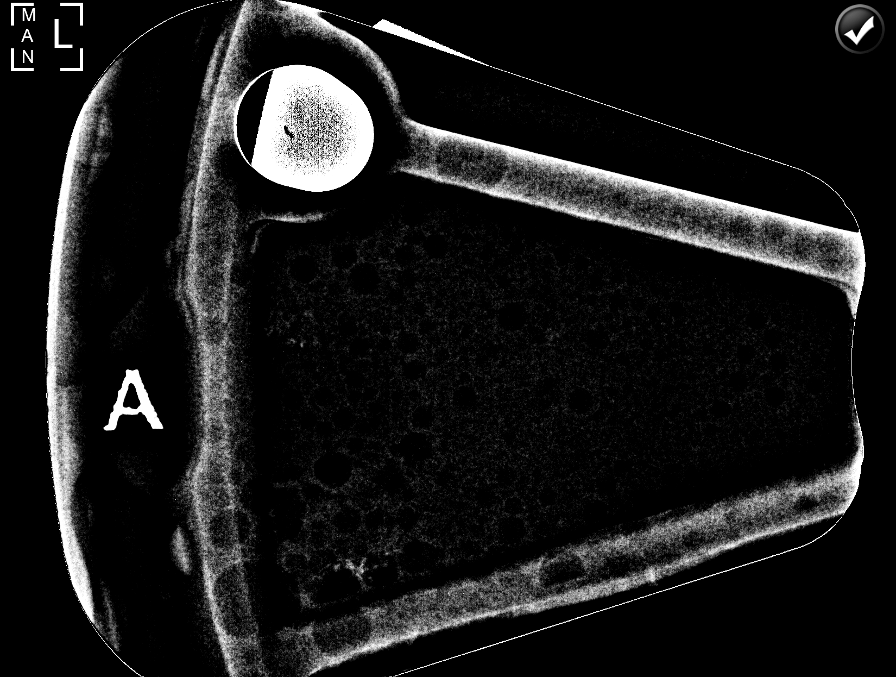

[L (2 of 6)]
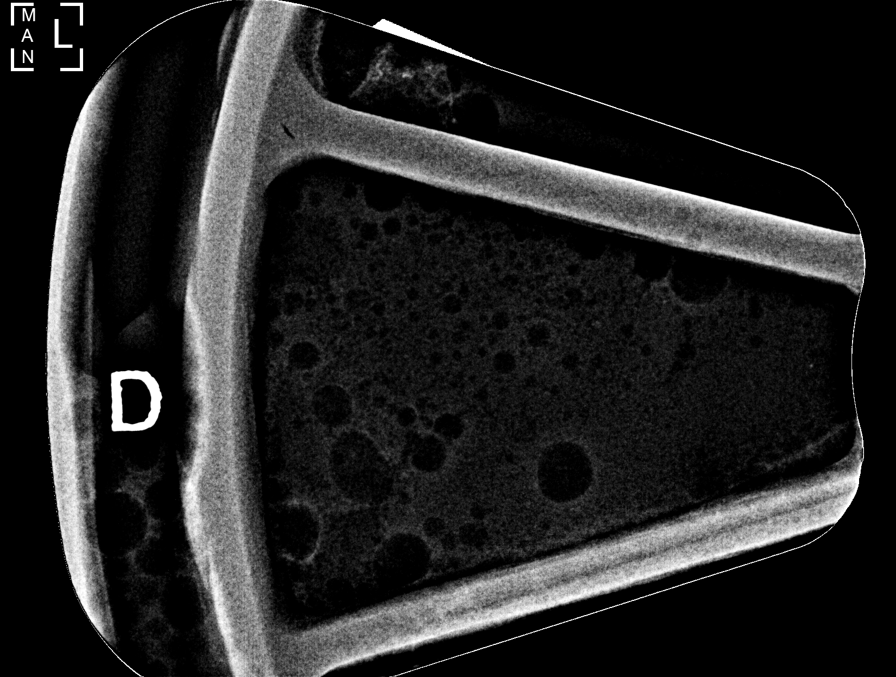

[L (3 of 6)]
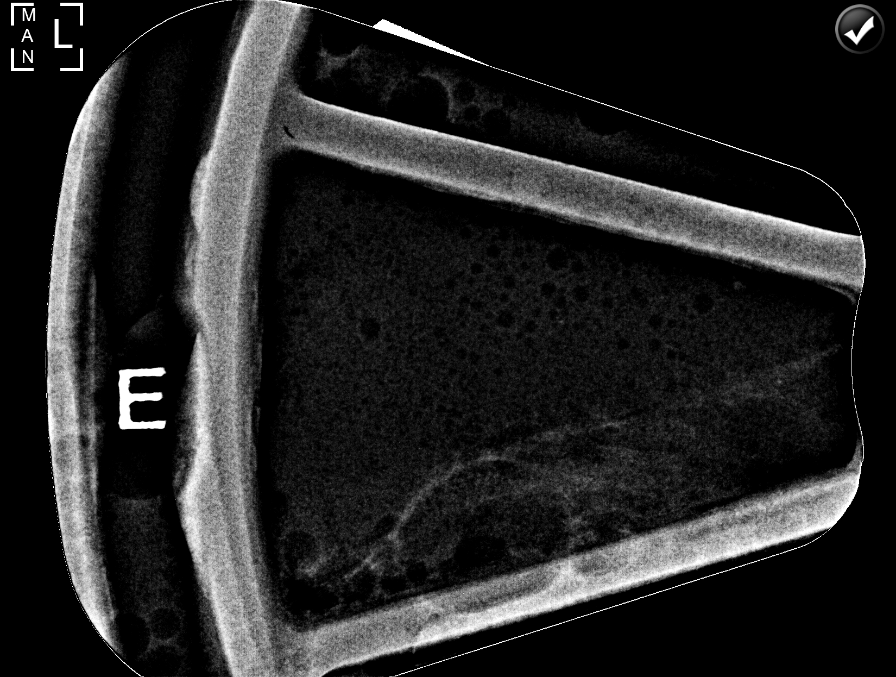

[L (4 of 6)]
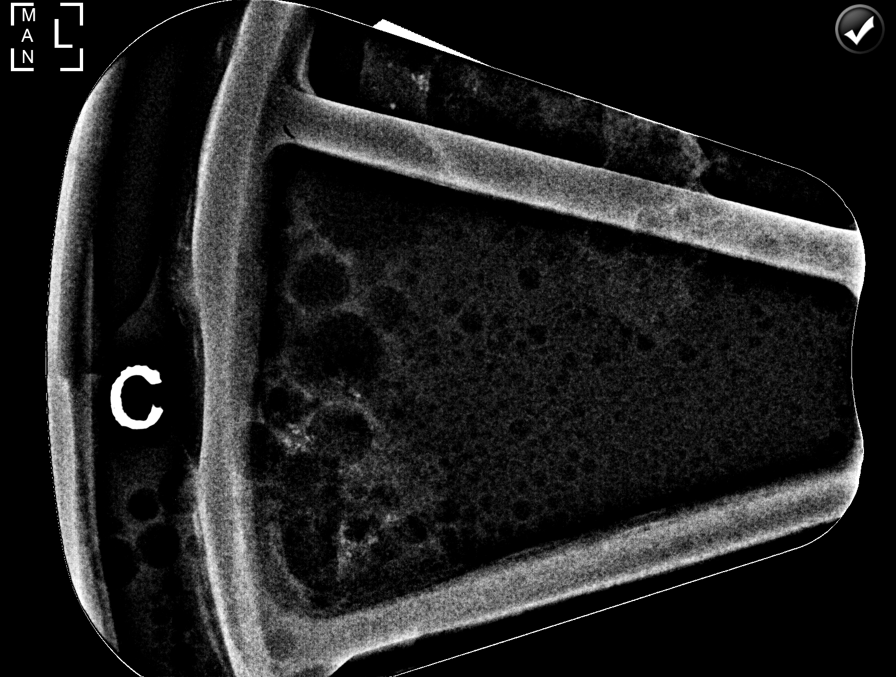

[L (5 of 6)]
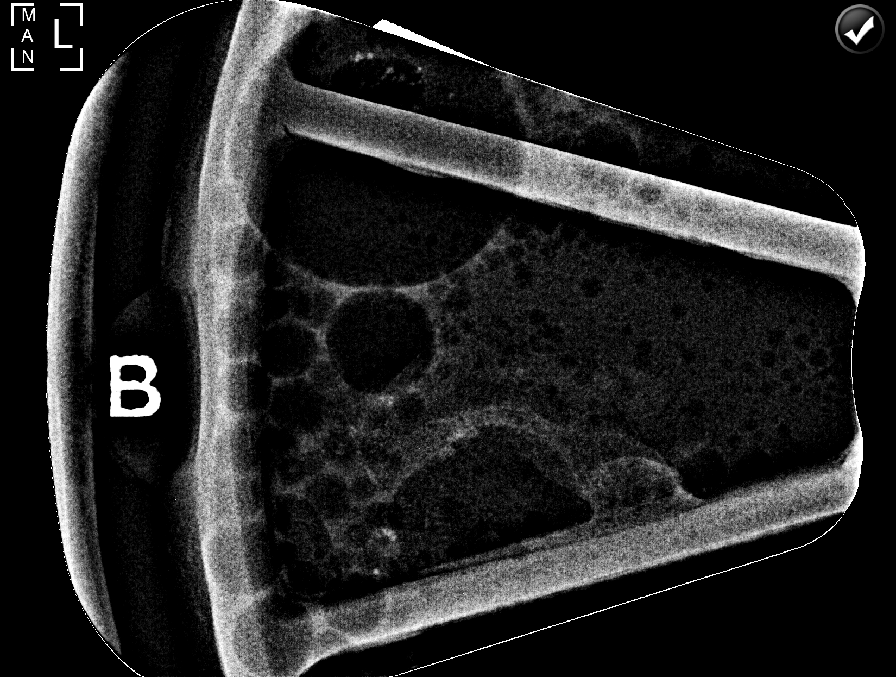

[L (6 of 6)]
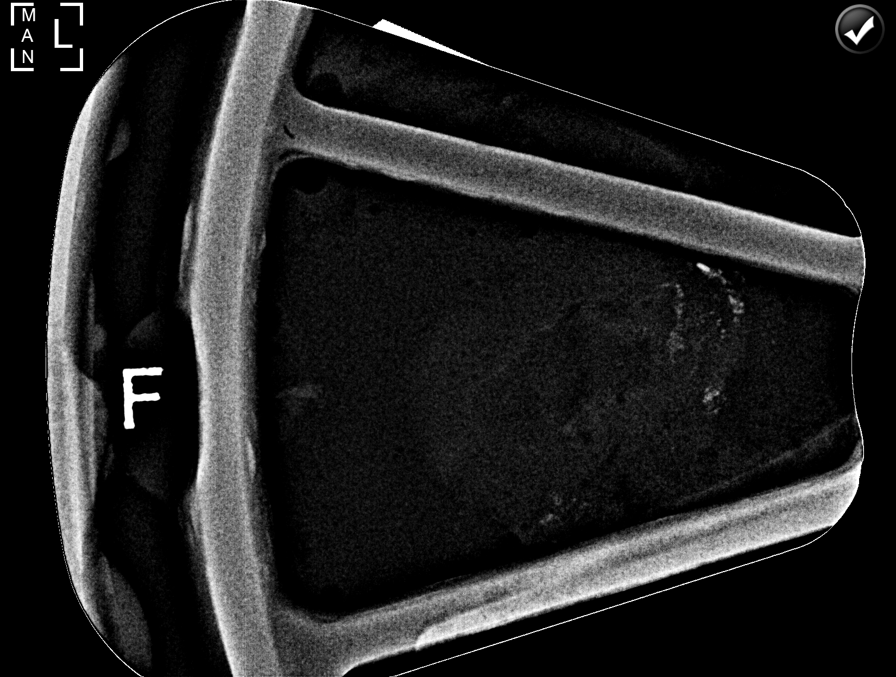

[L ML (1 of 2)]
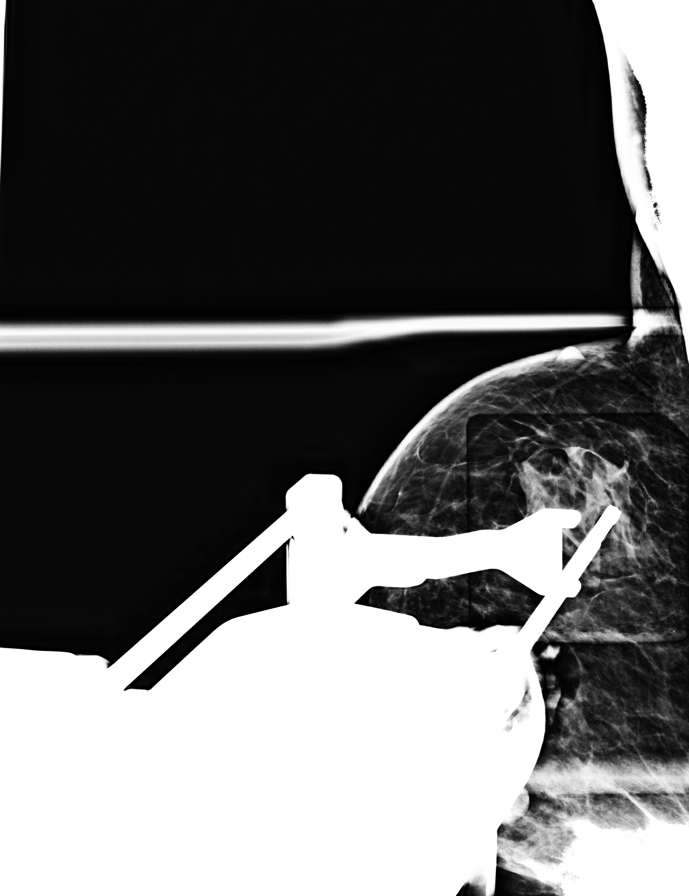

[L ML (2 of 2)]
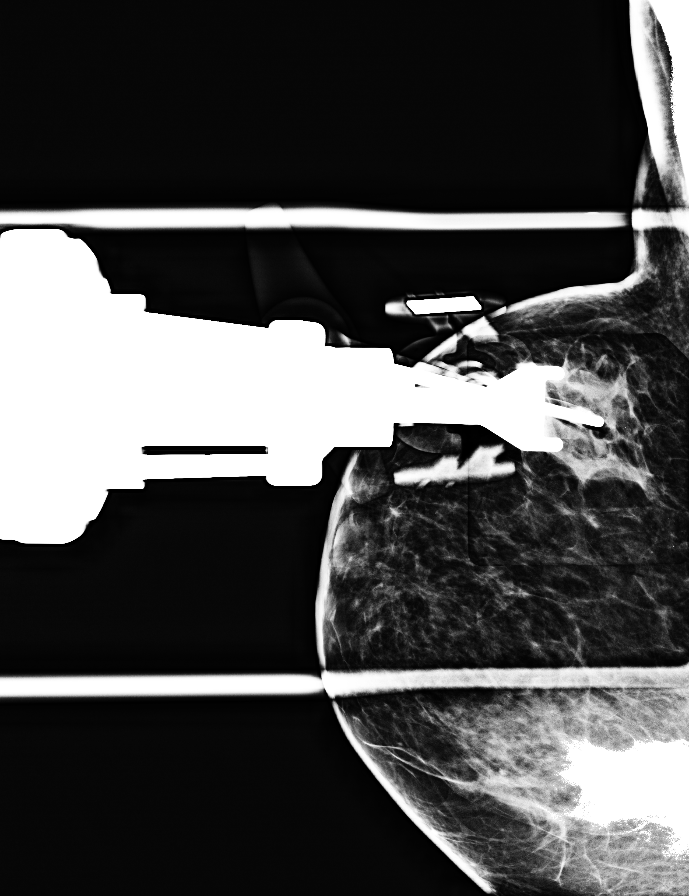

[8 of 27 positions shown; findings below may reference images not displayed]



Using sterile technique and 1% Lidocaine with and without
epinephrine as local anesthetic, under stereotactic guidance, a 9
gauge vacuum assisted device was used to perform core needle biopsy
of calcifications in the lower inner quadrant of the left breast
using a medial approach. Specimen radiograph was performed showing
multiple calcifications in multiple specimens. Specimens with
calcifications are identified for pathology.

Lesion quadrant: Lower inner quadrant

At the conclusion of the procedure, a coil tissue marker clip was
deployed into the biopsy cavity. Follow-up 2-view mammogram was
performed and dictated separately.
IMPRESSION: Stereotactic-guided biopsy of the left breast. No apparent
complications.

ADDENDUM:
Pathology revealed LOW GRADE DUCTAL CARCINOMA IN SITU WITH
CALCIFICATIONS, FIBROCYSTIC AND FIBROADENOMATOID CHANGE of the Left
breast, lower inner quadrant. This was found to be concordant by Dr.
Rutilio Billy.

Pathology results were discussed with the patient by telephone. The
patient reported doing well after the biopsy with tenderness at the
site. Post biopsy instructions and care were reviewed and questions
were answered. The patient was encouraged to call The [REDACTED]

Surgical consultation has been arranged with Dr. Inzo Triste at
[REDACTED] on May 02, 2019.

Pathology results reported by Kacimo Behar, RN on 04/25/2019.



Using sterile technique and 1% Lidocaine with and without
epinephrine as local anesthetic, under stereotactic guidance, a 9
gauge vacuum assisted device was used to perform core needle biopsy
of calcifications in the lower inner quadrant of the left breast
using a medial approach. Specimen radiograph was performed showing
multiple calcifications in multiple specimens. Specimens with
calcifications are identified for pathology.

Lesion quadrant: Lower inner quadrant

At the conclusion of the procedure, a coil tissue marker clip was
deployed into the biopsy cavity. Follow-up 2-view mammogram was
performed and dictated separately.
IMPRESSION: Stereotactic-guided biopsy of the left breast. No apparent
complications.

## 2021-06-16 DIAGNOSIS — Z9889 Other specified postprocedural states: Secondary | ICD-10-CM | POA: Diagnosis not present

## 2021-06-16 DIAGNOSIS — Z853 Personal history of malignant neoplasm of breast: Secondary | ICD-10-CM | POA: Diagnosis not present

## 2021-06-16 DIAGNOSIS — F419 Anxiety disorder, unspecified: Secondary | ICD-10-CM | POA: Diagnosis not present

## 2021-06-16 DIAGNOSIS — Z01419 Encounter for gynecological examination (general) (routine) without abnormal findings: Secondary | ICD-10-CM | POA: Diagnosis not present

## 2021-06-17 ENCOUNTER — Inpatient Hospital Stay: Payer: BC Managed Care – PPO | Attending: Oncology

## 2021-06-17 ENCOUNTER — Other Ambulatory Visit: Payer: Self-pay

## 2021-06-17 ENCOUNTER — Other Ambulatory Visit: Payer: Self-pay | Admitting: *Deleted

## 2021-06-17 DIAGNOSIS — C50312 Malignant neoplasm of lower-inner quadrant of left female breast: Secondary | ICD-10-CM | POA: Insufficient documentation

## 2021-06-17 DIAGNOSIS — Z170 Estrogen receptor positive status [ER+]: Secondary | ICD-10-CM | POA: Diagnosis not present

## 2021-06-17 LAB — CBC WITH DIFFERENTIAL (CANCER CENTER ONLY)
Abs Immature Granulocytes: 0.01 10*3/uL (ref 0.00–0.07)
Basophils Absolute: 0 10*3/uL (ref 0.0–0.1)
Basophils Relative: 0 %
Eosinophils Absolute: 0 10*3/uL (ref 0.0–0.5)
Eosinophils Relative: 2 %
HCT: 34.8 % — ABNORMAL LOW (ref 36.0–46.0)
Hemoglobin: 11.7 g/dL — ABNORMAL LOW (ref 12.0–15.0)
Immature Granulocytes: 0 %
Lymphocytes Relative: 17 %
Lymphs Abs: 0.5 10*3/uL — ABNORMAL LOW (ref 0.7–4.0)
MCH: 30.5 pg (ref 26.0–34.0)
MCHC: 33.6 g/dL (ref 30.0–36.0)
MCV: 90.9 fL (ref 80.0–100.0)
Monocytes Absolute: 0.3 10*3/uL (ref 0.1–1.0)
Monocytes Relative: 12 %
Neutro Abs: 1.9 10*3/uL (ref 1.7–7.7)
Neutrophils Relative %: 69 %
Platelet Count: 295 10*3/uL (ref 150–400)
RBC: 3.83 MIL/uL — ABNORMAL LOW (ref 3.87–5.11)
RDW: 13.3 % (ref 11.5–15.5)
WBC Count: 2.7 10*3/uL — ABNORMAL LOW (ref 4.0–10.5)
nRBC: 0 % (ref 0.0–0.2)

## 2021-06-17 LAB — CMP (CANCER CENTER ONLY)
ALT: 13 U/L (ref 0–44)
AST: 16 U/L (ref 15–41)
Albumin: 3.5 g/dL (ref 3.5–5.0)
Alkaline Phosphatase: 51 U/L (ref 38–126)
Anion gap: 10 (ref 5–15)
BUN: 12 mg/dL (ref 6–20)
CO2: 29 mmol/L (ref 22–32)
Calcium: 9.3 mg/dL (ref 8.9–10.3)
Chloride: 103 mmol/L (ref 98–111)
Creatinine: 0.88 mg/dL (ref 0.44–1.00)
GFR, Estimated: >60 mL/min (ref >60)
Glucose, Bld: 92 mg/dL (ref 70–99)
Potassium: 3.2 mmol/L — ABNORMAL LOW (ref 3.5–5.1)
Sodium: 142 mmol/L (ref 135–145)
Total Bilirubin: 0.4 mg/dL (ref 0.3–1.2)
Total Protein: 7.7 g/dL (ref 6.5–8.1)

## 2021-06-17 LAB — IRON AND TIBC
Iron: 72 ug/dL (ref 41–142)
Saturation Ratios: 25 % (ref 21–57)
TIBC: 288 ug/dL (ref 236–444)
UIBC: 215 ug/dL (ref 120–384)

## 2021-06-17 LAB — SAVE SMEAR(SSMR), FOR PROVIDER SLIDE REVIEW

## 2021-06-17 LAB — FERRITIN: Ferritin: 422 ng/mL — ABNORMAL HIGH (ref 11–307)

## 2021-06-17 LAB — VITAMIN B12: Vitamin B-12: 2048 pg/mL — ABNORMAL HIGH (ref 180–914)

## 2021-06-17 LAB — C-REACTIVE PROTEIN: CRP: 1.1 mg/dL — ABNORMAL HIGH (ref <1.0)

## 2021-06-20 ENCOUNTER — Other Ambulatory Visit: Payer: Self-pay | Admitting: Oncology

## 2021-06-27 ENCOUNTER — Telehealth: Payer: Self-pay | Admitting: *Deleted

## 2021-06-27 ENCOUNTER — Other Ambulatory Visit: Payer: Self-pay | Admitting: *Deleted

## 2021-06-27 ENCOUNTER — Other Ambulatory Visit: Payer: Self-pay | Admitting: Oncology

## 2021-06-27 DIAGNOSIS — R110 Nausea: Secondary | ICD-10-CM | POA: Diagnosis not present

## 2021-06-27 DIAGNOSIS — Z23 Encounter for immunization: Secondary | ICD-10-CM | POA: Diagnosis not present

## 2021-06-27 DIAGNOSIS — K5900 Constipation, unspecified: Secondary | ICD-10-CM | POA: Diagnosis not present

## 2021-06-27 NOTE — Progress Notes (Signed)
I called Kara [name] and gave her the results of her labs.  She has a mildly low potassium and I suggested she eat some fruit every day.  The ferritin is a little elevated.  It is an acute phase reactant.  Her iron saturation however is in the normal range.  Her B12 is up but she has been on supplementation.  Her C-reactive protein is minimally up.  Her white cell count is a little down but her platelets are fine and her hemoglobin is actually better than before.  Going back to her prior white cell counts she has had low counts before and the pattern suggest cyclic leukopenia.  In the absence of any intercurrent infections this is benign.  Her ANA is still pending and when I get that she will get a letter with these interpretations but she was able to understand them today when we talked and she was appreciative of the call even though it was a bit delayed.

## 2021-06-28 DIAGNOSIS — M170 Bilateral primary osteoarthritis of knee: Secondary | ICD-10-CM | POA: Diagnosis not present

## 2021-06-28 LAB — ANTINUCLEAR ANTIBODIES, IFA: ANA Ab, IFA: NEGATIVE

## 2021-06-30 ENCOUNTER — Other Ambulatory Visit: Payer: Self-pay | Admitting: Oncology

## 2021-06-30 ENCOUNTER — Encounter: Payer: Self-pay | Admitting: Oncology

## 2021-06-30 DIAGNOSIS — E669 Obesity, unspecified: Secondary | ICD-10-CM | POA: Diagnosis not present

## 2021-06-30 DIAGNOSIS — Z6831 Body mass index (BMI) 31.0-31.9, adult: Secondary | ICD-10-CM | POA: Diagnosis not present

## 2021-06-30 DIAGNOSIS — K2100 Gastro-esophageal reflux disease with esophagitis, without bleeding: Secondary | ICD-10-CM | POA: Diagnosis not present

## 2021-06-30 DIAGNOSIS — I10 Essential (primary) hypertension: Secondary | ICD-10-CM | POA: Diagnosis not present

## 2021-07-06 DIAGNOSIS — M170 Bilateral primary osteoarthritis of knee: Secondary | ICD-10-CM | POA: Diagnosis not present

## 2021-07-13 DIAGNOSIS — M170 Bilateral primary osteoarthritis of knee: Secondary | ICD-10-CM | POA: Diagnosis not present

## 2021-07-18 IMAGING — MG MM BREAST LOCALIZATION CLIP
6 series · 6 of 14 positions shown · non-contrast
Comparison: Previous exam(s).

CLINICAL DATA: Post biopsy mammogram of the left breast for clip
placement.

EXAM:
DIAGNOSTIC LEFT MAMMOGRAM POST STEREOTACTIC BIOPSY

[L CC]
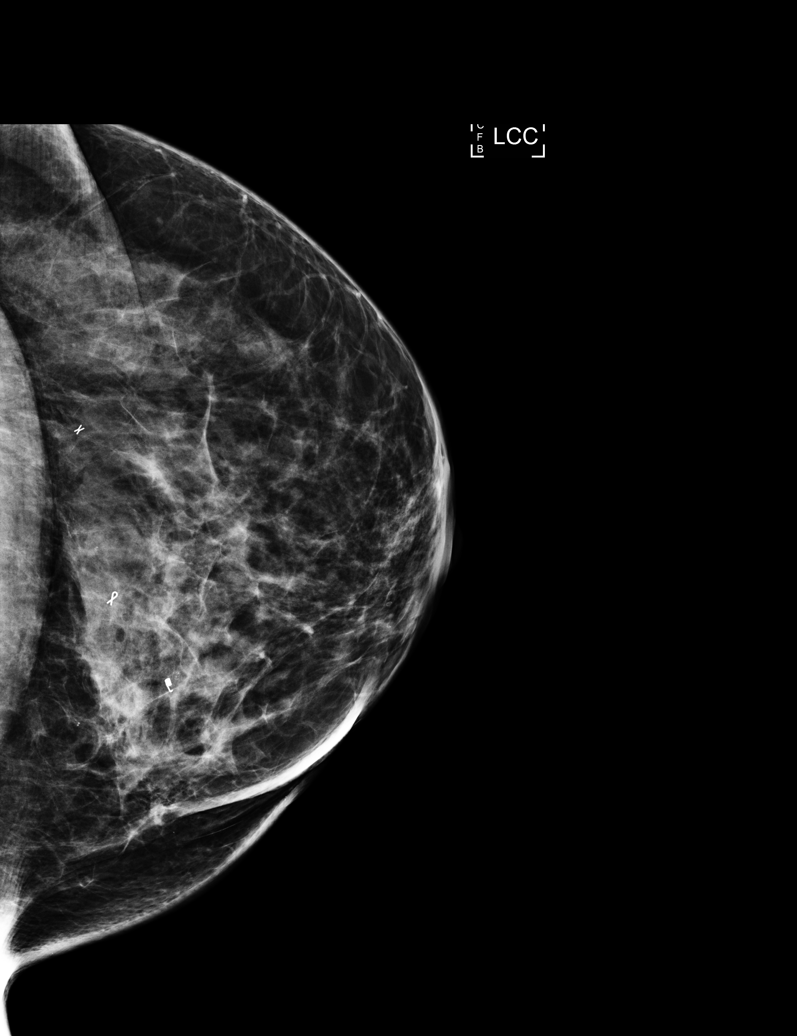

[L ML]
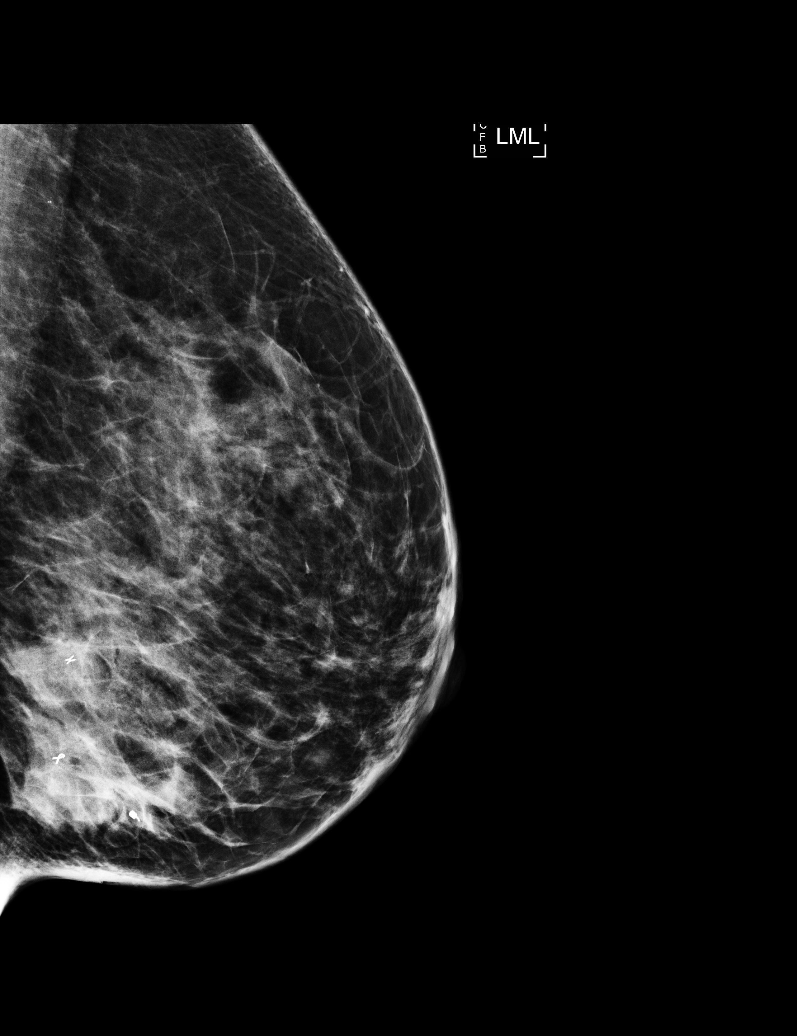

[L CC synth-2D]
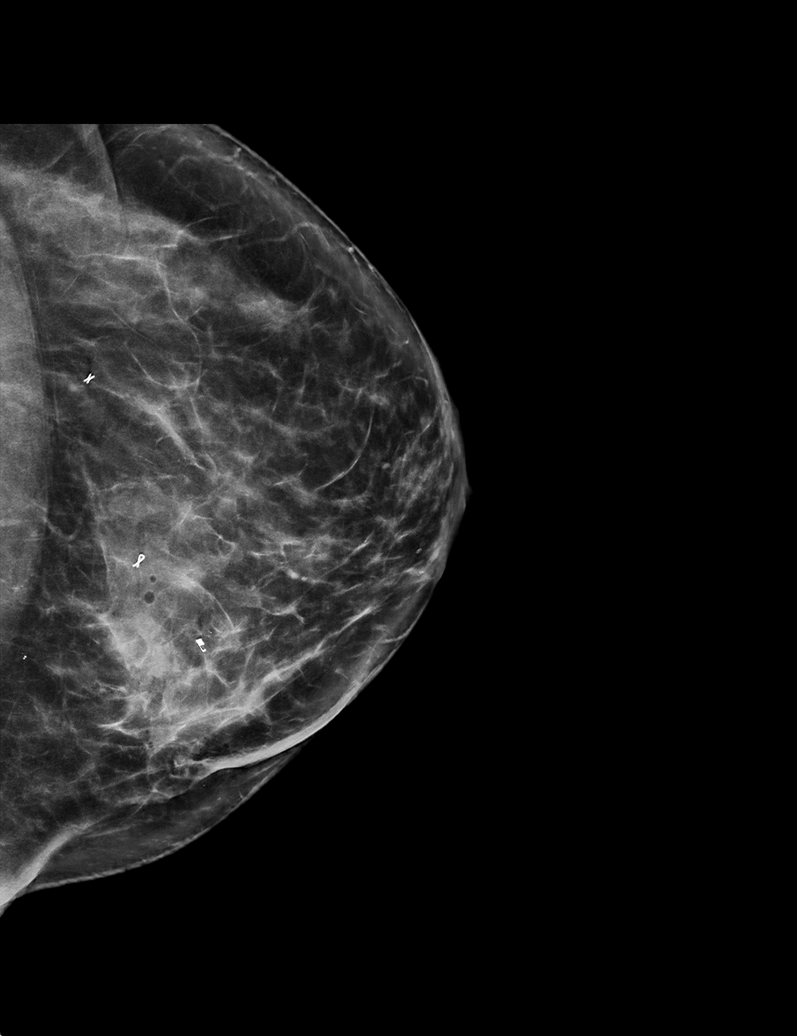

[L ML synth-2D]
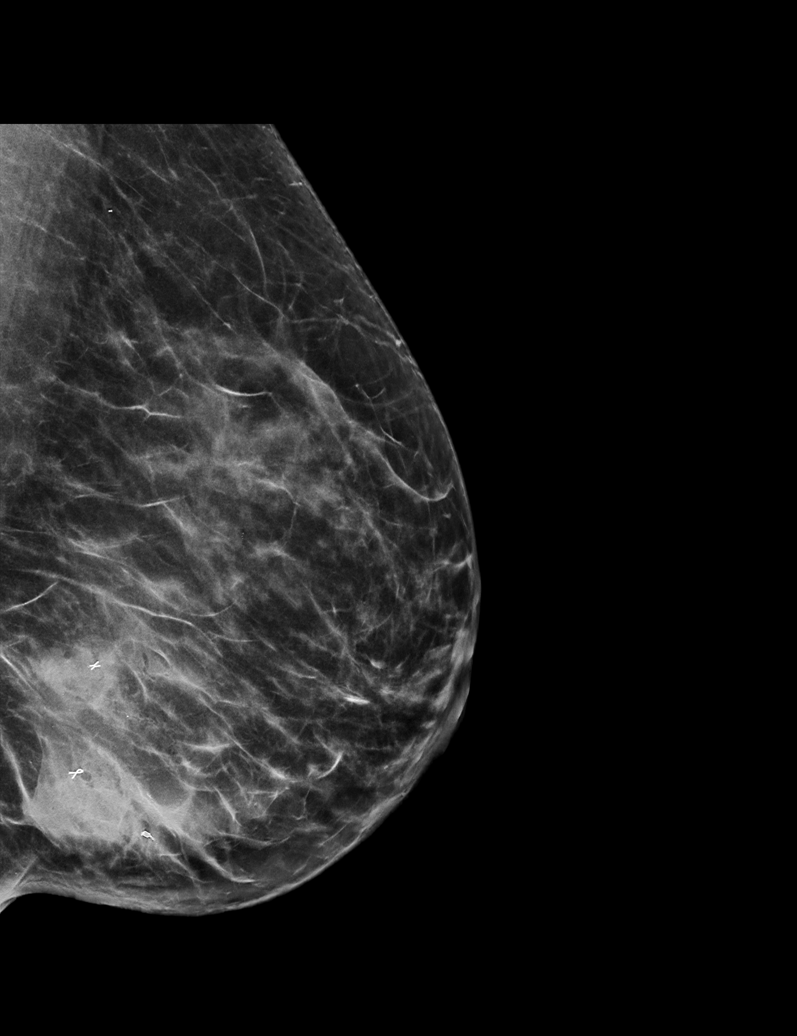

[L ML tomo · tomo slice 33/65.0]
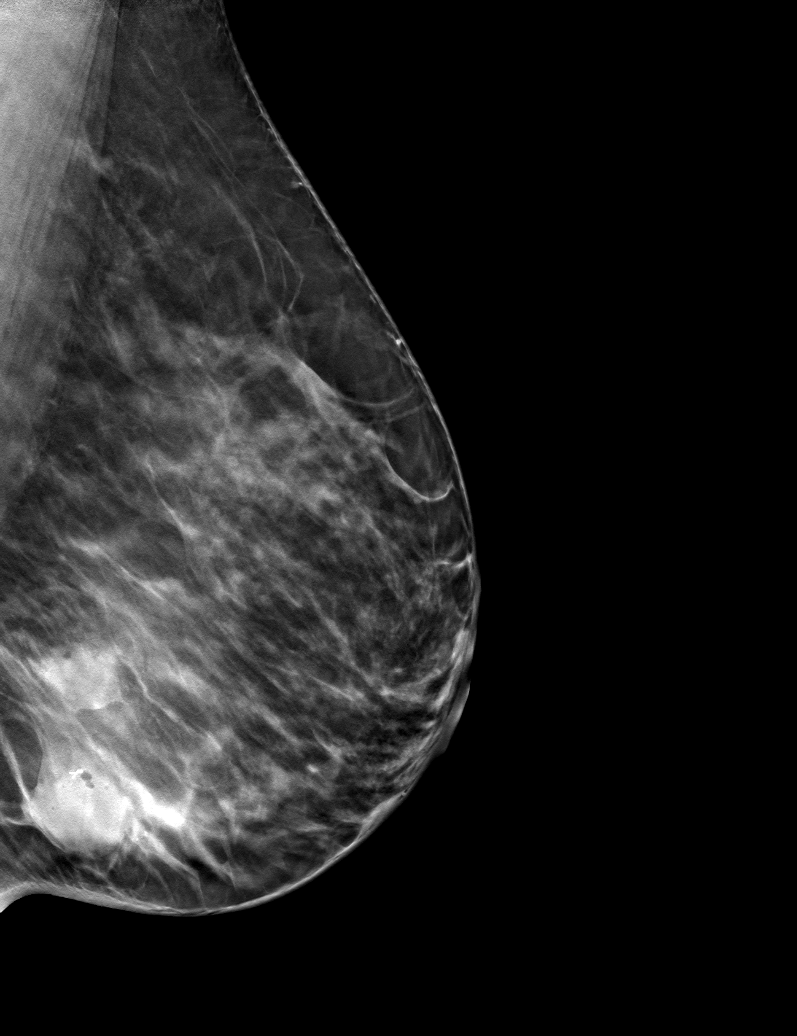

[L CC tomo · tomo slice 37/72.0]
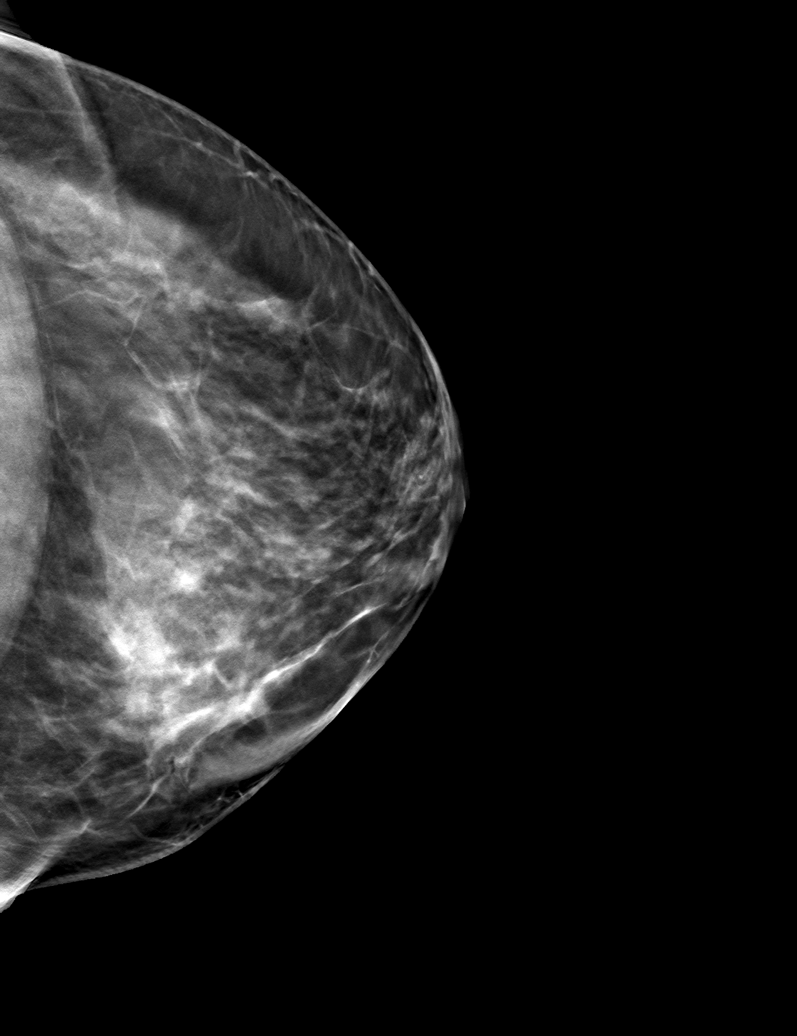

[6 of 14 positions shown; findings below may reference images not displayed]

FINDINGS: Mammographic images were obtained following stereotactic guided
biopsy of 2 sites of calcifications in the left breast. The X shaped
biopsy marking clip in the central left breast is approximately 2 cm
laterally displaced from the site of biopsy. The ribbon shaped
biopsy marking clip is well positioned at the site of biopsy in the
lower inner posterior left breast.
IMPRESSION: 1. The X shaped biopsy marking clip is approximately 2 cm laterally
displaced from the site of biopsy.

2. The ribbon shaped biopsy marking clip is well positioned at the
site of biopsy in the lower inner posterior left breast.

Final Assessment: Post Procedure Mammograms for Marker Placement

## 2021-08-02 DIAGNOSIS — F99 Mental disorder, not otherwise specified: Secondary | ICD-10-CM | POA: Diagnosis not present

## 2021-08-03 DIAGNOSIS — F99 Mental disorder, not otherwise specified: Secondary | ICD-10-CM | POA: Diagnosis not present

## 2021-08-09 NOTE — Telephone Encounter (Signed)
No entry 

## 2021-08-11 DIAGNOSIS — R350 Frequency of micturition: Secondary | ICD-10-CM | POA: Diagnosis not present

## 2021-10-13 DIAGNOSIS — B349 Viral infection, unspecified: Secondary | ICD-10-CM | POA: Diagnosis not present

## 2021-10-25 ENCOUNTER — Other Ambulatory Visit: Payer: Self-pay | Admitting: Oncology

## 2021-10-31 DIAGNOSIS — N390 Urinary tract infection, site not specified: Secondary | ICD-10-CM | POA: Diagnosis not present

## 2021-10-31 DIAGNOSIS — R300 Dysuria: Secondary | ICD-10-CM | POA: Diagnosis not present

## 2021-10-31 DIAGNOSIS — R1030 Lower abdominal pain, unspecified: Secondary | ICD-10-CM | POA: Diagnosis not present

## 2021-11-18 ENCOUNTER — Other Ambulatory Visit: Payer: Self-pay | Admitting: *Deleted

## 2021-11-21 ENCOUNTER — Other Ambulatory Visit: Payer: Self-pay | Admitting: *Deleted

## 2021-11-21 MED ORDER — POTASSIUM CHLORIDE ER 10 MEQ PO TBCR: 10.0000 meq | EXTENDED_RELEASE_TABLET | Freq: Every day | ORAL | 1 refills | Status: AC

## 2021-12-30 DIAGNOSIS — K219 Gastro-esophageal reflux disease without esophagitis: Secondary | ICD-10-CM | POA: Diagnosis not present

## 2021-12-30 DIAGNOSIS — I10 Essential (primary) hypertension: Secondary | ICD-10-CM | POA: Diagnosis not present

## 2021-12-30 DIAGNOSIS — E559 Vitamin D deficiency, unspecified: Secondary | ICD-10-CM | POA: Diagnosis not present

## 2021-12-30 DIAGNOSIS — E663 Overweight: Secondary | ICD-10-CM | POA: Diagnosis not present

## 2022-01-09 DIAGNOSIS — R350 Frequency of micturition: Secondary | ICD-10-CM | POA: Diagnosis not present

## 2022-02-16 DIAGNOSIS — N898 Other specified noninflammatory disorders of vagina: Secondary | ICD-10-CM | POA: Diagnosis not present

## 2022-02-16 DIAGNOSIS — R350 Frequency of micturition: Secondary | ICD-10-CM | POA: Diagnosis not present

## 2022-03-17 DIAGNOSIS — R1032 Left lower quadrant pain: Secondary | ICD-10-CM | POA: Diagnosis not present

## 2022-03-17 DIAGNOSIS — N3000 Acute cystitis without hematuria: Secondary | ICD-10-CM | POA: Diagnosis not present

## 2022-03-27 DIAGNOSIS — R1032 Left lower quadrant pain: Secondary | ICD-10-CM | POA: Diagnosis not present

## 2022-03-27 DIAGNOSIS — Z87440 Personal history of urinary (tract) infections: Secondary | ICD-10-CM | POA: Diagnosis not present

## 2022-04-07 DIAGNOSIS — N952 Postmenopausal atrophic vaginitis: Secondary | ICD-10-CM | POA: Diagnosis not present

## 2022-04-08 ENCOUNTER — Other Ambulatory Visit: Payer: Self-pay | Admitting: Hematology and Oncology

## 2022-06-14 ENCOUNTER — Other Ambulatory Visit: Payer: Self-pay | Admitting: *Deleted

## 2022-06-14 DIAGNOSIS — C50312 Malignant neoplasm of lower-inner quadrant of left female breast: Secondary | ICD-10-CM

## 2022-06-14 NOTE — Progress Notes (Signed)
Patient Care Team: Faustino Congress, NP as PCP - General (Family Medicine) Sueanne Margarita, MD as Consulting Physician (Cardiology) Irene Limbo, MD as Consulting Physician (Plastic Surgery) Erroll Luna, MD as Consulting Physician (General Surgery) Magrinat, Virgie Dad, MD (Inactive) as Consulting Physician (Oncology) Kyung Rudd, MD as Consulting Physician (Radiation Oncology) Janyth Pupa, DO as Consulting Physician (Obstetrics and Gynecology)  DIAGNOSIS: No diagnosis found.  SUMMARY OF ONCOLOGIC HISTORY: Oncology History  Malignant neoplasm of lower-inner quadrant of left breast in female, estrogen receptor positive ([place])  04/30/2019 Initial Diagnosis   Malignant neoplasm of lower-inner quadrant of left breast in female, estrogen receptor positive ([place])   04/30/2019 Cancer Staging   Staging form: Breast, AJCC 8th Edition - Clinical: Stage 0 (cTis (DCIS), cN0, cM0, ER+, PR+) - Signed by Gardenia Phlegm, NP on 04/30/2019   05/22/2019 Genetic Testing   Negative genetic testing. No pathogenic variants identified on the [facility] STAT Panel + Common Hereditary Cancers Panel. The STAT Breast cancer panel offered by [facility] includes sequencing and rearrangement analysis for the following 9 genes:  ATM, BRCA1, BRCA2, CDH1, CHEK2, PALB2, PTEN, STK11 and TP53.  The Common Hereditary Cancers Panel offered by [facility] includes sequencing and/or deletion duplication testing of the following 48 genes: APC, ATM, AXIN2, BARD1, BMPR1A, BRCA1, BRCA2, BRIP1, CDH1, CDKN2A (p14ARF), CDKN2A (p16INK4a), CKD4, CHEK2, CTNNA1, DICER1, EPCAM (Deletion/duplication testing only), GREM1 (promoter region deletion/duplication testing only), KIT, MEN1, MLH1, MSH2, MSH3, MSH6, MUTYH, NBN, NF1, NHTL1, PALB2, PDGFRA, PMS2, POLD1, POLE, PTEN, RAD50, RAD51C, RAD51D, RNF43, SDHB, SDHC, SDHD, SMAD4, SMARCA4. STK11, TP53, TSC1, TSC2, and VHL.  The following genes were evaluated for sequence changes only: SDHA and  HOXB13 c.251G>A variant only. The report date is 05/30/2019.    07/08/2019 Cancer Staging   Staging form: Breast, AJCC 8th Edition - Pathologic stage from 07/08/2019: Stage 0 (pTis (DCIS), pN0, cM0, ER+, PR+) - Signed by Gardenia Phlegm, NP on 11/24/2019   07/08/2019 Surgery   Left lumpectomy ([facility]) ([id number]): DCIS, grade 1, 0.2 cm. ER and PR positive. Negative margins. No lymph nodes examined.   08/19/2019 - 10/06/2019 Radiation Therapy   The patient initially received a dose of 42.56 Gy in 16 fractions to the breast using whole-breast tangent fields. This was delivered using a 3-D conformal technique. The pt received a boost delivering an additional 8 Gy in 4 fractions using a electron boost with 52mV electrons. The total dose was 50.56 Gy.   10/2019 - 10/2024 Anti-estrogen oral therapy   Tamoxifen     CHIEF COMPLIANT: Follow-up breast cancer surveillance on tamoxifen. Establish oncology care with Dr. GOval Linsey INTERVAL HISTORY: Kara [name] a 56y.o with the above mention breast cancer surveillance on tamoxifen. She presents to the clinic today for a follow-up.  ALLERGIES:  is allergic to imitrex [sumatriptan], hydrocil [psyllium], and other.  MEDICATIONS:  Current Outpatient Medications  Medication Sig Dispense Refill   amphetamine-dextroamphetamine (ADDERALL XR) 20 MG 24 hr capsule Take 1 capsule (20 mg total) by mouth daily. 30 capsule 0   Cholecalciferol (VITAMIN D3) 125 MCG (5000 UT) CAPS Take 5,000 capsules by mouth.     clindamycin (CLEOCIN T) 1 % lotion APPLY A SMALL AMOUNT TO SKIN TWICE A DAY AS NEEDED     eszopiclone (LUNESTA) 2 MG TABS tablet Take 1 tablet (2 mg total) by mouth at bedtime as needed for sleep. Take immediately before bedtime 30 tablet 0   hydrochlorothiazide (HYDRODIURIL) 25 MG tablet Take 25 mg by mouth  daily.     ibuprofen (ADVIL) 800 MG tablet Take 800 mg by mouth as needed.     Multiple Vitamins-Minerals (HAIR SKIN AND NAILS  FORMULA PO) Take as directed     potassium chloride (KLOR-CON) 10 MEQ tablet Take 1 tablet (10 mEq total) by mouth daily. 90 tablet 1   tamoxifen (NOLVADEX) 20 MG tablet Take 1 tablet (20 mg total) by mouth daily. 90 tablet 4   vitamin B-12 (CYANOCOBALAMIN) 500 MCG tablet Take 500 mcg by mouth daily.     No current facility-administered medications for this visit.    PHYSICAL EXAMINATION: ECOG PERFORMANCE STATUS: {CHL ONC ECOG [phone]}  There were no vitals filed for this visit. There were no vitals filed for this visit.  BREAST:*** No palpable masses or nodules in either right or left breasts. No palpable axillary supraclavicular or infraclavicular adenopathy no breast tenderness or nipple discharge. (exam performed in the presence of a chaperone)  LABORATORY DATA:  I have reviewed the data as listed    Latest Ref Rng & Units 06/17/2021    8:15 AM 05/31/2021    3:28 PM 12/06/2020    3:02 PM  CMP  Glucose 70 - 99 mg/dL 92  99  132   BUN 6 - 20 mg/dL _0 Creatinine 0.44 - 1.00 mg/dL 0.88  0.82  1.01   Sodium 135 - 145 mmol/L 142  140  139   Potassium 3.5 - 5.1 mmol/L 3.2  3.8  3.6   Chloride 98 - 111 mmol/L 103  105  103   CO2 22 - 32 mmol/L _1 Calcium 8.9 - 10.3 mg/dL 9.3  9.2  8.9   Total Protein 6.5 - 8.1 g/dL 7.7  7.9    Total Bilirubin 0.3 - 1.2 mg/dL 0.4  <0.2    Alkaline Phos 38 - 126 U/L 51  55    AST 15 - 41 U/L 16  16    ALT 0 - 44 U/L 13  15      Lab Results  Component Value Date   WBC 2.7 (L) 06/17/2021   HGB 11.7 (L) 06/17/2021   HCT 34.8 (L) 06/17/2021   MCV 90.9 06/17/2021   PLT 295 06/17/2021   NEUTROABS 1.9 06/17/2021    ASSESSMENT & PLAN:  No problem-specific Assessment & Plan notes found for this encounter.    No orders of the defined types were placed in this encounter.  The patient has a good understanding of the overall plan. she agrees with it. she will call with any problems that may develop before the next visit  here. Total time spent: 30 mins including face to face time and time spent for planning, charting and co-ordination of care   Suzzette Righter, [place] 06/14/22    I Gardiner Coins am scribing for Dr. Lindi Adie  ***

## 2022-06-15 ENCOUNTER — Inpatient Hospital Stay: Payer: BC Managed Care – PPO | Attending: Hematology and Oncology

## 2022-06-15 ENCOUNTER — Other Ambulatory Visit: Payer: Self-pay

## 2022-06-15 ENCOUNTER — Inpatient Hospital Stay: Payer: BC Managed Care – PPO | Admitting: Hematology and Oncology

## 2022-06-15 DIAGNOSIS — Z170 Estrogen receptor positive status [ER+]: Secondary | ICD-10-CM | POA: Insufficient documentation

## 2022-06-15 DIAGNOSIS — R635 Abnormal weight gain: Secondary | ICD-10-CM | POA: Insufficient documentation

## 2022-06-15 DIAGNOSIS — Z79899 Other long term (current) drug therapy: Secondary | ICD-10-CM | POA: Diagnosis not present

## 2022-06-15 DIAGNOSIS — C50312 Malignant neoplasm of lower-inner quadrant of left female breast: Secondary | ICD-10-CM

## 2022-06-15 DIAGNOSIS — Z79810 Long term (current) use of selective estrogen receptor modulators (SERMs): Secondary | ICD-10-CM | POA: Diagnosis not present

## 2022-06-15 LAB — CBC WITH DIFFERENTIAL (CANCER CENTER ONLY)
Abs Immature Granulocytes: 0.01 10*3/uL (ref 0.00–0.07)
Basophils Absolute: 0 10*3/uL (ref 0.0–0.1)
Basophils Relative: 0 %
Eosinophils Absolute: 0 10*3/uL (ref 0.0–0.5)
Eosinophils Relative: 1 %
HCT: 30.5 % — ABNORMAL LOW (ref 36.0–46.0)
Hemoglobin: 10.5 g/dL — ABNORMAL LOW (ref 12.0–15.0)
Immature Granulocytes: 0 %
Lymphocytes Relative: 28 %
Lymphs Abs: 1 10*3/uL (ref 0.7–4.0)
MCH: 30.7 pg (ref 26.0–34.0)
MCHC: 34.4 g/dL (ref 30.0–36.0)
MCV: 89.2 fL (ref 80.0–100.0)
Monocytes Absolute: 0.4 10*3/uL (ref 0.1–1.0)
Monocytes Relative: 11 %
Neutro Abs: 2.1 10*3/uL (ref 1.7–7.7)
Neutrophils Relative %: 60 %
Platelet Count: 388 10*3/uL (ref 150–400)
RBC: 3.42 MIL/uL — ABNORMAL LOW (ref 3.87–5.11)
RDW: 13.2 % (ref 11.5–15.5)
WBC Count: 3.5 10*3/uL — ABNORMAL LOW (ref 4.0–10.5)
nRBC: 0 % (ref 0.0–0.2)

## 2022-06-15 LAB — CMP (CANCER CENTER ONLY)
ALT: 15 U/L (ref 0–44)
AST: 17 U/L (ref 15–41)
Albumin: 3.9 g/dL (ref 3.5–5.0)
Alkaline Phosphatase: 42 U/L (ref 38–126)
Anion gap: 6 (ref 5–15)
BUN: 12 mg/dL (ref 6–20)
CO2: 32 mmol/L (ref 22–32)
Calcium: 9.3 mg/dL (ref 8.9–10.3)
Chloride: 104 mmol/L (ref 98–111)
Creatinine: 0.78 mg/dL (ref 0.44–1.00)
GFR, Estimated: >60 mL/min (ref >60)
Glucose, Bld: 93 mg/dL (ref 70–99)
Potassium: 3.6 mmol/L (ref 3.5–5.1)
Sodium: 142 mmol/L (ref 135–145)
Total Bilirubin: 0.4 mg/dL (ref 0.3–1.2)
Total Protein: 7.4 g/dL (ref 6.5–8.1)

## 2022-06-15 MED ORDER — WEGOVY 1 MG/0.5ML ~~LOC~~ SOAJ: 1.0000 mg | SUBCUTANEOUS | Status: AC

## 2022-06-15 MED ORDER — TAMOXIFEN CITRATE 20 MG PO TABS: 20.0000 mg | ORAL_TABLET | Freq: Every day | ORAL | 4 refills | Status: DC

## 2022-06-15 NOTE — Assessment & Plan Note (Addendum)
04/23/2019: Left breast biopsy: DCIS low-grade ER/PR positive 07/08/2019: Left lumpectomy: 0.2 cm DCIS, grade 1, margins negative status post bilateral mammoplasty 07/15/2019 08/18/2019-10/06/2019: Adjuvant radiation Current treatment: Tamoxifen started December 2020 with a plan to treat for 5 years Genetics: Negative  Tamoxifen toxicities: No adverse effects to tamoxifen therapy Weight gain: Due to menopause.  Breast cancer surveillance: 1.  Breast exam 06/15/2022: Benign 2. mammogram 04/22/2021: Benign breast density category B  Return to clinic in 1 year for follow-up

## 2022-07-04 ENCOUNTER — Other Ambulatory Visit: Payer: Self-pay | Admitting: Hematology and Oncology

## 2022-07-04 DIAGNOSIS — Z1231 Encounter for screening mammogram for malignant neoplasm of breast: Secondary | ICD-10-CM

## 2022-07-07 ENCOUNTER — Ambulatory Visit
Admission: RE | Admit: 2022-07-07 | Discharge: 2022-07-07 | Disposition: A | Discharge status: Home / Self Care | Payer: BC Managed Care – PPO | Source: Ambulatory Visit | Attending: Hematology and Oncology | Admitting: Hematology and Oncology

## 2022-07-07 DIAGNOSIS — E559 Vitamin D deficiency, unspecified: Secondary | ICD-10-CM | POA: Diagnosis not present

## 2022-07-07 DIAGNOSIS — Z1231 Encounter for screening mammogram for malignant neoplasm of breast: Secondary | ICD-10-CM | POA: Diagnosis not present

## 2022-07-07 DIAGNOSIS — I10 Essential (primary) hypertension: Secondary | ICD-10-CM | POA: Diagnosis not present

## 2022-07-07 DIAGNOSIS — Z0000 Encounter for general adult medical examination without abnormal findings: Secondary | ICD-10-CM | POA: Diagnosis not present

## 2022-07-07 DIAGNOSIS — Z23 Encounter for immunization: Secondary | ICD-10-CM | POA: Diagnosis not present

## 2022-08-18 DIAGNOSIS — Z79810 Long term (current) use of selective estrogen receptor modulators (SERMs): Secondary | ICD-10-CM | POA: Diagnosis not present

## 2022-08-18 DIAGNOSIS — D508 Other iron deficiency anemias: Secondary | ICD-10-CM | POA: Diagnosis not present

## 2022-08-18 DIAGNOSIS — D72818 Other decreased white blood cell count: Secondary | ICD-10-CM | POA: Diagnosis not present

## 2022-09-01 DIAGNOSIS — D508 Other iron deficiency anemias: Secondary | ICD-10-CM | POA: Diagnosis not present

## 2022-09-22 ENCOUNTER — Telehealth: Payer: Self-pay | Admitting: *Deleted

## 2022-09-22 NOTE — Telephone Encounter (Signed)
Received call from pt requesting to schedule MD visit with MD to review recent lab work from PCP office showing abnormal ferritin levels.  Pt states PCP office faxed labs last week.  At this time, we have not received lab work.  Pt states she will contact PCP and have them fax labs to our direct fax [phone]).  Pt states she will call back to make appt once lab work is received.

## 2022-09-26 ENCOUNTER — Telehealth: Payer: Self-pay | Admitting: *Deleted

## 2022-09-26 NOTE — Telephone Encounter (Signed)
Received recent lab work from PCP.  Pt called to schedule telephone visit with MD to review [place] of 711.  Appt scheduled, pt educated and verbalized understanding.

## 2022-09-27 ENCOUNTER — Telehealth: Payer: Self-pay | Admitting: Hematology and Oncology

## 2022-09-27 ENCOUNTER — Inpatient Hospital Stay: Payer: BC Managed Care – PPO | Attending: Hematology and Oncology | Admitting: Hematology and Oncology

## 2022-09-27 DIAGNOSIS — Z79899 Other long term (current) drug therapy: Secondary | ICD-10-CM | POA: Insufficient documentation

## 2022-09-27 DIAGNOSIS — Z170 Estrogen receptor positive status [ER+]: Secondary | ICD-10-CM | POA: Insufficient documentation

## 2022-09-27 DIAGNOSIS — R7989 Other specified abnormal findings of blood chemistry: Secondary | ICD-10-CM | POA: Diagnosis not present

## 2022-09-27 DIAGNOSIS — C50312 Malignant neoplasm of lower-inner quadrant of left female breast: Secondary | ICD-10-CM | POA: Insufficient documentation

## 2022-09-27 DIAGNOSIS — Z79810 Long term (current) use of selective estrogen receptor modulators (SERMs): Secondary | ICD-10-CM | POA: Insufficient documentation

## 2022-09-27 NOTE — Progress Notes (Signed)
MyChart virtual visit  Patient Care Team: Faustino Congress, NP as PCP - General (Family Medicine) Sueanne Margarita, MD as Consulting Physician (Cardiology) Irene Limbo, MD as Consulting Physician (Plastic Surgery) Erroll Luna, MD as Consulting Physician (General Surgery) Magrinat, Virgie Dad, MD (Inactive) as Consulting Physician (Oncology) Kyung Rudd, MD as Consulting Physician (Radiation Oncology) Janyth Pupa, DO as Consulting Physician (Obstetrics and Gynecology)  DIAGNOSIS:  Encounter Diagnoses  Name Primary?   Malignant neoplasm of lower-inner quadrant of left breast in female, estrogen receptor positive ([place]) Yes   Elevated ferritin     SUMMARY OF ONCOLOGIC HISTORY: Oncology History  Malignant neoplasm of lower-inner quadrant of left breast in female, estrogen receptor positive ([place])  04/30/2019 Initial Diagnosis   Malignant neoplasm of lower-inner quadrant of left breast in female, estrogen receptor positive ([place])   04/30/2019 Cancer Staging   Staging form: Breast, AJCC 8th Edition - Clinical: Stage 0 (cTis (DCIS), cN0, cM0, ER+, PR+) - Signed by Gardenia Phlegm, NP on 04/30/2019   05/22/2019 Genetic Testing   Negative genetic testing. No pathogenic variants identified on the [facility] STAT Panel + Common Hereditary Cancers Panel. The STAT Breast cancer panel offered by [facility] includes sequencing and rearrangement analysis for the following 9 genes:  ATM, BRCA1, BRCA2, CDH1, CHEK2, PALB2, PTEN, STK11 and TP53.  The Common Hereditary Cancers Panel offered by [facility] includes sequencing and/or deletion duplication testing of the following 48 genes: APC, ATM, AXIN2, BARD1, BMPR1A, BRCA1, BRCA2, BRIP1, CDH1, CDKN2A (p14ARF), CDKN2A (p16INK4a), CKD4, CHEK2, CTNNA1, DICER1, EPCAM (Deletion/duplication testing only), GREM1 (promoter region deletion/duplication testing only), KIT, MEN1, MLH1, MSH2, MSH3, MSH6, MUTYH, NBN, NF1, NHTL1, PALB2, PDGFRA, PMS2, POLD1, POLE,  PTEN, RAD50, RAD51C, RAD51D, RNF43, SDHB, SDHC, SDHD, SMAD4, SMARCA4. STK11, TP53, TSC1, TSC2, and VHL.  The following genes were evaluated for sequence changes only: SDHA and HOXB13 c.251G>A variant only. The report date is 05/30/2019.    07/08/2019 Cancer Staging   Staging form: Breast, AJCC 8th Edition - Pathologic stage from 07/08/2019: Stage 0 (pTis (DCIS), pN0, cM0, ER+, PR+) - Signed by Gardenia Phlegm, NP on 11/24/2019   07/08/2019 Surgery   Left lumpectomy ([facility]) ([id number]): DCIS, grade 1, 0.2 cm. ER and PR positive. Negative margins. No lymph nodes examined.   08/19/2019 - 10/06/2019 Radiation Therapy   The patient initially received a dose of 42.56 Gy in 16 fractions to the breast using whole-breast tangent fields. This was delivered using a 3-D conformal technique. The pt received a boost delivering an additional 8 Gy in 4 fractions using a electron boost with 96mV electrons. The total dose was 50.56 Gy.   10/2019 - 10/2024 Anti-estrogen oral therapy   Tamoxifen     CHIEF COMPLIANT: Elevated ferritin  INTERVAL HISTORY: Kara Bohlenis a [age]year old with above-mentioned history of breast cancer who is currently on tamoxifen and appears to be tolerating it very well.  She was found on routine blood work to have mild anemia hemoglobin was 11.3 and iron studies revealed that the ferritin was 711 (it used to be 644 08/18/2022 and it used to be 422 in September 2022.)  Because of the elevated ferritin she called and wanted to make sure we discussed the causes of elevated ferritin.  [facility] and that she has been doing quite well with that.   ALLERGIES:  is allergic to imitrex [sumatriptan], hydrocil [psyllium], and other.  MEDICATIONS:  Current Outpatient Medications  Medication Sig Dispense Refill  amphetamine-dextroamphetamine (ADDERALL XR) 20 MG 24 hr capsule Take 1 capsule (20 mg total) by mouth daily. 30 capsule 0    Cholecalciferol (VITAMIN D3) 125 MCG (5000 UT) CAPS Take 5,000 capsules by mouth.     clindamycin (CLEOCIN T) 1 % lotion APPLY A SMALL AMOUNT TO SKIN TWICE A DAY AS NEEDED     eszopiclone (LUNESTA) 2 MG TABS tablet Take 1 tablet (2 mg total) by mouth at bedtime as needed for sleep. Take immediately before bedtime 30 tablet 0   hydrochlorothiazide (HYDRODIURIL) 25 MG tablet Take 25 mg by mouth daily.     ibuprofen (ADVIL) 800 MG tablet Take 800 mg by mouth as needed.     Multiple Vitamins-Minerals (HAIR SKIN AND NAILS FORMULA PO) Take as directed     potassium chloride (KLOR-CON) 10 MEQ tablet Take 1 tablet (10 mEq total) by mouth daily. 90 tablet 1   Semaglutide-Weight Management (WEGOVY) 1 MG/0.5ML SOAJ Inject 1 mg into the skin once a week. 2 mL    tamoxifen (NOLVADEX) 20 MG tablet Take 1 tablet (20 mg total) by mouth daily. 90 tablet 4   vitamin B-12 (CYANOCOBALAMIN) 500 MCG tablet Take 500 mcg by mouth daily.     No current facility-administered medications for this visit.    PHYSICAL EXAMINATION: ECOG PERFORMANCE STATUS: 1 - Symptomatic but completely ambulatory  There were no vitals filed for this visit. There were no vitals filed for this visit.    LABORATORY DATA:  I have reviewed the data as listed    Latest Ref Rng & Units 06/15/2022    8:21 AM 06/17/2021    8:15 AM 05/31/2021    3:28 PM  CMP  Glucose 70 - 99 mg/dL 93  92  99   BUN 6 - 20 mg/dL _0 Creatinine 0.44 - 1.00 mg/dL 0.78  0.88  0.82   Sodium 135 - 145 mmol/L 142  142  140   Potassium 3.5 - 5.1 mmol/L 3.6  3.2  3.8   Chloride 98 - 111 mmol/L 104  103  105   CO2 22 - 32 mmol/L 32  29  27   Calcium 8.9 - 10.3 mg/dL 9.3  9.3  9.2   Total Protein 6.5 - 8.1 g/dL 7.4  7.7  7.9   Total Bilirubin 0.3 - 1.2 mg/dL 0.4  0.4  <0.2   Alkaline Phos 38 - 126 U/L 42  51  55   AST 15 - 41 U/L _1 ALT 0 - 44 U/L _2 Lab Results  Component Value Date   WBC 3.5 (L) 06/15/2022   HGB 10.5 (L)  06/15/2022   HCT 30.5 (L) 06/15/2022   MCV 89.2 06/15/2022   PLT 388 06/15/2022   NEUTROABS 2.1 06/15/2022    ASSESSMENT & PLAN:  Malignant neoplasm of lower-inner quadrant of left breast in female, estrogen receptor positive ([place]) 04/23/2019: Left breast biopsy: DCIS low-grade ER/PR positive 07/08/2019: Left lumpectomy: 0.2 cm DCIS, grade 1, margins negative status post bilateral mammoplasty 07/15/2019 08/18/2019-10/06/2019: Adjuvant radiation Current treatment: Tamoxifen started December 2020 with a plan to treat for 5 years Genetics: Negative   Tamoxifen toxicities: No adverse effects to tamoxifen therapy Weight gain: Due to menopause.   Breast cancer surveillance: 1.  Breast exam 06/15/2022: Benign 2. mammogram 06/20/2022: Benign breast density category B ----------------------------- Elevated ferritin: I discussed with the patient that because iron  saturation was normal at 22% I do not think this is a sign of iron overload.  She was started on oral iron therapy which I encouraged her to stop taking it at this time.  I believe that the cause of elevated ferritin is underlying inflammation and because it is an acute phase reactant it can go up.  There is no clear-cut sign of inflammation in the body but it certainly looks that way that elevated ferritin suggesting that.  She will repeat her ferritin levels in 3 months.  In the meantime I instructed her to use turmeric as an anti-inflammatory medication.  No orders of the defined types were placed in this encounter.  The patient has a good understanding of the overall plan. she agrees with it. she will call with any problems that may develop before the next visit here. Total time spent: 30 mins including face to face time and time spent for planning, charting and co-ordination of care   Harriette Ohara, MD 09/27/22

## 2022-09-27 NOTE — Telephone Encounter (Signed)
Patient called to confirm appointments. Forwarded information to RN for clarification.

## 2022-09-27 NOTE — Assessment & Plan Note (Addendum)
04/23/2019: Left breast biopsy: DCIS low-grade ER/PR positive 07/08/2019: Left lumpectomy: 0.2 cm DCIS, grade 1, margins negative status post bilateral mammoplasty 07/15/2019 08/18/2019-10/06/2019: Adjuvant radiation Current treatment: Tamoxifen started December 2020 with a plan to treat for 5 years Genetics: Negative   Tamoxifen toxicities: No adverse effects to tamoxifen therapy Weight gain: Due to menopause.   Breast cancer surveillance: 1.  Breast exam 06/15/2022: Benign 2. mammogram 06/20/2022: Benign breast density category B

## 2022-10-02 ENCOUNTER — Encounter: Payer: Self-pay | Admitting: Hematology and Oncology

## 2023-02-27 DIAGNOSIS — E559 Vitamin D deficiency, unspecified: Secondary | ICD-10-CM | POA: Diagnosis not present

## 2023-02-27 DIAGNOSIS — F324 Major depressive disorder, single episode, in partial remission: Secondary | ICD-10-CM | POA: Diagnosis not present

## 2023-02-27 DIAGNOSIS — I10 Essential (primary) hypertension: Secondary | ICD-10-CM | POA: Diagnosis not present

## 2023-02-27 DIAGNOSIS — D508 Other iron deficiency anemias: Secondary | ICD-10-CM | POA: Diagnosis not present

## 2023-03-02 ENCOUNTER — Telehealth: Payer: Self-pay | Admitting: Hematology and Oncology

## 2023-03-02 NOTE — Telephone Encounter (Signed)
Scheduled appointment per scheduling message. Left voicemail.  

## 2023-04-03 ENCOUNTER — Other Ambulatory Visit: Payer: Self-pay | Admitting: Hematology and Oncology

## 2023-04-03 ENCOUNTER — Inpatient Hospital Stay: Payer: BC Managed Care – PPO | Attending: Hematology and Oncology | Admitting: Hematology and Oncology

## 2023-04-03 DIAGNOSIS — R7989 Other specified abnormal findings of blood chemistry: Secondary | ICD-10-CM

## 2023-04-03 NOTE — Assessment & Plan Note (Deleted)
04/23/2019: Left breast biopsy: DCIS low-grade ER/PR positive 07/08/2019: Left lumpectomy: 0.2 cm DCIS, grade 1, margins negative status post bilateral mammoplasty 07/15/2019 08/18/2019-10/06/2019: Adjuvant radiation Current treatment: Tamoxifen started December 2020 with a plan to treat for 5 years Genetics: Negative   Tamoxifen toxicities: No adverse effects to tamoxifen therapy Weight gain: Due to menopause.   Breast cancer surveillance: 1.  Breast exam 06/15/2022: Benign 2. mammogram 06/20/2022: Benign breast density category B ----------------------------- Elevated ferritin: I discussed with the patient that because iron saturation was normal at 22% I do not think this is a sign of iron overload.  Previously discontinued oral iron.  Because of elevated ferritin is most likely inflammation

## 2023-06-21 ENCOUNTER — Inpatient Hospital Stay: Payer: BC Managed Care – PPO | Attending: Hematology and Oncology | Admitting: Hematology and Oncology

## 2023-06-21 VITALS — BP 130/74 | HR 87 | Temp 97.2°F | Resp 18 | Ht 64.5 in | Wt 159.0 lb

## 2023-06-21 DIAGNOSIS — Z79899 Other long term (current) drug therapy: Secondary | ICD-10-CM | POA: Diagnosis not present

## 2023-06-21 DIAGNOSIS — Z79810 Long term (current) use of selective estrogen receptor modulators (SERMs): Secondary | ICD-10-CM | POA: Insufficient documentation

## 2023-06-21 DIAGNOSIS — R5383 Other fatigue: Secondary | ICD-10-CM | POA: Diagnosis not present

## 2023-06-21 DIAGNOSIS — R7989 Other specified abnormal findings of blood chemistry: Secondary | ICD-10-CM | POA: Insufficient documentation

## 2023-06-21 DIAGNOSIS — Z170 Estrogen receptor positive status [ER+]: Secondary | ICD-10-CM | POA: Diagnosis not present

## 2023-06-21 DIAGNOSIS — C50312 Malignant neoplasm of lower-inner quadrant of left female breast: Secondary | ICD-10-CM | POA: Diagnosis not present

## 2023-06-21 MED ORDER — TAMOXIFEN CITRATE 10 MG PO TABS: 10.0000 mg | ORAL_TABLET | Freq: Every day | ORAL | 3 refills | Status: DC

## 2023-06-21 NOTE — Progress Notes (Signed)
Patient Care Team: Moshe Cipro, FNP as PCP - General (Family Medicine) Quintella Reichert, MD as Consulting Physician (Cardiology) Glenna Fellows, MD as Consulting Physician (Plastic Surgery) Harriette Bouillon, MD as Consulting Physician (General Surgery) Magrinat, Valentino Hue, MD (Inactive) as Consulting Physician (Oncology) Dorothy Puffer, MD as Consulting Physician (Radiation Oncology) Myna Hidalgo, DO as Consulting Physician (Obstetrics and Gynecology)  DIAGNOSIS:  Encounter Diagnoses  Name Primary?   Malignant neoplasm of lower-inner quadrant of left breast in female, estrogen receptor positive ([facility]) Yes   Elevated ferritin     SUMMARY OF ONCOLOGIC HISTORY: Oncology History  Malignant neoplasm of lower-inner quadrant of left breast in female, estrogen receptor positive ([facility])  04/30/2019 Initial Diagnosis   Malignant neoplasm of lower-inner quadrant of left breast in female, estrogen receptor positive ([facility])   04/30/2019 Cancer Staging   Staging form: Breast, AJCC 8th Edition - Clinical: Stage 0 (cTis (DCIS), cN0, cM0, ER+, PR+) - Signed by Loa Socks, NP on 04/30/2019   05/22/2019 Genetic Testing   Negative genetic testing. No pathogenic variants identified on the [facility] STAT Panel + Common Hereditary Cancers Panel. The STAT Breast cancer panel offered by [facility] includes sequencing and rearrangement analysis for the following 9 genes:  ATM, BRCA1, BRCA2, CDH1, CHEK2, PALB2, PTEN, STK11 and TP53.  The Common Hereditary Cancers Panel offered by [facility] includes sequencing and/or deletion duplication testing of the following 48 genes: APC, ATM, AXIN2, BARD1, BMPR1A, BRCA1, BRCA2, BRIP1, CDH1, CDKN2A (p14ARF), CDKN2A (p16INK4a), CKD4, CHEK2, CTNNA1, DICER1, EPCAM (Deletion/duplication testing only), GREM1 (promoter region deletion/duplication testing only), KIT, MEN1, MLH1, MSH2, MSH3, MSH6, MUTYH, NBN, NF1, NHTL1, PALB2, PDGFRA, PMS2, POLD1, POLE, PTEN, RAD50, RAD51C,  RAD51D, RNF43, SDHB, SDHC, SDHD, SMAD4, SMARCA4. STK11, TP53, TSC1, TSC2, and VHL.  The following genes were evaluated for sequence changes only: SDHA and HOXB13 c.251G>A variant only. The report date is 05/30/2019.    07/08/2019 Cancer Staging   Staging form: Breast, AJCC 8th Edition - Pathologic stage from 07/08/2019: Stage 0 (pTis (DCIS), pN0, cM0, ER+, PR+) - Signed by Loa Socks, NP on 11/24/2019   07/08/2019 Surgery   Left lumpectomy ([facility]) ([id number]): DCIS, grade 1, 0.2 cm. ER and PR positive. Negative margins. No lymph nodes examined.   08/19/2019 - 10/06/2019 Radiation Therapy   The patient initially received a dose of 42.56 Gy in 16 fractions to the breast using whole-breast tangent fields. This was delivered using a 3-D conformal technique. The pt received a boost delivering an additional 8 Gy in 4 fractions using a electron boost with electrons. The total dose was 50.56 Gy.   10/2019 - 10/2024 Anti-estrogen oral therapy   Tamoxifen     CHIEF COMPLIANT: Tamoxifen  INTERVAL HISTORY: Kara [name] is a Patient reports that she has been more tired. She has lost at least 45 pounds taking the [facility].   ALLERGIES:  is allergic to imitrex [sumatriptan], hydrocil [psyllium], and other.  MEDICATIONS:  Current Outpatient Medications  Medication Sig Dispense Refill   amphetamine-dextroamphetamine (ADDERALL XR) 20 MG 24 hr capsule Take 1 capsule (20 mg total) by mouth daily. 30 capsule 0   Cholecalciferol (VITAMIN D3) 125 MCG (5000 UT) CAPS Take 5,000 capsules by mouth.     clindamycin (CLEOCIN T) 1 % lotion APPLY A SMALL AMOUNT TO SKIN TWICE A DAY AS NEEDED     eszopiclone (LUNESTA) 2 MG TABS tablet Take 1 tablet (2 mg total) by mouth at bedtime as needed for sleep. Take immediately before bedtime  30 tablet 0   hydrochlorothiazide (HYDRODIURIL) 25 MG tablet Take 25 mg by mouth daily.     ibuprofen (ADVIL) 800 MG tablet Take 800 mg by mouth as needed.      Multiple Vitamins-Minerals (HAIR SKIN AND NAILS FORMULA PO) Take as directed     potassium chloride (KLOR-CON) 10 MEQ tablet Take 1 tablet (10 mEq total) by mouth daily. 90 tablet 1   Semaglutide-Weight Management (WEGOVY) 1 MG/0.5ML SOAJ Inject 1 mg into the skin once a week. 2 mL    tamoxifen (NOLVADEX) 10 MG tablet Take 1 tablet (10 mg total) by mouth daily. 90 tablet 3   vitamin B-12 (CYANOCOBALAMIN) 500 MCG tablet Take 500 mcg by mouth daily.     No current facility-administered medications for this visit.    PHYSICAL EXAMINATION: ECOG PERFORMANCE STATUS: 1 - Symptomatic but completely ambulatory  Vitals:   06/21/23 0843  BP: 130/74  Pulse: 87  Resp: 18  Temp: (!) 97.2 F (36.2 C)  SpO2: 99%   Filed Weights   06/21/23 0843  Weight: 159 lb (72.1 kg)    BREAST: No palpable masses or nodules in either right or left breasts. No palpable axillary supraclavicular or infraclavicular adenopathy no breast tenderness or nipple discharge. (exam performed in the presence of a chaperone)  LABORATORY DATA:  I have reviewed the data as listed    Latest Ref Rng & Units 06/15/2022    8:21 AM 06/17/2021    8:15 AM 05/31/2021    3:28 PM  CMP  Glucose 70 - 99 mg/dL 93  92  99   BUN 6 - 20 mg/dL 12  12  12    Creatinine 0.44 - 1.00 mg/dL 5.46  5.68  1.27   Sodium 135 - 145 mmol/L 142  142  140   Potassium 3.5 - 5.1 mmol/L 3.6  3.2  3.8   Chloride 98 - 111 mmol/L 104  103  105   CO2 22 - 32 mmol/L 32  29  27   Calcium 8.9 - 10.3 mg/dL 9.3  9.3  9.2   Total Protein 6.5 - 8.1 g/dL 7.4  7.7  7.9   Total Bilirubin 0.3 - 1.2 mg/dL 0.4  0.4  <5.1   Alkaline Phos 38 - 126 U/L 42  51  55   AST 15 - 41 U/L 17  16  16    ALT 0 - 44 U/L 15  13  15      Lab Results  Component Value Date   WBC 3.5 (L) 06/15/2022   HGB 10.5 (L) 06/15/2022   HCT 30.5 (L) 06/15/2022   MCV 89.2 06/15/2022   PLT 388 06/15/2022   NEUTROABS 2.1 06/15/2022    ASSESSMENT & PLAN:  Malignant neoplasm of lower-inner  quadrant of left breast in female, estrogen receptor positive ([facility]) 04/23/2019: Left breast biopsy: DCIS low-grade ER/PR positive 07/08/2019: Left lumpectomy: 0.2 cm DCIS, grade 1, margins negative status post bilateral mammoplasty 07/15/2019 08/18/2019-10/06/2019: Adjuvant radiation Current treatment: Tamoxifen started December 2020 with a plan to treat for 5 years Genetics: Negative   Tamoxifen toxicities: Fatigue: I recommend that we reduce the dose of tamoxifen to 10 mg a day     Breast cancer surveillance: 1.  Breast exam 06/21/2023: Benign 2. mammogram 07/10/2022: Benign breast density category B  Return to clinic in 1 year for follow-up  Elevated ferritin Lab review: 06/15/2022: Hemoglobin 10.5 08/18/2022: Ferritin 644, iron saturation 22%, TIBC 242 hemoglobin 11.3 09/01/2022: Ferritin 711 Most likely cause  of elevated ferritin: Inflammation    No orders of the defined types were placed in this encounter.  The patient has a good understanding of the overall plan. she agrees with it. she will call with any problems that may develop before the next visit here. Total time spent: 30 mins including face to face time and time spent for planning, charting and co-ordination of care   Tamsen Meek, MD 06/21/23    I Janan Ridge am acting as a Neurosurgeon for [employer]  I have reviewed the above documentation for accuracy and completeness, and I agree with the above.

## 2023-06-21 NOTE — Assessment & Plan Note (Signed)
Lab review: 06/15/2022: Hemoglobin 10.5 08/18/2022: Ferritin 644, iron saturation 22%, TIBC 242 hemoglobin 11.3 09/01/2022: Ferritin 711 Most likely cause of elevated ferritin: Inflammation

## 2023-06-21 NOTE — Assessment & Plan Note (Addendum)
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  Breast exam 06/21/2023: Benign 2. mammogram 07/10/2022: Benign breast density category B  Return to clinic in 1 year for follow-up

## 2023-07-09 ENCOUNTER — Other Ambulatory Visit: Payer: Self-pay | Admitting: Hematology and Oncology

## 2023-07-09 DIAGNOSIS — Z0000 Encounter for general adult medical examination without abnormal findings: Secondary | ICD-10-CM | POA: Diagnosis not present

## 2023-07-09 DIAGNOSIS — E663 Overweight: Secondary | ICD-10-CM | POA: Diagnosis not present

## 2023-07-09 DIAGNOSIS — Z1231 Encounter for screening mammogram for malignant neoplasm of breast: Secondary | ICD-10-CM

## 2023-07-09 DIAGNOSIS — I10 Essential (primary) hypertension: Secondary | ICD-10-CM | POA: Diagnosis not present

## 2023-07-09 DIAGNOSIS — D72819 Decreased white blood cell count, unspecified: Secondary | ICD-10-CM | POA: Diagnosis not present

## 2023-07-09 DIAGNOSIS — E782 Mixed hyperlipidemia: Secondary | ICD-10-CM | POA: Diagnosis not present

## 2023-07-09 DIAGNOSIS — E559 Vitamin D deficiency, unspecified: Secondary | ICD-10-CM | POA: Diagnosis not present

## 2023-07-09 DIAGNOSIS — Z23 Encounter for immunization: Secondary | ICD-10-CM | POA: Diagnosis not present

## 2023-07-09 DIAGNOSIS — M85852 Other specified disorders of bone density and structure, left thigh: Secondary | ICD-10-CM | POA: Diagnosis not present

## 2023-07-10 ENCOUNTER — Other Ambulatory Visit: Payer: Self-pay | Admitting: Family Medicine

## 2023-07-10 ENCOUNTER — Telehealth: Payer: Self-pay

## 2023-07-10 DIAGNOSIS — M85852 Other specified disorders of bone density and structure, left thigh: Secondary | ICD-10-CM

## 2023-07-10 NOTE — Telephone Encounter (Signed)
Attempted to return call from Patient who left a voicemail inquiring about paperwork that she left with Provider. Left voicemail for Patient to return call to this Nurse, as no forms have been received in this office as of this date.

## 2023-07-18 ENCOUNTER — Ambulatory Visit
Admission: RE | Admit: 2023-07-18 | Discharge: 2023-07-18 | Disposition: A | Discharge status: Home / Self Care | Payer: BC Managed Care – PPO | Source: Ambulatory Visit | Attending: Hematology and Oncology | Admitting: Hematology and Oncology

## 2023-07-18 DIAGNOSIS — Z1231 Encounter for screening mammogram for malignant neoplasm of breast: Secondary | ICD-10-CM

## 2023-08-16 DIAGNOSIS — Z9889 Other specified postprocedural states: Secondary | ICD-10-CM | POA: Diagnosis not present

## 2023-08-16 DIAGNOSIS — Z853 Personal history of malignant neoplasm of breast: Secondary | ICD-10-CM | POA: Diagnosis not present

## 2023-08-16 DIAGNOSIS — Z113 Encounter for screening for infections with a predominantly sexual mode of transmission: Secondary | ICD-10-CM | POA: Diagnosis not present

## 2023-08-16 DIAGNOSIS — Z114 Encounter for screening for human immunodeficiency virus [HIV]: Secondary | ICD-10-CM | POA: Diagnosis not present

## 2023-08-16 DIAGNOSIS — Z01419 Encounter for gynecological examination (general) (routine) without abnormal findings: Secondary | ICD-10-CM | POA: Diagnosis not present

## 2023-08-16 DIAGNOSIS — F419 Anxiety disorder, unspecified: Secondary | ICD-10-CM | POA: Diagnosis not present

## 2023-08-16 DIAGNOSIS — Z1159 Encounter for screening for other viral diseases: Secondary | ICD-10-CM | POA: Diagnosis not present

## 2023-09-15 ENCOUNTER — Other Ambulatory Visit: Payer: Self-pay | Admitting: Hematology and Oncology

## 2023-09-17 NOTE — Telephone Encounter (Signed)
Continue until Dec 2025. Lorayne Marek, RN

## 2024-01-04 DIAGNOSIS — K148 Other diseases of tongue: Secondary | ICD-10-CM | POA: Diagnosis not present

## 2024-01-04 DIAGNOSIS — F411 Generalized anxiety disorder: Secondary | ICD-10-CM | POA: Diagnosis not present

## 2024-01-04 DIAGNOSIS — I10 Essential (primary) hypertension: Secondary | ICD-10-CM | POA: Diagnosis not present

## 2024-01-04 DIAGNOSIS — Z1211 Encounter for screening for malignant neoplasm of colon: Secondary | ICD-10-CM | POA: Diagnosis not present

## 2024-02-07 ENCOUNTER — Ambulatory Visit
Admission: RE | Admit: 2024-02-07 | Discharge: 2024-02-07 | Disposition: A | Discharge status: Home / Self Care | Payer: BC Managed Care – PPO | Source: Ambulatory Visit | Attending: Family Medicine | Admitting: Family Medicine

## 2024-02-07 DIAGNOSIS — M8588 Other specified disorders of bone density and structure, other site: Secondary | ICD-10-CM | POA: Diagnosis not present

## 2024-02-07 DIAGNOSIS — N958 Other specified menopausal and perimenopausal disorders: Secondary | ICD-10-CM | POA: Diagnosis not present

## 2024-02-07 DIAGNOSIS — M85852 Other specified disorders of bone density and structure, left thigh: Secondary | ICD-10-CM

## 2024-04-30 ENCOUNTER — Other Ambulatory Visit: Payer: Self-pay | Admitting: Hematology and Oncology

## 2024-05-16 DIAGNOSIS — Z124 Encounter for screening for malignant neoplasm of cervix: Secondary | ICD-10-CM | POA: Diagnosis not present

## 2024-05-16 DIAGNOSIS — R6882 Decreased libido: Secondary | ICD-10-CM | POA: Diagnosis not present

## 2024-05-16 DIAGNOSIS — Z01411 Encounter for gynecological examination (general) (routine) with abnormal findings: Secondary | ICD-10-CM | POA: Diagnosis not present

## 2024-05-16 DIAGNOSIS — Z113 Encounter for screening for infections with a predominantly sexual mode of transmission: Secondary | ICD-10-CM | POA: Diagnosis not present

## 2024-05-16 DIAGNOSIS — N6459 Other signs and symptoms in breast: Secondary | ICD-10-CM | POA: Diagnosis not present

## 2024-05-16 DIAGNOSIS — B3731 Acute candidiasis of vulva and vagina: Secondary | ICD-10-CM | POA: Diagnosis not present

## 2024-05-21 ENCOUNTER — Other Ambulatory Visit: Payer: Self-pay | Admitting: Obstetrics & Gynecology

## 2024-05-21 DIAGNOSIS — N6459 Other signs and symptoms in breast: Secondary | ICD-10-CM

## 2024-06-23 ENCOUNTER — Inpatient Hospital Stay: Payer: BC Managed Care – PPO | Attending: Hematology and Oncology | Admitting: Hematology and Oncology

## 2024-06-23 VITALS — BP 118/70 | HR 68 | Temp 97.5°F | Resp 18 | Ht 65.0 in | Wt 159.8 lb

## 2024-06-23 DIAGNOSIS — D649 Anemia, unspecified: Secondary | ICD-10-CM | POA: Diagnosis not present

## 2024-06-23 DIAGNOSIS — Z79899 Other long term (current) drug therapy: Secondary | ICD-10-CM | POA: Insufficient documentation

## 2024-06-23 DIAGNOSIS — C50312 Malignant neoplasm of lower-inner quadrant of left female breast: Secondary | ICD-10-CM | POA: Diagnosis not present

## 2024-06-23 DIAGNOSIS — R7989 Other specified abnormal findings of blood chemistry: Secondary | ICD-10-CM | POA: Diagnosis not present

## 2024-06-23 DIAGNOSIS — Z170 Estrogen receptor positive status [ER+]: Secondary | ICD-10-CM | POA: Diagnosis not present

## 2024-06-23 DIAGNOSIS — M8580 Other specified disorders of bone density and structure, unspecified site: Secondary | ICD-10-CM | POA: Insufficient documentation

## 2024-06-23 DIAGNOSIS — Z79810 Long term (current) use of selective estrogen receptor modulators (SERMs): Secondary | ICD-10-CM | POA: Insufficient documentation

## 2024-06-23 DIAGNOSIS — R5383 Other fatigue: Secondary | ICD-10-CM | POA: Diagnosis not present

## 2024-06-23 DIAGNOSIS — Z1721 Progesterone receptor positive status: Secondary | ICD-10-CM | POA: Insufficient documentation

## 2024-06-23 NOTE — Progress Notes (Signed)
 Patient Care Team: Claudene Lacks, MD as PCP - General (Family Medicine) Shlomo Wilbert SAUNDERS, MD as Consulting Physician (Cardiology) Arelia Filippo, MD as Consulting Physician (Plastic Surgery) Vanderbilt Ned, MD as Consulting Physician (General Surgery) Dewey Rush, MD as Consulting Physician (Radiation Oncology) Marilynn Nest, DO as Consulting Physician (Obstetrics and Gynecology) Odean Potts, MD as Consulting Physician (Hematology and Oncology)  DIAGNOSIS:  Encounter Diagnoses  Name Primary?   Malignant neoplasm of lower-inner quadrant of left breast in female, estrogen receptor positive (HCC) Yes   Elevated ferritin     SUMMARY OF ONCOLOGIC HISTORY: Oncology History  Malignant neoplasm of lower-inner quadrant of left breast in female, estrogen receptor positive (HCC)  04/30/2019 Initial Diagnosis   Malignant neoplasm of lower-inner quadrant of left breast in female, estrogen receptor positive (HCC)   04/30/2019 Cancer Staging   Staging form: Breast, AJCC 8th Edition - Clinical: Stage 0 (cTis (DCIS), cN0, cM0, ER+, PR+) - Signed by Crawford Morna Pickle, NP on 04/30/2019   05/22/2019 Genetic Testing   Negative genetic testing. No pathogenic variants identified on the [facility] STAT Panel + Common Hereditary Cancers Panel. The STAT Breast cancer panel offered by [facility] includes sequencing and rearrangement analysis for the following 9 genes:  ATM, BRCA1, BRCA2, CDH1, CHEK2, PALB2, PTEN, STK11 and TP53.  The Common Hereditary Cancers Panel offered by [facility] includes sequencing and/or deletion duplication testing of the following 48 genes: APC, ATM, AXIN2, BARD1, BMPR1A, BRCA1, BRCA2, BRIP1, CDH1, CDKN2A (p14ARF), CDKN2A (p16INK4a), CKD4, CHEK2, CTNNA1, DICER1, EPCAM (Deletion/duplication testing only), GREM1 (promoter region deletion/duplication testing only), KIT, MEN1, MLH1, MSH2, MSH3, MSH6, MUTYH, NBN, NF1, NHTL1, PALB2, PDGFRA, PMS2, POLD1, POLE, PTEN, RAD50, RAD51C, RAD51D,  RNF43, SDHB, SDHC, SDHD, SMAD4, SMARCA4. STK11, TP53, TSC1, TSC2, and VHL.  The following genes were evaluated for sequence changes only: SDHA and HOXB13 c.251G>A variant only. The report date is 05/30/2019.    07/08/2019 Cancer Staging   Staging form: Breast, AJCC 8th Edition - Pathologic stage from 07/08/2019: Stage 0 (pTis (DCIS), pN0, cM0, ER+, PR+) - Signed by Crawford Morna Pickle, NP on 11/24/2019   07/08/2019 Surgery   Left lumpectomy ([facility]) ([id number]): DCIS, grade 1, 0.2 cm. ER and PR positive. Negative margins. No lymph nodes examined.   08/19/2019 - 10/06/2019 Radiation Therapy   The patient initially received a dose of 42.56 Gy in 16 fractions to the breast using whole-breast tangent fields. This was delivered using a 3-D conformal technique. The pt received a boost delivering an additional 8 Gy in 4 fractions using a electron boost with electrons. The total dose was 50.56 Gy.   10/2019 - 10/2024 Anti-estrogen oral therapy   Tamoxifen      CHIEF COMPLIANT:   HISTORY OF PRESENT ILLNESS: Discussed the use of AI scribe software for clinical note transcription with the patient, who gave verbal consent to proceed.  History of Present Illness Kara [name] is a 58 year old female with breast cancer who presents for follow-up regarding her treatment and recent breast exam findings.  She has been on tamoxifen  since December 2020 following radiation therapy for breast cancer, taking 10 mg doses, with 20 mg tablets also provided. A recent breast exam revealed a palpable mass, leading to a recommendation for an ultrasound and mammogram. Previous exams have also detected masses.  She has mild anemia with a recent hemoglobin level of 11. She frequently feels cold, which she attributes to her anemia. Annual blood work through her primary care provider shows improvement in her  anemia compared to previous years.     ALLERGIES:  is allergic to imitrex [sumatriptan],  hydrocil [psyllium], and other.  MEDICATIONS:  Current Outpatient Medications  Medication Sig Dispense Refill   amphetamine -dextroamphetamine (ADDERALL XR) 20 MG 24 hr capsule Take 1 capsule (20 mg total) by mouth daily. 30 capsule 0   Cholecalciferol (VITAMIN D3) 125 MCG (5000 UT) CAPS Take 5,000 capsules by mouth.     clindamycin  (CLEOCIN  T) 1 % lotion APPLY A SMALL AMOUNT TO SKIN TWICE A DAY AS NEEDED     eszopiclone  (LUNESTA ) 2 MG TABS tablet Take 1 tablet (2 mg total) by mouth at bedtime as needed for sleep. Take immediately before bedtime 30 tablet 0   hydrochlorothiazide  (HYDRODIURIL ) 25 MG tablet Take 25 mg by mouth daily.     ibuprofen  (ADVIL ) 800 MG tablet Take 800 mg by mouth as needed.     Multiple Vitamins-Minerals (HAIR SKIN AND NAILS FORMULA PO) Take as directed     potassium chloride  (KLOR-CON ) 10 MEQ tablet Take 1 tablet (10 mEq total) by mouth daily. 90 tablet 1   Semaglutide -Weight Management (WEGOVY ) 1 MG/0.5ML SOAJ Inject 1 mg into the skin once a week. 2 mL    tamoxifen  (NOLVADEX ) 10 MG tablet TAKE 1 TABLET BY MOUTH EVERY DAY 90 tablet 3   vitamin B-12 (CYANOCOBALAMIN ) 500 MCG tablet Take 500 mcg by mouth daily.     No current facility-administered medications for this visit.    PHYSICAL EXAMINATION: ECOG PERFORMANCE STATUS: 1 - Symptomatic but completely ambulatory  Vitals:   06/23/24 0800  BP: 118/70  Pulse: 68  Resp: 18  Temp: (!) 97.5 F (36.4 C)  SpO2: 98%   Filed Weights   06/23/24 0800  Weight: 159 lb 12.8 oz (72.5 kg)    Physical Exam MEASUREMENTS: Weight- 159.8. GENERAL: No acute distress, well developed, well nourished. BREAST: Breast with normal scar tissue.  (exam performed in the presence of a chaperone)  LABORATORY DATA:  I have reviewed the data as listed    Latest Ref Rng & Units 06/15/2022    8:21 AM 06/17/2021    8:15 AM 05/31/2021    3:28 PM  CMP  Glucose 70 - 99 mg/dL 93  92  99   BUN 6 - 20 mg/dL 12  12  12    Creatinine 0.44  - 1.00 mg/dL 9.21  9.11  9.17   Sodium 135 - 145 mmol/L 142  142  140   Potassium 3.5 - 5.1 mmol/L 3.6  3.2  3.8   Chloride 98 - 111 mmol/L 104  103  105   CO2 22 - 32 mmol/L 32  29  27   Calcium 8.9 - 10.3 mg/dL 9.3  9.3  9.2   Total Protein 6.5 - 8.1 g/dL 7.4  7.7  7.9   Total Bilirubin 0.3 - 1.2 mg/dL 0.4  0.4  <9.7   Alkaline Phos 38 - 126 U/L 42  51  55   AST 15 - 41 U/L 17  16  16    ALT 0 - 44 U/L 15  13  15      Lab Results  Component Value Date   WBC 3.5 (L) 06/15/2022   HGB 10.5 (L) 06/15/2022   HCT 30.5 (L) 06/15/2022   MCV 89.2 06/15/2022   PLT 388 06/15/2022   NEUTROABS 2.1 06/15/2022    ASSESSMENT & PLAN:  Malignant neoplasm of lower-inner quadrant of left breast in female, estrogen receptor positive (HCC) 04/23/2019: Left breast  biopsy: DCIS low-grade ER/PR positive 07/08/2019: Left lumpectomy: 0.2 cm DCIS, grade 1, margins negative status post bilateral mammoplasty 07/15/2019 08/18/2019-10/06/2019: Adjuvant radiation Current treatment: Tamoxifen  started December 2020 with a plan to treat for 5 years Genetics: Negative   Tamoxifen  toxicities: Fatigue:  tamoxifen  to 10 mg a day  Since completed 5 years of therapy we decided to discontinue tamoxifen  at this time.   Breast cancer surveillance: 1.  Breast exam 06/23/2024: Benign 2. mammogram 07/19/2023: Benign breast density category C Patient has another mammogram and ultrasound coming up to evaluate the left breast scar tissue which feels more nodular and lumpy.  Bone density 02/07/2024: T-score -1.3: Osteopenia: Recommend calcium vitamin D and weightbearing exercises    Elevated ferritin Lab review: 06/15/2022: Hemoglobin 10.5 08/18/2022: Ferritin 644, iron saturation 22%, TIBC 242 hemoglobin 11.3 09/01/2022: Ferritin 711 Most likely cause of elevated ferritin: Inflammation Patient has mild anemia hemoglobin between 11 to 11.5 g.  Also mild leukopenia which is possibly related to ethnicity.   Return to clinic on an  as-needed basis. Assessment & Plan Estrogen receptor positive malignant neoplasm of lower-inner quadrant of left breast, status post treatment Status post treatment for estrogen receptor positive malignant neoplasm of the lower-inner quadrant of the left breast. Currently on tamoxifen , planned to discontinue after current supply. Recent breast exam noted a mass, likely scar tissue or fat necrosis, not concerning for malignancy. Ultrasound and mammogram scheduled for further evaluation. - Discontinue tamoxifen  after current supply is finished by December. - Proceed with scheduled ultrasound and mammogram in October.  Persistent mild anemia Persistent mild anemia with hemoglobin at 11. White blood cell count slightly low, common and not concerning. No recent thyroid function tests noted, relevant given symptoms of feeling cold. - Request a full blood panel including thyroid function tests at the next primary care visit.      No orders of the defined types were placed in this encounter.  The patient has a good understanding of the overall plan. she agrees with it. she will call with any problems that may develop before the next visit here. Total time spent: 30 mins including face to face time and time spent for planning, charting and co-ordination of care   Naomi MARLA Chad, MD 06/23/24

## 2024-06-23 NOTE — Assessment & Plan Note (Signed)
Lab review: 06/15/2022: Hemoglobin 10.5 08/18/2022: Ferritin 644, iron saturation 22%, TIBC 242 hemoglobin 11.3 09/01/2022: Ferritin 711 Most likely cause of elevated ferritin: Inflammation

## 2024-06-23 NOTE — Assessment & Plan Note (Signed)
 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  Breast exam 06/23/2024: Benign 2. mammogram 07/19/2023: Benign breast density category C  Bone density 02/07/2024: T-score -1.3: Osteopenia: Recommend calcium vitamin D and weightbearing exercises   Return to clinic in 1 year for follow-up

## 2024-07-15 ENCOUNTER — Other Ambulatory Visit: Payer: Self-pay | Admitting: Obstetrics & Gynecology

## 2024-07-18 ENCOUNTER — Ambulatory Visit
Admission: RE | Admit: 2024-07-18 | Discharge: 2024-07-18 | Disposition: A | Discharge status: Home / Self Care | Source: Ambulatory Visit | Attending: Obstetrics & Gynecology | Admitting: Obstetrics & Gynecology

## 2024-07-18 ENCOUNTER — Ambulatory Visit

## 2024-07-18 DIAGNOSIS — N6459 Other signs and symptoms in breast: Secondary | ICD-10-CM

## 2024-07-18 DIAGNOSIS — R928 Other abnormal and inconclusive findings on diagnostic imaging of breast: Secondary | ICD-10-CM | POA: Diagnosis not present

## 2024-07-31 DIAGNOSIS — E782 Mixed hyperlipidemia: Secondary | ICD-10-CM | POA: Diagnosis not present

## 2024-07-31 DIAGNOSIS — R7989 Other specified abnormal findings of blood chemistry: Secondary | ICD-10-CM | POA: Diagnosis not present

## 2024-07-31 DIAGNOSIS — Z0000 Encounter for general adult medical examination without abnormal findings: Secondary | ICD-10-CM | POA: Diagnosis not present

## 2024-07-31 DIAGNOSIS — F411 Generalized anxiety disorder: Secondary | ICD-10-CM | POA: Diagnosis not present

## 2024-07-31 DIAGNOSIS — C50919 Malignant neoplasm of unspecified site of unspecified female breast: Secondary | ICD-10-CM | POA: Diagnosis not present

## 2024-07-31 DIAGNOSIS — I10 Essential (primary) hypertension: Secondary | ICD-10-CM | POA: Diagnosis not present

## 2024-07-31 DIAGNOSIS — E663 Overweight: Secondary | ICD-10-CM | POA: Diagnosis not present

## 2024-07-31 DIAGNOSIS — D649 Anemia, unspecified: Secondary | ICD-10-CM | POA: Diagnosis not present

## 2024-07-31 DIAGNOSIS — G43909 Migraine, unspecified, not intractable, without status migrainosus: Secondary | ICD-10-CM | POA: Diagnosis not present
